# Patient Record
Sex: Male | Born: 1965 | Race: White | Hispanic: No | Smoking: Current some day smoker
Health system: Southern US, Community
[De-identification: ages and names within clinical notes are randomized; demographics above are authoritative.]

## PROBLEM LIST (undated history)

## (undated) DIAGNOSIS — I209 Angina pectoris, unspecified: Secondary | ICD-10-CM

## (undated) DIAGNOSIS — F419 Anxiety disorder, unspecified: Secondary | ICD-10-CM

## (undated) DIAGNOSIS — I10 Essential (primary) hypertension: Secondary | ICD-10-CM

## (undated) DIAGNOSIS — E237 Disorder of pituitary gland, unspecified: Secondary | ICD-10-CM

## (undated) DIAGNOSIS — E785 Hyperlipidemia, unspecified: Secondary | ICD-10-CM

## (undated) DIAGNOSIS — F1021 Alcohol dependence, in remission: Secondary | ICD-10-CM

## (undated) DIAGNOSIS — F32A Depression, unspecified: Secondary | ICD-10-CM

## (undated) DIAGNOSIS — T7840XA Allergy, unspecified, initial encounter: Secondary | ICD-10-CM

## (undated) DIAGNOSIS — K648 Other hemorrhoids: Secondary | ICD-10-CM

## (undated) DIAGNOSIS — F329 Major depressive disorder, single episode, unspecified: Secondary | ICD-10-CM

## (undated) DIAGNOSIS — I219 Acute myocardial infarction, unspecified: Secondary | ICD-10-CM

## (undated) DIAGNOSIS — I2510 Atherosclerotic heart disease of native coronary artery without angina pectoris: Secondary | ICD-10-CM

## (undated) DIAGNOSIS — E079 Disorder of thyroid, unspecified: Secondary | ICD-10-CM

## (undated) DIAGNOSIS — J449 Chronic obstructive pulmonary disease, unspecified: Secondary | ICD-10-CM

## (undated) DIAGNOSIS — M1990 Unspecified osteoarthritis, unspecified site: Secondary | ICD-10-CM

## (undated) DIAGNOSIS — M87052 Idiopathic aseptic necrosis of left femur: Secondary | ICD-10-CM

## (undated) DIAGNOSIS — M87051 Idiopathic aseptic necrosis of right femur: Secondary | ICD-10-CM

## (undated) DIAGNOSIS — I252 Old myocardial infarction: Secondary | ICD-10-CM

## (undated) HISTORY — DX: Hyperlipidemia, unspecified: E78.5

## (undated) HISTORY — DX: Unspecified osteoarthritis, unspecified site: M19.90

## (undated) HISTORY — PX: TONSILLECTOMY: SUR1361

## (undated) HISTORY — DX: Alcohol dependence, in remission: F10.21

## (undated) HISTORY — DX: Other hemorrhoids: K64.8

## (undated) HISTORY — PX: CARDIAC CATHETERIZATION: SHX172

## (undated) HISTORY — DX: Atherosclerotic heart disease of native coronary artery without angina pectoris: I25.10

## (undated) HISTORY — PX: CORONARY STENT PLACEMENT: SHX1402

## (undated) HISTORY — DX: Old myocardial infarction: I25.2

## (undated) HISTORY — DX: Chronic obstructive pulmonary disease, unspecified: J44.9

## (undated) HISTORY — DX: Allergy, unspecified, initial encounter: T78.40XA

---

## 2013-03-29 DIAGNOSIS — Z91199 Patient's noncompliance with other medical treatment and regimen due to unspecified reason: Secondary | ICD-10-CM

## 2013-03-29 DIAGNOSIS — Z9119 Patient's noncompliance with other medical treatment and regimen: Secondary | ICD-10-CM | POA: Insufficient documentation

## 2013-03-29 HISTORY — DX: Patient's noncompliance with other medical treatment and regimen due to unspecified reason: Z91.199

## 2013-04-06 DIAGNOSIS — E039 Hypothyroidism, unspecified: Secondary | ICD-10-CM | POA: Insufficient documentation

## 2014-06-28 DIAGNOSIS — I10 Essential (primary) hypertension: Secondary | ICD-10-CM | POA: Insufficient documentation

## 2014-12-23 ENCOUNTER — Emergency Department (HOSPITAL_COMMUNITY)
Admission: EM | Admit: 2014-12-23 | Discharge: 2014-12-23 | Disposition: A | Discharge status: Home / Self Care | Payer: Self-pay | Attending: Emergency Medicine | Admitting: Emergency Medicine

## 2014-12-23 ENCOUNTER — Encounter (HOSPITAL_COMMUNITY): Payer: Self-pay | Admitting: Emergency Medicine

## 2014-12-23 DIAGNOSIS — Z8639 Personal history of other endocrine, nutritional and metabolic disease: Secondary | ICD-10-CM | POA: Insufficient documentation

## 2014-12-23 DIAGNOSIS — R079 Chest pain, unspecified: Secondary | ICD-10-CM | POA: Insufficient documentation

## 2014-12-23 DIAGNOSIS — I10 Essential (primary) hypertension: Secondary | ICD-10-CM | POA: Insufficient documentation

## 2014-12-23 DIAGNOSIS — I2510 Atherosclerotic heart disease of native coronary artery without angina pectoris: Secondary | ICD-10-CM | POA: Insufficient documentation

## 2014-12-23 DIAGNOSIS — R42 Dizziness and giddiness: Secondary | ICD-10-CM | POA: Insufficient documentation

## 2014-12-23 DIAGNOSIS — R55 Syncope and collapse: Secondary | ICD-10-CM | POA: Insufficient documentation

## 2014-12-23 DIAGNOSIS — R001 Bradycardia, unspecified: Secondary | ICD-10-CM | POA: Insufficient documentation

## 2014-12-23 DIAGNOSIS — Z720 Tobacco use: Secondary | ICD-10-CM | POA: Insufficient documentation

## 2014-12-23 DIAGNOSIS — Z9861 Coronary angioplasty status: Secondary | ICD-10-CM | POA: Insufficient documentation

## 2014-12-23 HISTORY — DX: Atherosclerotic heart disease of native coronary artery without angina pectoris: I25.10

## 2014-12-23 HISTORY — DX: Disorder of pituitary gland, unspecified: E23.7

## 2014-12-23 HISTORY — DX: Disorder of thyroid, unspecified: E07.9

## 2014-12-23 HISTORY — DX: Essential (primary) hypertension: I10

## 2014-12-23 LAB — I-STAT TROPONIN, ED
Troponin i, poc: 0 ng/mL (ref 0.00–0.08)
Troponin i, poc: 0 ng/mL (ref 0.00–0.08)

## 2014-12-23 LAB — CBC
HEMATOCRIT: 35.1 % — AB (ref 39.0–52.0)
HEMOGLOBIN: 12.3 g/dL — AB (ref 13.0–17.0)
MCH: 34.9 pg — AB (ref 26.0–34.0)
MCHC: 35 g/dL (ref 30.0–36.0)
MCV: 99.7 fL (ref 78.0–100.0)
Platelets: 169 10*3/uL (ref 150–400)
RBC: 3.52 MIL/uL — ABNORMAL LOW (ref 4.22–5.81)
RDW: 15.6 % — ABNORMAL HIGH (ref 11.5–15.5)
WBC: 5.2 10*3/uL (ref 4.0–10.5)

## 2014-12-23 LAB — I-STAT CHEM 8, ED
BUN: 18 mg/dL (ref 6–23)
CREATININE: 1.1 mg/dL (ref 0.50–1.35)
Calcium, Ion: 1.09 mmol/L — ABNORMAL LOW (ref 1.12–1.23)
Chloride: 102 mEq/L (ref 96–112)
Glucose, Bld: 91 mg/dL (ref 70–99)
HCT: 41 % (ref 39.0–52.0)
HEMOGLOBIN: 13.9 g/dL (ref 13.0–17.0)
Potassium: 3.3 mmol/L — ABNORMAL LOW (ref 3.5–5.1)
Sodium: 135 mmol/L (ref 135–145)
TCO2: 21 mmol/L (ref 0–100)

## 2014-12-23 MED ORDER — POTASSIUM CHLORIDE CRYS ER 20 MEQ PO TBCR
20.0000 meq | EXTENDED_RELEASE_TABLET | Freq: Once | ORAL | Status: AC
  Administered 2014-12-23: 20 meq via ORAL
  Filled 2014-12-23: qty 1

## 2014-12-23 NOTE — Discharge Instructions (Signed)
Near-Syncope Near-syncope (commonly known as near fainting) is sudden weakness, dizziness, or feeling like you might pass out. During an episode of near-syncope, you may also develop pale skin, have tunnel vision, or feel sick to your stomach (nauseous). Near-syncope may occur when getting up after sitting or while standing for a long time. It is caused by a sudden decrease in blood flow to the brain. This decrease can result from various causes or triggers, most of which are not serious. However, because near-syncope can sometimes be a sign of something serious, a medical evaluation is required. The specific cause is often not determined. HOME CARE INSTRUCTIONS  Monitor your condition for any changes. The following actions may help to alleviate any discomfort you are experiencing:  Have someone stay with you until you feel stable.  Lie down right away and prop your feet up if you start feeling like you might faint. Breathe deeply and steadily. Wait until all the symptoms have passed. Most of these episodes last only a few minutes. You may feel tired for several hours.   Drink enough fluids to keep your urine clear or pale yellow.   If you are taking blood pressure or heart medicine, get up slowly when seated or lying down. Take several minutes to sit and then stand. This can reduce dizziness.  Follow up with your health care provider as directed. SEEK IMMEDIATE MEDICAL CARE IF:   You have a severe headache.   You have unusual pain in the chest, abdomen, or back.   You are bleeding from the mouth or rectum, or you have black or tarry stool.   You have an irregular or very fast heartbeat.   You have repeated fainting or have seizure-like jerking during an episode.   You faint when sitting or lying down.   You have confusion.   You have difficulty walking.   You have severe weakness.   You have vision problems.  MAKE SURE YOU:   Understand these instructions.  Will  watch your condition.  Will get help right away if you are not doing well or get worse. Document Released: 12/14/2005 Document Revised: 12/19/2013 Document Reviewed: 05/19/2013 Exit[employer] Patient Information 2015 [employer]. This information is not intended to replace advice given to you by your health care provider. Make sure you discuss any questions you have with your health care provider.  

## 2014-12-23 NOTE — ED Provider Notes (Signed)
CSN: [id number]     Arrival date & time 12/23/14  4081 History   First MD Initiated Contact with Patient 12/23/14 [phone]     Chief Complaint  Patient presents with  . Near Syncope      Patient is a 48 y.o. male presenting with near-syncope. The history is provided by the patient. No language interpreter was used.  Near Syncope   Kurt [name] presents for evaluation of near syncope. He felt tired yesterday went to bed about 9:30 PM. He had difficulty sleeping. He then developed central chest pressure, dizziness, and felt like he would pass out as well as some nausea. He called EMS and was brought to the emergency department for further evaluation. He denies any shortness of breath. His chest pain is gone. No vomiting, no abdominal pain, no fevers. He has a history of MI with stents placed 10-12 years ago. He also has a history of high blood pressure. He reports feeling improved since EMS was called.  Past Medical History  Diagnosis Date  . Coronary artery disease   . Hypertension   . Pituitary abnormality   . Thyroid disease    Past Surgical History  Procedure Laterality Date  . Coronary stent placement     No family history on file. History  Substance Use Topics  . Smoking status: Current Some Day Smoker  . Smokeless tobacco: Not on file  . Alcohol Use: Yes    Review of Systems  Cardiovascular: Positive for near-syncope.  All other systems reviewed and are negative.     Allergies  Erythromycin and Peanut-containing drug products  Home Medications   Prior to Admission medications   Not on File   BP 105/57 mmHg  Pulse 50  Temp(Src) 97.8 F (36.6 C)  Resp 19  SpO2 97% Physical Exam  Constitutional: He is oriented to person, place, and time. He appears well-developed and well-nourished.  HENT:  Head: Normocephalic and atraumatic.  Cardiovascular: Regular rhythm.   No murmur heard. bradycardic  Pulmonary/Chest: Effort normal. No respiratory distress.   Abdominal: Soft. There is no tenderness. There is no rebound and no guarding.  Musculoskeletal: He exhibits no edema or tenderness.  Neurological: He is alert and oriented to person, place, and time.  Skin: Skin is warm and dry.  Psychiatric: He has a normal mood and affect. His behavior is normal.  Nursing note and vitals reviewed.   ED Course  Procedures (including critical care time) Labs Review Labs Reviewed  CBC - Abnormal; Notable for the following:    RBC 3.52 (*)    Hemoglobin 12.3 (*)    HCT 35.1 (*)    MCH 34.9 (*)    RDW 15.6 (*)    All other components within normal limits  I-STAT CHEM 8, ED - Abnormal; Notable for the following:    Potassium 3.3 (*)    Calcium, Ion 1.09 (*)    All other components within normal limits  I-STAT TROPOININ, ED  I-STAT TROPOININ, ED    Imaging Review No results found.   EKG Interpretation   Date/Time:  Sunday December 23 2014 04:47:50 EST Ventricular Rate:  49 PR Interval:  130 QRS Duration: 121 QT Interval:  560 QTC Calculation: 506 R Axis:   98 Text Interpretation:  Sinus bradycardia Nonspecific intraventricular  conduction delay Abnrm T, consider ischemia, anterolateral lds Confirmed  by Hazle Coca [phone]) on 12/23/2014 4:54:05 AM      MDM   Final diagnoses:  Near syncope  Patient here for evaluation of near syncope, episode chest pain resolved. Patient was hypotensive for EMS. Clinical picture is not consistent with ACS, there are no prior EKGs to compare. Patient has no chest pain in the emergency department. Doubt PE or AAA. Patient without respiratory symptoms, no abdominal pain or tenderness. On recheck patient does remark that he had recent diarrhea with 6 episodes of loose stools a day for the last week. Question some element of dehydration. Discussed with patient mild electrolyte abnormalities and recommend taking multivitamin and by mouth fluid hydration. Recommend holding blood pressure medication at this  time, with frequent blood pressure checks at home.  Discussed with patient importance of PCP follow-up and return precautions.    Quintella Reichert, MD 12/23/14 [phone]

## 2014-12-23 NOTE — ED Notes (Signed)
Per EMS: Pt coming from for near syncope. Reports he was was lying in bed, woke up feeling nauseated, walked out side but felt like he was going to faint. BP upon EMS arrival 80/60's. Received 749ml of NS and 8 mf of zofran en route. BP improved with fluid bolus. Pt currently Ax4, NAD at this time. Pt did initially feel nauseated and was pale.

## 2014-12-23 NOTE — ED Notes (Signed)
He is warm and dry. Only complains of being tired. States he has had diarrhea for the past few days and not sleeping well. Denies any pain at this time . Sinus brady on monitor.

## 2015-07-31 ENCOUNTER — Encounter: Payer: Self-pay | Admitting: Primary Care

## 2015-07-31 ENCOUNTER — Ambulatory Visit (INDEPENDENT_AMBULATORY_CARE_PROVIDER_SITE_OTHER): Payer: Managed Care, Other (non HMO) | Admitting: Primary Care

## 2015-07-31 ENCOUNTER — Encounter (INDEPENDENT_AMBULATORY_CARE_PROVIDER_SITE_OTHER): Payer: Self-pay

## 2015-07-31 VITALS — BP 126/76 | HR 74 | Temp 97.4°F | Ht 72.0 in | Wt 147.8 lb

## 2015-07-31 DIAGNOSIS — E349 Endocrine disorder, unspecified: Secondary | ICD-10-CM | POA: Insufficient documentation

## 2015-07-31 DIAGNOSIS — Z0000 Encounter for general adult medical examination without abnormal findings: Secondary | ICD-10-CM | POA: Insufficient documentation

## 2015-07-31 DIAGNOSIS — F419 Anxiety disorder, unspecified: Secondary | ICD-10-CM | POA: Insufficient documentation

## 2015-07-31 DIAGNOSIS — Z8639 Personal history of other endocrine, nutritional and metabolic disease: Secondary | ICD-10-CM | POA: Diagnosis not present

## 2015-07-31 DIAGNOSIS — I10 Essential (primary) hypertension: Secondary | ICD-10-CM

## 2015-07-31 DIAGNOSIS — Z0001 Encounter for general adult medical examination with abnormal findings: Secondary | ICD-10-CM | POA: Insufficient documentation

## 2015-07-31 DIAGNOSIS — F329 Major depressive disorder, single episode, unspecified: Secondary | ICD-10-CM

## 2015-07-31 DIAGNOSIS — M79605 Pain in left leg: Secondary | ICD-10-CM | POA: Diagnosis not present

## 2015-07-31 DIAGNOSIS — F32A Depression, unspecified: Secondary | ICD-10-CM | POA: Insufficient documentation

## 2015-07-31 DIAGNOSIS — E291 Testicular hypofunction: Secondary | ICD-10-CM | POA: Diagnosis not present

## 2015-07-31 MED ORDER — SERTRALINE HCL 50 MG PO TABS
50.0000 mg | ORAL_TABLET | Freq: Every day | ORAL | Status: DC
Start: 2015-07-31 — End: 2015-10-18

## 2015-07-31 NOTE — Assessment & Plan Note (Signed)
History of. Once managed on Zoloft, has not had in 90+ days due to rehab and inability to afford. PHQ-9 score of 15 today. Restarted Zoloft at 50 mg. 1/2 tab for 6 days, then 1 full tab therafter. Discussed possible side effects. He is to follow up next week for physical, then in 6 weeks.

## 2015-07-31 NOTE — Assessment & Plan Note (Signed)
Present to left groin and upper part of lower extremity x 4 months. Temporary relief with ibuprofen, ice, heat. He lifts heavy equipment for work. Referral made to PT for further evaluation.

## 2015-07-31 NOTE — Assessment & Plan Note (Signed)
Endorses history of and was managed on levothyroixine 175 mcg, but has not had in 90+ days. TSH check today. Will treat accordingly.

## 2015-07-31 NOTE — Assessment & Plan Note (Signed)
He is to obtain labs today and come in next week for a physical.

## 2015-07-31 NOTE — Progress Notes (Signed)
Subjective:    Patient ID: Kurt [name], male    DOB: September 25, 1966, 49 y.o.   MRN: [id number]  HPI  Mr. Durr is a 49 year old male who presents today to establish care and discuss the problems mentioned below. Will obtain old records. He was in rehab for alcohol abuse for 90 days and has not had his medications in at least 90 days.  1) Essential hypertension: History of. Was managed on lotrel 10/40mg  and lopressor 50mg  BID. Removed from his medication while in rehab. He's also lost 40 pounds in three months.  2) Hyperlipidemia: History of. Was managed on Zetia 10 mg daily and Fish Oil daily.  3) Hypothyroidism: History of. Once managed on Levothyroxine 175 mcg. He's been out of his medication for 90 days due to insurance purposes and is experiencing weight loss, fatigue.  4) Heart disease: History of myocardial infarction in 1990's with 2 STENT placements. He does not currently follow with cardiology.  5) Depression: Diagnosed years ago. He was managed on Zoloft for years and has been out of his medications for 90+ days. PHQ-9 score of 15 in clinic today. He feels depressed, denies SI/HI.  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   7) Leg pain: Present to left leg near groin for the past 4 month. He was walking up stairs one afternoon and noticed a tear. He continues to experience pain when moving his leg up and walking up stairs. He works for a Manufacturing systems engineer. He's been taking ibuprofen, icing, placing heat without consistent relief.  Review of Systems  Constitutional: Positive for unexpected weight change.  HENT: Negative for rhinorrhea.   Respiratory: Negative for cough and shortness of breath.   Cardiovascular: Negative for chest pain.  Gastrointestinal: Negative for diarrhea and constipation.  Genitourinary: Negative for difficulty  urinating.  Musculoskeletal: Positive for arthralgias.       Present to fingers, knees, elbows.  Skin: Negative for rash.  Allergic/Immunologic: Positive for environmental allergies.       Spring and Fall.  Neurological: Negative for dizziness and headaches.  Psychiatric/Behavioral:       See HPI       Past Medical History  Diagnosis Date  . Coronary artery disease   . Hypertension   . Pituitary abnormality   . Thyroid disease   . 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History   Social History  . Marital Status: Legally Separated    Spouse Name: N/A  . Number of Children: N/A  . Years of Education: N/A   Occupational History  . Not on file.   Social History Main Topics  . Smoking status: Former Research scientist (life sciences)  . Smokeless tobacco: Not on file  . Alcohol Use: No  . Drug Use: Not on file  . Sexual Activity: Not on file   Other Topics Concern  . Not on file   Social History Narrative   Separated.   Works as a Optician, dispensing.   Enjoys fishing, Proofreader, golfing.       Past Surgical History  Procedure Laterality Date  . Coronary stent placement      No family history on file.  Allergies  Allergen Reactions  . Peanut-Containing Drug Products Anaphylaxis  . Erythromycin Other (See Comments)    Childhood allergy     No current outpatient prescriptions on file  prior to visit.   No current facility-administered medications on file prior to visit.    BP 126/76 mmHg  Pulse 74  Temp(Src) 97.4 F (36.3 C) (Oral)  Ht 6' (1.829 m)  Wt 147 lb 12.8 oz (67.042 kg)  BMI 20.04 kg/m2  SpO2 98%    Objective:   Physical Exam  Constitutional: He is oriented to person, place, and time. He appears well-nourished.  HENT:  Head: Normocephalic.  Cardiovascular: Normal rate and regular rhythm.   Pulmonary/Chest: Effort normal and breath sounds normal.  Musculoskeletal: He exhibits no edema.  Pain to left groin/upper part of lower extremity with flexion of  left leg. Difficulty stepping up on exam table.  Neurological: He is alert and oriented to person, place, and time.  Skin: Skin is warm and dry.  Psychiatric: He has a normal mood and affect.          Assessment & Plan:

## 2015-07-31 NOTE — Assessment & Plan Note (Signed)
Endorses history of. Will obtain testosterone lab today. Will refer to urology PRN.

## 2015-07-31 NOTE — Patient Instructions (Signed)
Complete lab work prior to leaving today. I will notify you of your results.  Please schedule a physical with me next week at your convienence. We will discuss your lab results during your physical.  You will be contacted regarding your referral to Physical Therapy.  Please let us know if you have not heard back within one week.   Start Zoloft 50 mg tablets for depression. Take 1/2 tablet by mouth daily for 6 days, then advance to 1 full tablet there after.  It was a pleasure to meet you today! Please don't hesitate to call me with any questions. Welcome to [facility]!

## 2015-07-31 NOTE — Progress Notes (Signed)
Pre visit review using our clinic review tool, if applicable. No additional management support is needed unless otherwise documented below in the visit note. 

## 2015-08-01 ENCOUNTER — Other Ambulatory Visit: Payer: Self-pay | Admitting: Primary Care

## 2015-08-01 ENCOUNTER — Telehealth: Payer: Self-pay | Admitting: Primary Care

## 2015-08-01 ENCOUNTER — Encounter: Payer: Self-pay | Admitting: *Deleted

## 2015-08-01 DIAGNOSIS — E349 Endocrine disorder, unspecified: Secondary | ICD-10-CM

## 2015-08-01 LAB — LIPID PANEL
Cholesterol: 160 mg/dL (ref 0–200)
HDL: 68.8 mg/dL (ref >39.00)
LDL Cholesterol: 75 mg/dL (ref 0–99)
NonHDL: 91.47
Total CHOL/HDL Ratio: 2
Triglycerides: 81 mg/dL (ref 0.0–149.0)
VLDL: 16.2 mg/dL (ref 0.0–40.0)

## 2015-08-01 LAB — COMPREHENSIVE METABOLIC PANEL
ALBUMIN: 3.8 g/dL (ref 3.5–5.2)
ALK PHOS: 69 U/L (ref 39–117)
ALT: 16 U/L (ref 0–53)
AST: 31 U/L (ref 0–37)
BUN: 14 mg/dL (ref 6–23)
CO2: 28 mEq/L (ref 19–32)
CREATININE: 0.84 mg/dL (ref 0.40–1.50)
Calcium: 9.2 mg/dL (ref 8.4–10.5)
Chloride: 99 mEq/L (ref 96–112)
GFR: 102.47 mL/min (ref >60.00)
Glucose, Bld: 59 mg/dL — ABNORMAL LOW (ref 70–99)
Potassium: 3.8 mEq/L (ref 3.5–5.1)
SODIUM: 135 meq/L (ref 135–145)
Total Bilirubin: 0.2 mg/dL (ref 0.2–1.2)
Total Protein: 7.3 g/dL (ref 6.0–8.3)

## 2015-08-01 LAB — TSH: TSH: 2.29 u[IU]/mL (ref 0.35–4.50)

## 2015-08-01 LAB — TESTOSTERONE: Testosterone: 177.05 ng/dL — ABNORMAL LOW (ref 300.00–890.00)

## 2015-08-01 LAB — HEMOGLOBIN A1C: HEMOGLOBIN A1C: 5.5 % (ref 4.6–6.5)

## 2015-08-01 NOTE — Telephone Encounter (Signed)
Called and notified patient of Kate's comments. Patient verbalized understanding.  

## 2015-08-01 NOTE — Telephone Encounter (Signed)
Pt needs work note for missing work yesterday.  Also pt is inquiring to see if labs are ready. Call cell number if any questions.Pt states he will stop by at 430 to pick up note  Thanks

## 2015-08-07 ENCOUNTER — Ambulatory Visit (INDEPENDENT_AMBULATORY_CARE_PROVIDER_SITE_OTHER): Payer: Managed Care, Other (non HMO) | Admitting: Primary Care

## 2015-08-07 ENCOUNTER — Encounter: Payer: Self-pay | Admitting: Primary Care

## 2015-08-07 VITALS — BP 136/84 | HR 62 | Temp 98.4°F | Ht 72.0 in | Wt 149.1 lb

## 2015-08-07 DIAGNOSIS — M79605 Pain in left leg: Secondary | ICD-10-CM | POA: Diagnosis not present

## 2015-08-07 DIAGNOSIS — E039 Hypothyroidism, unspecified: Secondary | ICD-10-CM | POA: Diagnosis not present

## 2015-08-07 DIAGNOSIS — Z0000 Encounter for general adult medical examination without abnormal findings: Secondary | ICD-10-CM

## 2015-08-07 DIAGNOSIS — E291 Testicular hypofunction: Secondary | ICD-10-CM | POA: Diagnosis not present

## 2015-08-07 DIAGNOSIS — E349 Endocrine disorder, unspecified: Secondary | ICD-10-CM

## 2015-08-07 NOTE — Progress Notes (Signed)
Subjective:    Patient ID: Kurt [name], male    DOB: 06-Oct-1966, 49 y.o.   MRN: [id number]  HPI  Kurt [name] is a 49 year old male who presents today for complete physical.  Immunizations: -Tetanus: Completed in 2011. -Influenza: Did not receive last season -Pneumonia: Pneumovax received in 2009   Diet: Endorses a fair diet. Breakfast: Biscuit, waffles. Lunch: Sandwich, banana Dinner: Meat, vegetable, starch. Cooks meals at home. Limited dessert eater. Beverages: Drinks water, gatorade. He is worried about his weight loss, as he has lost 30 pounds in the last several months. He has a weight gain of 2 pounds since last visit and is wearing the same type of clothing. Exercise: Active at work, does not regularly exercise. Eye exam: Last completed December 2015. Had glaucoma testing which was negative. Dental exam: Has not completed. Colonoscopy: Last completed 6 years ago. Had some polyps removed which were benign.   Wt Readings from Last 3 Encounters:  08/07/15 149 lb 1.9 oz (67.64 kg)  07/31/15 147 lb 12.8 oz (67.042 kg)    Prior smoker for 35 years. Smoked 2 PPD.  Quit in January 2016.  Review of Systems  Constitutional: Negative for unexpected weight change.  HENT: Negative for rhinorrhea.   Respiratory: Negative for shortness of breath.   Cardiovascular: Negative for chest pain.  Gastrointestinal: Negative for diarrhea and constipation.  Genitourinary: Negative for difficulty urinating.       Low testosterone   Musculoskeletal: Positive for myalgias. Negative for arthralgias.       Improvement in his leg pain  Skin: Negative for rash.  Neurological: Negative for dizziness and headaches.  Psychiatric/Behavioral:       Currently being treated for depression.       Past Medical History  Diagnosis Date  . Coronary artery disease   . Hypertension   . Pituitary abnormality   . Thyroid disease   . 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Social History    Social History  . Marital Status: Legally Separated    Spouse Name: N/A  . Number of Children: N/A  . Years of Education: N/A   Occupational History  . Not on file.   Social History Main Topics  . Smoking status: Former Research scientist (life sciences)  . Smokeless tobacco: Not on file  . Alcohol Use: No  . Drug Use: Not on file  . Sexual Activity: Not on file   Other Topics Concern  . Not on file   Social History Narrative   Separated.   Works as a Optician, dispensing.   Enjoys fishing, Proofreader, golfing.       Past Surgical History  Procedure Laterality Date  . Coronary stent placement      No family history on file.  Allergies  Allergen Reactions  . Peanut-Containing Drug Products Anaphylaxis  . Erythromycin Other (See Comments)    Childhood allergy     Current Outpatient Prescriptions on File Prior to Visit  Medication Sig Dispense Refill  . sertraline (ZOLOFT) 50 MG tablet Take 1 tablet (50 mg total) by mouth daily. 30 tablet 3   No current facility-administered medications on file prior to visit.    BP 136/84 mmHg  Pulse 62  Temp(Src) 98.4 F (36.9 C) (Oral)  Ht 6' (1.829 m)  Wt 149 lb 1.9 oz (67.64 kg)  BMI 20.22 kg/m2  SpO2 98%    Objective:   Physical Exam  Constitutional: He is oriented to person, place, and time. He appears  well-nourished.  HENT:  Right Ear: Tympanic membrane and ear canal normal.  Left Ear: Tympanic membrane and ear canal normal.  Nose: Nose normal.  Mouth/Throat: Oropharynx is clear and moist.  Eyes: Conjunctivae and EOM are normal. Pupils are equal, round, and reactive to light.  Neck: Neck supple. No thyromegaly present.  Cardiovascular: Normal rate and regular rhythm.   Pulmonary/Chest: Effort normal and breath sounds normal.  Abdominal: Soft. Bowel sounds are normal. There is no tenderness.  Musculoskeletal: Normal range of motion.  Lymphadenopathy:    He has no cervical adenopathy.  Neurological: He is alert and oriented to  person, place, and time. He has normal reflexes. No cranial nerve deficit.  Skin: Skin is warm and dry.  Psychiatric: He has a normal mood and affect.          Assessment & Plan:

## 2015-08-07 NOTE — Progress Notes (Signed)
Pre visit review using our clinic review tool, if applicable. No additional management support is needed unless otherwise documented below in the visit note. 

## 2015-08-07 NOTE — Assessment & Plan Note (Signed)
Tetanus and pneumonia UTD. Labs mostly unremarkable. Testosterone levels low, will refer to urology for further evaluation. Exam unremarkable. Colonoscopy completed 6 years ago through [facility].  Will continue to monitor weight as he's lost 30 pounds in several months.

## 2015-08-07 NOTE — Assessment & Plan Note (Signed)
TSH is within normal range. No thyroidmegly noted on exam.

## 2015-08-07 NOTE — Patient Instructions (Signed)
Stop by the front and speak with Rosaria Ferries regarding your referral to Urology.  Follow up in 2 months for re-evaluation of depression and weight.  It was a pleasure to see you today!

## 2015-08-07 NOTE — Assessment & Plan Note (Signed)
Improved with supportive measures

## 2015-08-07 NOTE — Assessment & Plan Note (Signed)
Testosterone level of 177 at 4pm.  He was not available to do an AM catch. Will send to urology for further evaluation.

## 2015-08-13 ENCOUNTER — Ambulatory Visit: Payer: Self-pay | Admitting: Primary Care

## 2015-09-12 ENCOUNTER — Encounter: Payer: Self-pay | Admitting: Family Medicine

## 2015-09-12 ENCOUNTER — Encounter: Payer: Self-pay | Admitting: Primary Care

## 2015-09-12 ENCOUNTER — Ambulatory Visit (INDEPENDENT_AMBULATORY_CARE_PROVIDER_SITE_OTHER)
Admission: RE | Admit: 2015-09-12 | Discharge: 2015-09-12 | Disposition: A | Discharge status: Home / Self Care | Payer: Managed Care, Other (non HMO) | Source: Ambulatory Visit

## 2015-09-12 ENCOUNTER — Ambulatory Visit
Admission: RE | Admit: 2015-09-12 | Discharge: 2015-09-12 | Disposition: A | Discharge status: Home / Self Care | Payer: Managed Care, Other (non HMO) | Source: Ambulatory Visit | Attending: Family Medicine | Admitting: Family Medicine

## 2015-09-12 ENCOUNTER — Ambulatory Visit (INDEPENDENT_AMBULATORY_CARE_PROVIDER_SITE_OTHER): Payer: Managed Care, Other (non HMO) | Admitting: Primary Care

## 2015-09-12 ENCOUNTER — Ambulatory Visit (INDEPENDENT_AMBULATORY_CARE_PROVIDER_SITE_OTHER): Payer: Managed Care, Other (non HMO) | Admitting: Family Medicine

## 2015-09-12 VITALS — BP 111/74 | HR 95 | Temp 98.9°F | Ht 72.0 in | Wt 150.5 lb

## 2015-09-12 VITALS — BP 146/92 | HR 72 | Temp 98.6°F | Ht 72.0 in | Wt 148.0 lb

## 2015-09-12 DIAGNOSIS — M79606 Pain in leg, unspecified: Secondary | ICD-10-CM

## 2015-09-12 DIAGNOSIS — M25551 Pain in right hip: Secondary | ICD-10-CM

## 2015-09-12 DIAGNOSIS — M87052 Idiopathic aseptic necrosis of left femur: Secondary | ICD-10-CM

## 2015-09-12 DIAGNOSIS — M25552 Pain in left hip: Principal | ICD-10-CM

## 2015-09-12 DIAGNOSIS — M87051 Idiopathic aseptic necrosis of right femur: Secondary | ICD-10-CM | POA: Diagnosis not present

## 2015-09-12 DIAGNOSIS — Z125 Encounter for screening for malignant neoplasm of prostate: Secondary | ICD-10-CM

## 2015-09-12 DIAGNOSIS — M79605 Pain in left leg: Secondary | ICD-10-CM

## 2015-09-12 DIAGNOSIS — M2550 Pain in unspecified joint: Secondary | ICD-10-CM

## 2015-09-12 DIAGNOSIS — M6250 Muscle wasting and atrophy, not elsewhere classified, unspecified site: Secondary | ICD-10-CM

## 2015-09-12 DIAGNOSIS — I2510 Atherosclerotic heart disease of native coronary artery without angina pectoris: Secondary | ICD-10-CM

## 2015-09-12 DIAGNOSIS — M533 Sacrococcygeal disorders, not elsewhere classified: Secondary | ICD-10-CM | POA: Diagnosis not present

## 2015-09-12 DIAGNOSIS — R937 Abnormal findings on diagnostic imaging of other parts of musculoskeletal system: Secondary | ICD-10-CM

## 2015-09-12 DIAGNOSIS — R29898 Other symptoms and signs involving the musculoskeletal system: Secondary | ICD-10-CM

## 2015-09-12 DIAGNOSIS — I252 Old myocardial infarction: Secondary | ICD-10-CM

## 2015-09-12 DIAGNOSIS — R634 Abnormal weight loss: Secondary | ICD-10-CM

## 2015-09-12 HISTORY — DX: Old myocardial infarction: I25.2

## 2015-09-12 HISTORY — DX: Atherosclerotic heart disease of native coronary artery without angina pectoris: I25.10

## 2015-09-12 MED ORDER — PREDNISONE 20 MG PO TABS: ORAL_TABLET | ORAL | Status: DC

## 2015-09-12 NOTE — Assessment & Plan Note (Signed)
Present bilaterally now to upper inner thighs. Suspect tendonitis as he does heavy lifting daily during occupation. He was to follow up with PT but cancelled his appointment. He is adamant about seeing a specialist today or tomorrow. RX for prednisone provided to assist with discomfort. He was placed on Dr. Lillie Fragmin schedule later this afternoon, case discussed with Dr. Lorelei Pont.

## 2015-09-12 NOTE — Progress Notes (Signed)
Dr. Frederico Hamman T. Vanity Larsson, MD, [facility] [place], [address] Phone: [phone] Fax: [phone]  09/12/2015  Patient: Kurt [name], MRN: [id number], DOB: 19-Aug-1966, 49 y.o.  Primary Physician:  Sheral Flow, NP  Chief Complaint: Leg Pain  Subjective:   Dear Mrs. Clark:  Thank you for having me see Kurt [name] in consultation today at [facility] at [facility] for his problem with bilateral hip pain.  As you may recall, he is a 49 y.o. year old male with a history of a 35 pound weight loss in approximately the last year with progressively worsening left-sided true groin pain for at least the last 6 months.  On the right side, the patient has started to feel some traumatic pain within the last couple of weeks that is been extensive.  It has limited his ability to work over the last few days.  Historically, this is been a very strong gentleman, and he at only 6 feet tall could easily dunk basketball, and he would play competitive basketball within a few years ago and was quite active all of the time.  Historically, he has been a laborer, and he works in a Associate Professor yard where he routinely picks up greater than 100 pounds at a time in [employer].  There is no explanation for why the patient has lost 35 pounds and it is entirely unintentional.   He is seen a number of doctors here locally as well as in [place].  I do have some [facility] well as [facility], which were reviewed.  He is a smoker, and he has had a recent chest x-ray which was normal with the exception of some COPD type changes.  I do not have all of his other screening data, and we will have to discuss this with the patient's PCP to ensure care coordination, and he just turned 49 years old.   Hurts if rotating his hip at all in the true groin.  No numbness or tingling.  Minimal quad firing   Wt Readings from Last 3  Encounters:  09/12/15 150 lb 8 oz (68.266 kg)  09/12/15 148 lb (67.132 kg)  08/07/15 149 lb 1.9 oz (67.64 kg)    35 pounds, 2013 data from [facility] shows a weight in the 180's.   Patient Active Problem List   Diagnosis Date Noted  . History of MI (myocardial infarction) 09/12/2015  . Coronary disease 09/12/2015  . Depression 07/31/2015  . Hypothyroidism 07/31/2015  . Testosterone deficiency 07/31/2015  . Pain of left lower extremity 07/31/2015    Past Medical History  Diagnosis Date  . Coronary artery disease   . Hypertension   . Pituitary abnormality   . Thyroid disease   . 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. History of MI (myocardial infarction) 09/12/2015  . Coronary disease 09/12/2015    Past Surgical History  Procedure Laterality Date  . Coronary stent placement      Social History   Social History  . Marital Status: Legally Separated    Spouse Name: N/A  . Number of Children: N/A  . Years of Education: N/A   Occupational History  . Not on file.   Social History Main Topics  . Smoking status: Former Research scientist (life sciences)  . Smokeless tobacco: Never Used  . Alcohol Use: No  . Drug Use: Not on file  . Sexual Activity: Not on file   Other Topics Concern  .  Not on file   Social History Narrative   Separated.   Works as a Advertising account planner.   Enjoys fishing, Publishing copy, golfing.       No family history on file.  Allergies  Allergen Reactions  . Peanut-Containing Drug Products Anaphylaxis  . Erythromycin Other (See Comments)    Childhood allergy     Medication list reviewed and updated in full in [facility].   GEN: No fevers, chills. Nontoxic. Primarily MSK c/o today. 35 pound weight loss.  MSK: Detailed in the HPI GI: tolerating PO intake without difficulty Neuro: No numbness, parasthesias, or tingling associated. Otherwise, the pertinent positives and negatives are listed above and in the HPI, otherwise a full review of systems has been  reviewed and is negative unless noted positive.   Objective:   BP 111/74 mmHg  Pulse 95  Temp(Src) 98.9 F (37.2 C) (Oral)  Ht 6' (1.829 m)  Wt 150 lb 8 oz (68.266 kg)  BMI 20.41 kg/m2    GEN: WDWN, NAD, Non-toxic, Alert & Oriented x 3 HEENT: Atraumatic, Normocephalic.  Ears and Nose: No external deformity. CV: RRR, no m/g/r  PULM: Normal respiratory rate, no accessory muscle use. No wheezes, crackles or rhonchi  ABD: S, NT, ND, + BS, No rebound, No HSM  EXTR: No clubbing/cyanosis/edema NEURO: ANTALGIC GAIT, WALKING "STILT-LIKE" PSYCH: Normally interactive. Conversant. Not depressed or anxious appearing.  Calm demeanor.   HIP EXAM: SIDE: B ROM: Abduction, Flexion, Internal and External range of motion: the patient has approximately 30-35 of rotational movement on both sides.  Abduction is only moderately limited, but all of these are limited by terminal pain. Pain with terminal IROM and EROM: as above GTB: NT SLR: NEG Knees: No effusion FABER: cannot complete due to pain REVERSE FABER: NT, neg Str: flexion: 2/5 abduction: 3/5 adduction: 3/5 Strength testing non-tender   there is a massive amount of quadriceps wasting on both sides, significantly worse on the left.  There is minimal muscle left on the left side and is dramatically smaller compared to the patient's Size.  The patient cannot rise from a seated position without using his upper extremity to elevate him out of the chair.  The upper body appears to take virtually all of his weight.  At the knee, the patient has 4 out of 5 extension and 4 out of 5 flexion. At the ankle there is preserved 5/5 plantar and dorsiflexion and normal great toe extension.  From a sensory standpoint, there is no deficit. DP and PT pulses are intact. 2+  Radiology:  Dg Hip Unilat With Pelvis 2-3 Views Left  09/13/2015   CLINICAL DATA:  Hip pain.  No known injury.  Initial evaluation.  EXAM: DG HIP (WITH OR WITHOUT PELVIS) 2-3V LEFT   COMPARISON:  None.  FINDINGS: Degenerative changes lumbar spine and both hips. Mild increased density noted left femoral head. Avascular necrosis cannot be completely excluded. Similar findings to a lesser degree noted about the right femoral head. Aortoiliac atherosclerotic vascular calcification. Pelvic phleboliths.  IMPRESSION: 1. No evidence of fracture or dislocation.  2. Mild increased density about the left femoral head. Similar findings to a lesser degree noted about the right femoral head . Avascular necrosis cannot be excluded. MRI of the hips can be obtained for further evaluation.  3.  Aortoiliac atherosclerotic vascular disease.   Electronically Signed   By: Maisie Fus  Register   On: 09/13/2015 09:17   Dg Hip Unilat With Pelvis 2-3 Views Right  09/13/2015   CLINICAL DATA:  Bilateral hip pain. No known injury. Works lifting heavy [employer].  EXAM: DG HIP (WITH OR WITHOUT PELVIS) 2-3V RIGHT  COMPARISON:  None.  FINDINGS: There is no evidence of hip fracture or dislocation. There is no evidence of arthropathy or other focal bone abnormality.  IMPRESSION: Negative.   Electronically Signed   By: Rolm Baptise M.D.   On: 09/13/2015 09:16   [facility] CLINICAL DATA:  Fever, leg pain for several weeks now worsening.  EXAM: LEFT FEMUR 2 VIEWS  COMPARISON:  None.  FINDINGS: Slipped no acute fracture deformity or dislocation. Distal femur metaphyseal enchondroma versus bone infarct. No suspicious bony lesions. Mild vascular calcifications. Coarse calcification in the medial femoral soft tissues most consistent with myositis ossificans.  IMPRESSION: No acute osseous process.   Electronically Signed   By: Elon Alas M.D.   On: 07/29/2015 22:13  [facility] DATA CLINICAL DATA:  Fever, leg pain for several weeks now worsening.  EXAM: CHEST  2 VIEW  COMPARISON:  None.  FINDINGS: Cardiomediastinal silhouette is unremarkable, status post extent coronary artery stenting.  Mild chronic interstitial changes, increased lung volumes dominant pleural effusion or focal consolidation. No pneumothorax. Soft tissue planes and included osseous structures are nonsuspicious.  IMPRESSION: Findings of COPD without superimposed acute cardiopulmonary process.   Electronically Signed   By: Elon Alas M.D.   On: 07/29/2015 22:15  Results for orders placed or performed in visit on 09/12/15  ANA  Result Value Ref Range   Anit Nuclear Antibody(ANA) NEG NEGATIVE  Cyclic citrul peptide antibody, IgG  Result Value Ref Range   Cyclic Citrullin Peptide Ab 23 (H) Units  Rheumatoid factor  Result Value Ref Range   Rhuematoid fact SerPl-aCnc <10 <=14 IU/mL  High sensitivity CRP  Result Value Ref Range   CRP, High Sensitivity 94.380 (H) 0.000 - 5.000 mg/L  Sedimentation rate  Result Value Ref Range   Sed Rate 47 (H) 0 - 22 mm/hr  Hepatitis C antibody  Result Value Ref Range   HCV Ab NEGATIVE NEGATIVE  HIV antibody  Result Value Ref Range   HIV 1&2 Ab, 4th Generation NONREACTIVE NONREACTIVE  PSA, total and free  Result Value Ref Range   PSA 0.34 <=4.00 ng/mL   PSA, Free 0.20 ng/mL   PSA, Free Pct 59 >25 %     Assessment and Plan:   Avascular necrosis of bone of hip, left  Avascular necrosis of bone of hip, right  SI (sacroiliac) joint dysfunction  Hip pain, bilateral - Plan: DG HIP UNILAT WITH PELVIS 2-3 VIEWS RIGHT, DG HIP UNILAT WITH PELVIS 2-3 VIEWS LEFT, ANA, Cyclic citrul peptide antibody, IgG, Rheumatoid factor, High sensitivity CRP, Sedimentation rate  Polyarthralgia - Plan: ANA, Cyclic citrul peptide antibody, IgG, Rheumatoid factor, High sensitivity CRP, Sedimentation rate, Hepatitis C antibody, HIV antibody  Loss of weight - Plan: ANA, Cyclic citrul peptide antibody, IgG, Rheumatoid factor, High sensitivity CRP, Sedimentation rate, Hepatitis C antibody, HIV antibody  History of MI (myocardial infarction)  Leg weakness, bilateral - Plan:  ANA, Cyclic citrul peptide antibody, IgG, Rheumatoid factor, High sensitivity CRP, Sedimentation rate  Muscle wasting  Special screening for malignant neoplasm of prostate - Plan: PSA, total and free   Level of medical complexity is high  Highly concerning history.  35 pound weight loss.  The patient is essentially unable to fire his quadricep musculature and hip flexor musculature and his hip stabilizers now, and he is binding his upper  legs with power lifting type wraps to assist his altered ambulation.  Bilateral hip x-rays show sclerotic changes more likely consistent with bilateral avascular necrosis of the femoral head.  Additionally, I discussed this case with one of our musculoskeletal radiologists, and he felt like there was some sclerotic change around the left-sided pelvis near the SI joint as well.  To determine if this gentleman has indeed bilateral avascular necrosis of each femoral head, we will obtain an MRI of the bony pelvis to evaluate both hips as well as his pelvis for avascular necrosis.  PSA is negative.  Chest x-ray is negative.  Smoker.  35 pound weight loss, neoplastic disease would be considered in the differential.  Neuromuscular disease or wasting disorder would be also in the differential, but much less likely given XR findings. This may be explained simply by bilateral avascular necrosis for months. Patient also has a ESR of 47 and CRP of 94. Diffuse polyarthralgia, majority of Rheumatological studies normal with mildly elevated CCP AB, markedly elevated ESR and CRP likely explained by above. If symptoms persist in future, further eval could be undertaken.   I discussed this case and follow-up plan of care with PCP face to face.   Follow-up: will depend upon additional studies  Orders Placed This Encounter  Procedures  . DG HIP UNILAT WITH PELVIS 2-3 VIEWS RIGHT  . DG HIP UNILAT WITH PELVIS 2-3 VIEWS LEFT  . ANA  . Cyclic citrul peptide antibody, IgG  .  Rheumatoid factor  . High sensitivity CRP  . Sedimentation rate  . Hepatitis C antibody  . HIV antibody  . PSA, total and free    We will see the patient back determined by further testing.   Thank you for having [place] see Kurt [name] in consultation.  Feel free to contact me with any questions.  Signed,  Maud Deed. Alydia Gosser, MD   Patient's Medications  New Prescriptions   No medications on file  Previous Medications   PREDNISONE (DELTASONE) 20 MG TABLET    Take 3 tablets by mouth daily for 3 days, then 2 tablets by mouth daily for 3 days, then 1 tablet by mouth daily for 3 days.   SERTRALINE (ZOLOFT) 50 MG TABLET    Take 1 tablet (50 mg total) by mouth daily.  Modified Medications   No medications on file  Discontinued Medications   No medications on file

## 2015-09-12 NOTE — Progress Notes (Signed)
Pre visit review using our clinic review tool, if applicable. No additional management support is needed unless otherwise documented below in the visit note. 

## 2015-09-12 NOTE — Progress Notes (Signed)
   Subjective:    Patient ID: Kurt [name], male    DOB: 1966-05-13, 49 y.o.   MRN: [id number]  HPI  Kurt [name] is a 49 year old male who presents today with a chief complaint of leg pain. His pain is present to bilateral upper portion of lower extremities, near groin. He describes his pain as a "knife" that's cutting through his legs. He's been taking tylenol/motrin with temporarily decrease in pain. This pain has been on going on since March/April this year. Pain is worst with lifting heavy boxes at work and is constant. Denies back pain, numbness/tingling in lower extremities. He does have cramping to the plantar surface of his feet. He's not been able to work yesterday and today and would like immediate evaluation with a specialist. He was scheduled for PT in early August but cancelled his appointment.  Wt Readings from Last 3 Encounters:  09/12/15 148 lb (67.132 kg)  08/07/15 149 lb 1.9 oz (67.64 kg)  07/31/15 147 lb 12.8 oz (67.042 kg)     Review of Systems  Respiratory: Negative for shortness of breath.   Cardiovascular: Negative for chest pain.  Musculoskeletal: Positive for myalgias and arthralgias.  Skin: Negative for rash.  Neurological: Negative for dizziness, weakness and numbness.       Past Medical History  Diagnosis Date  . Coronary artery disease   . Hypertension   . Pituitary abnormality   . Thyroid disease   . 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Social History   Social History  . Marital Status: Legally Separated    Spouse Name: N/A  . Number of Children: N/A  . Years of Education: N/A   Occupational History  . Not on file.   Social History Main Topics  . Smoking status: Former Research scientist (life sciences)  . Smokeless tobacco: Not on file  . Alcohol Use: No  . Drug Use: Not on file  . Sexual Activity: Not on file   Other Topics Concern  . Not on file   Social History Narrative   Separated.   Works as a Optician, dispensing.   Enjoys fishing, Proofreader,  golfing.       Past Surgical History  Procedure Laterality Date  . Coronary stent placement      No family history on file.  Allergies  Allergen Reactions  . Peanut-Containing Drug Products Anaphylaxis  . Erythromycin Other (See Comments)    Childhood allergy     Current Outpatient Prescriptions on File Prior to Visit  Medication Sig Dispense Refill  . sertraline (ZOLOFT) 50 MG tablet Take 1 tablet (50 mg total) by mouth daily. 30 tablet 3   No current facility-administered medications on file prior to visit.    BP 146/92 mmHg  Pulse 72  Temp(Src) 98.6 F (37 C) (Oral)  Ht 6' (1.829 m)  Wt 148 lb (67.132 kg)  BMI 20.07 kg/m2  SpO2 98%    Objective:   Physical Exam  Constitutional: He appears well-nourished.  Cardiovascular: Normal rate and regular rhythm.   Pulmonary/Chest: Effort normal and breath sounds normal.  Musculoskeletal:  Decreased ROM and pain to bilateral medial thighs upon lateral rotation of lower extremity during flexion while supine. Non tender. No obvious deformity.          Assessment & Plan:

## 2015-09-12 NOTE — Patient Instructions (Signed)
Start Prednisone tablets for leg pain. Take 3 tablets by mouth daily for 3 days, then 2 tablet daily for 3 days, then 1 tablet for 3 days.  See Dr. Lorelei Pont at 5:45 pm this afternoon as scheduled.   It's important that you are scheduled for physical therapy. Please talk with Dr. Lorelei Pont and let me know what you guys decide.  It was a pleasure to see you today!

## 2015-09-13 ENCOUNTER — Telehealth: Payer: Self-pay | Admitting: Primary Care

## 2015-09-13 LAB — HIGH SENSITIVITY CRP: CRP HIGH SENSITIVITY: 94.38 mg/L — AB (ref 0.000–5.000)

## 2015-09-13 LAB — SEDIMENTATION RATE: Sed Rate: 47 mm/hr — ABNORMAL HIGH (ref 0–22)

## 2015-09-13 LAB — RHEUMATOID FACTOR

## 2015-09-13 NOTE — Telephone Encounter (Signed)
Pt was seen by Dr Lorelei Pont last night, said there was nothing wrong with his legs.  He thinks it is ALS or MS and to follow up with you.   cb number is [phone] Pt is extremely worried and would like call back as soon as possible

## 2015-09-13 NOTE — Telephone Encounter (Signed)
Attempted to return call, no answer, left voicemail . Will attempt again at the end of the day.

## 2015-09-13 NOTE — Telephone Encounter (Signed)
Pt called again, wondering if kate got a chance to look at message sent. He can be reached at the phone number [phone].

## 2015-09-14 LAB — PSA, TOTAL AND FREE
PSA FREE: 0.2 ng/mL
PSA, Free Pct: 59 % (ref >25)
PSA: 0.34 ng/mL (ref <=4.00)

## 2015-09-14 LAB — HEPATITIS C ANTIBODY: HCV Ab: NEGATIVE

## 2015-09-14 LAB — HIV ANTIBODY (ROUTINE TESTING W REFLEX): HIV: NONREACTIVE

## 2015-09-16 ENCOUNTER — Telehealth: Payer: Self-pay | Admitting: Primary Care

## 2015-09-16 LAB — CYCLIC CITRUL PEPTIDE ANTIBODY, IGG: CYCLIC CITRULLIN PEPTIDE AB: 23 U — AB

## 2015-09-16 LAB — ANA: Anti Nuclear Antibody(ANA): NEGATIVE

## 2015-09-16 NOTE — Telephone Encounter (Signed)
Discussed case with Dr. Lorelei Pont who will be in contact with patient.

## 2015-09-16 NOTE — Telephone Encounter (Signed)
Please notify Mr. Smolenski that Dr. Lorelei Pont will be in touch with him in the near future. Thanks.

## 2015-09-16 NOTE — Telephone Encounter (Signed)
Patient returned Kate's call.  Patient said he's out of work and will be available all afternoon.  Patient can be reached at [phone].

## 2015-09-16 NOTE — Telephone Encounter (Signed)
Called and notified patient of Kurt [name]'s comments. Patient verbalized understanding.  

## 2015-09-24 ENCOUNTER — Ambulatory Visit
Admission: RE | Admit: 2015-09-24 | Discharge: 2015-09-24 | Disposition: A | Discharge status: Home / Self Care | Payer: Managed Care, Other (non HMO) | Source: Ambulatory Visit | Attending: Family Medicine | Admitting: Family Medicine

## 2015-09-24 DIAGNOSIS — M25552 Pain in left hip: Secondary | ICD-10-CM | POA: Diagnosis present

## 2015-09-24 DIAGNOSIS — M87052 Idiopathic aseptic necrosis of left femur: Secondary | ICD-10-CM | POA: Diagnosis not present

## 2015-09-24 DIAGNOSIS — R937 Abnormal findings on diagnostic imaging of other parts of musculoskeletal system: Secondary | ICD-10-CM

## 2015-09-24 DIAGNOSIS — M533 Sacrococcygeal disorders, not elsewhere classified: Secondary | ICD-10-CM

## 2015-09-24 DIAGNOSIS — M25551 Pain in right hip: Secondary | ICD-10-CM | POA: Diagnosis present

## 2015-09-24 DIAGNOSIS — M87051 Idiopathic aseptic necrosis of right femur: Secondary | ICD-10-CM | POA: Diagnosis not present

## 2015-09-25 ENCOUNTER — Telehealth: Payer: Self-pay | Admitting: *Deleted

## 2015-09-25 ENCOUNTER — Other Ambulatory Visit: Payer: Self-pay | Admitting: Family Medicine

## 2015-09-25 DIAGNOSIS — M87052 Idiopathic aseptic necrosis of left femur: Principal | ICD-10-CM

## 2015-09-25 DIAGNOSIS — M87051 Idiopathic aseptic necrosis of right femur: Secondary | ICD-10-CM

## 2015-09-25 MED ORDER — ACETAMINOPHEN-CODEINE #3 300-30 MG PO TABS: 1.0000 | ORAL_TABLET | Freq: Four times a day (QID) | ORAL | Status: DC | PRN

## 2015-09-25 NOTE — Telephone Encounter (Signed)
Tylenol #3 called into [facility]

## 2015-09-25 NOTE — Telephone Encounter (Signed)
-----   Message from Owens Loffler, MD sent at 09/25/2015  8:43 AM EDT ----- Please call in  Tylenol #3, 1-2 po q 6 hours prn pain, #40, 0 refills

## 2015-09-26 ENCOUNTER — Other Ambulatory Visit: Payer: Self-pay | Admitting: Primary Care

## 2015-09-26 DIAGNOSIS — Z87891 Personal history of nicotine dependence: Secondary | ICD-10-CM

## 2015-09-26 DIAGNOSIS — R634 Abnormal weight loss: Secondary | ICD-10-CM

## 2015-09-27 NOTE — Telephone Encounter (Signed)
Pt request status of tylenol # 3 to [facility] [address]; spoke with Christy at [facility] and rx ready for pickup. Pt voiced understanding.

## 2015-09-30 ENCOUNTER — Telehealth: Payer: Self-pay | Admitting: Primary Care

## 2015-09-30 DIAGNOSIS — I252 Old myocardial infarction: Secondary | ICD-10-CM

## 2015-09-30 NOTE — Telephone Encounter (Signed)
Referral made to cardiology for surgical clearance.

## 2015-09-30 NOTE — Telephone Encounter (Signed)
Patient called and said he needs a Cardiac clearance for hip replacement surgery by [facility].  Patient said he saw a cardiologist in [place] several years ago, but he wants to be referred to a Arts administrator in [place].  He can go after 3:30 on Monday - Friday.

## 2015-10-03 ENCOUNTER — Ambulatory Visit
Admission: RE | Admit: 2015-10-03 | Discharge: 2015-10-03 | Disposition: A | Discharge status: Home / Self Care | Payer: Managed Care, Other (non HMO) | Source: Ambulatory Visit | Attending: Primary Care | Admitting: Primary Care

## 2015-10-03 DIAGNOSIS — R911 Solitary pulmonary nodule: Secondary | ICD-10-CM | POA: Insufficient documentation

## 2015-10-03 DIAGNOSIS — J432 Centrilobular emphysema: Secondary | ICD-10-CM | POA: Insufficient documentation

## 2015-10-03 DIAGNOSIS — Z87891 Personal history of nicotine dependence: Secondary | ICD-10-CM | POA: Insufficient documentation

## 2015-10-03 DIAGNOSIS — R634 Abnormal weight loss: Secondary | ICD-10-CM | POA: Diagnosis present

## 2015-10-03 MED ORDER — IOHEXOL 300 MG/ML  SOLN
75.0000 mL | Freq: Once | INTRAMUSCULAR | Status: AC | PRN
Start: 2015-10-03 — End: 2015-10-03
  Administered 2015-10-03: 75 mL via INTRAVENOUS

## 2015-10-04 ENCOUNTER — Telehealth: Payer: Self-pay

## 2015-10-04 NOTE — Telephone Encounter (Signed)
Allie Bossier NP said Dr Lorelei Pont is handling pain mgt and note forwarded to Dr Lorelei Pont.

## 2015-10-04 NOTE — Telephone Encounter (Signed)
This would be reasonable until the patient's surgery. He should not need any pain medication long-term. Upcoming hip surgery should be expected.   Please let him know that you can't call in stronger pain medication, but I will write for some on Monday morning.   Electronically Signed  By: Owens Loffler, MD On: 10/04/2015 4:59 PM

## 2015-10-04 NOTE — Telephone Encounter (Signed)
He will be contacted soon

## 2015-10-04 NOTE — Telephone Encounter (Signed)
Hagan notified as instructed by telephone.

## 2015-10-04 NOTE — Telephone Encounter (Signed)
Pt left v/m requesting cb when available with CT scan of chest done on 10/03/15.

## 2015-10-04 NOTE — Telephone Encounter (Signed)
Pt left v/m; pain med Tylenol # 3 helps but does not give complete relief from pain and pt wants to know if there is another option for pain mgt. Pt request cb.

## 2015-10-07 ENCOUNTER — Telehealth: Payer: Self-pay | Admitting: Primary Care

## 2015-10-07 MED ORDER — HYDROCODONE-ACETAMINOPHEN 5-325 MG PO TABS: 1.0000 | ORAL_TABLET | Freq: Four times a day (QID) | ORAL | Status: DC | PRN

## 2015-10-07 NOTE — Telephone Encounter (Signed)
Patient returned Chan's call.  Patient asked me to give him the results of his CT Chest.  I let him know what Anda Kraft said in the results.

## 2015-10-07 NOTE — Telephone Encounter (Signed)
See result note of CT.

## 2015-10-07 NOTE — Telephone Encounter (Signed)
Left message for Mr. Hardenbrook that his prescription is ready to be picked up at the front desk.

## 2015-10-08 ENCOUNTER — Ambulatory Visit: Payer: Managed Care, Other (non HMO) | Admitting: Primary Care

## 2015-10-09 ENCOUNTER — Telehealth: Payer: Self-pay | Admitting: Primary Care

## 2015-10-09 NOTE — Telephone Encounter (Signed)
What questions does he have specifically? I believe Dr. Lorelei Pont sent in some pain medication, is this what he's referring to?

## 2015-10-09 NOTE — Telephone Encounter (Signed)
Called and ask him what kind of questions does he have.  First, he wanted to see if he can get a referral to urology again as soon as possible. Patient lives with his father and the card that was mailed to his home did not have Sr. or Jr. on the it. So the patient's father assume it was his and call then cancel the appointment by mistake. Patient did not realized it until after. I went ahead and added Junior for patient in our system but it will need to be fix if he is going to the same urology office that he was first referral to.  Second, he is very concern about his weight. He is losing weight and right now, he is around 139.lbs. He is worry about this and how it would impact him with the upcoming surgery.  Third, he has been having diarrhea for the past 4 days. He has no energy and loss of appetite. He had been running a fever as well. He also have been having cramps and felt like it is in all his joints. All this had made him very concerns.

## 2015-10-09 NOTE — Telephone Encounter (Signed)
Pt stopped by to pick up an RX and wanted to know if someone could call him please. He has a lot of questions about his current condition. CB number [phone]

## 2015-10-10 ENCOUNTER — Telehealth: Payer: Self-pay | Admitting: Primary Care

## 2015-10-10 ENCOUNTER — Other Ambulatory Visit: Payer: Self-pay | Admitting: Primary Care

## 2015-10-10 DIAGNOSIS — E349 Endocrine disorder, unspecified: Secondary | ICD-10-CM

## 2015-10-10 NOTE — Telephone Encounter (Signed)
Spoke with patient regarding concerns. 

## 2015-10-18 ENCOUNTER — Ambulatory Visit (INDEPENDENT_AMBULATORY_CARE_PROVIDER_SITE_OTHER): Payer: Managed Care, Other (non HMO) | Admitting: Internal Medicine

## 2015-10-18 ENCOUNTER — Encounter: Payer: Self-pay | Admitting: Internal Medicine

## 2015-10-18 VITALS — BP 116/74 | HR 60 | Ht 72.0 in | Wt 148.2 lb

## 2015-10-18 DIAGNOSIS — I252 Old myocardial infarction: Secondary | ICD-10-CM

## 2015-10-18 DIAGNOSIS — Z01810 Encounter for preprocedural cardiovascular examination: Secondary | ICD-10-CM | POA: Diagnosis not present

## 2015-10-18 DIAGNOSIS — I2510 Atherosclerotic heart disease of native coronary artery without angina pectoris: Secondary | ICD-10-CM | POA: Diagnosis not present

## 2015-10-18 DIAGNOSIS — E785 Hyperlipidemia, unspecified: Secondary | ICD-10-CM

## 2015-10-18 DIAGNOSIS — I2583 Coronary atherosclerosis due to lipid rich plaque: Secondary | ICD-10-CM

## 2015-10-18 NOTE — Patient Instructions (Signed)
Your physician recommends that you schedule a follow-up appointment in: with Dr. Debara Pickett as needed.

## 2015-10-20 ENCOUNTER — Encounter: Payer: Self-pay | Admitting: Internal Medicine

## 2015-10-20 ENCOUNTER — Other Ambulatory Visit: Payer: Self-pay | Admitting: Internal Medicine

## 2015-10-20 DIAGNOSIS — E785 Hyperlipidemia, unspecified: Secondary | ICD-10-CM | POA: Insufficient documentation

## 2015-10-20 DIAGNOSIS — F1020 Alcohol dependence, uncomplicated: Secondary | ICD-10-CM | POA: Insufficient documentation

## 2015-10-20 DIAGNOSIS — I2510 Atherosclerotic heart disease of native coronary artery without angina pectoris: Secondary | ICD-10-CM | POA: Insufficient documentation

## 2015-10-20 DIAGNOSIS — F419 Anxiety disorder, unspecified: Secondary | ICD-10-CM | POA: Insufficient documentation

## 2015-10-20 NOTE — Progress Notes (Signed)
OFFICE NOTE  Chief Complaint:  Preoperative cardiovascular risk assessment  Primary Care Physician: Sheral Flow, NP  HPI:  Kurt [name]. is a pleasant 49 year old male who has a history of remote MI approximately 10 years ago in 2006 at [facility]. He received 2 overlapping stents, per his report. An echo was performed in 2009 which showed an EF of 55-60% with no wall motion abnormalities. A stress test at that time showed mild LV dilatation and EF of 48% but no definite reversible ischemia. It does not sound like he's had much cardiac follow-up since that time. He is currently on no cardiovascular medications including no aspirin. He denies any recent angina or worsening shortness of breath. He's recently worked on weight loss and in fact has remained fairly active. He does a lot of physical labor and says he is asymptomatic during that. He is reportedly going to have hip replacement by Dr. Percell Miller. He's here today for cardiovascular clearance.  PMHx:  Past Medical History  Diagnosis Date  . Coronary artery disease   . Hypertension   . 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 Thyroid disease   . 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([place])   . History of MI (myocardial infarction) 09/12/2015  . Coronary disease 09/12/2015  . Hyperlipidemia     Past Surgical History  Procedure Laterality Date  . Coronary stent placement      FAMHx:  Family History  Problem Relation Age of Onset  . Cancer Mother   . Dementia Father   . Cancer Maternal Grandmother   . Cancer Maternal Grandfather   . Cancer Paternal Grandmother   . Cancer Paternal Grandfather     SOCHx:   reports that he has quit smoking. He has never used smokeless tobacco. He reports that he does not drink alcohol. His drug history is not on file.  ALLERGIES:  Allergies  Allergen Reactions  . Peanut-Containing Drug Products Anaphylaxis  . Erythromycin Other (See Comments)    Childhood allergy     ROS: A  comprehensive review of systems was negative except for: Musculoskeletal: positive for Hip pain  HOME MEDS: Current Outpatient Prescriptions  Medication Sig Dispense Refill  . HYDROcodone-acetaminophen (NORCO/VICODIN) 5-325 MG tablet Take 1 tablet by mouth every 6 (six) hours as needed for moderate pain. 50 tablet 0   No current facility-administered medications for this visit.    LABS/IMAGING: No results found for this or any previous visit (from the past 48 hour(s)). No results found.  WEIGHTS: Wt Readings from Last 3 Encounters:  10/18/15 148 lb 3.2 oz (67.223 kg)  09/24/15 150 lb (68.04 kg)  09/12/15 150 lb 8 oz (68.266 kg)    VITALS: BP 116/74 mmHg  Pulse 60  Ht 6' (1.829 m)  Wt 148 lb 3.2 oz (67.223 kg)  BMI 20.10 kg/m2  EXAM: General appearance: alert and no distress Neck: no carotid bruit and no JVD Lungs: clear to auscultation bilaterally Heart: regular rate and rhythm, S1, S2 normal, no murmur, click, rub or gallop Abdomen: soft, non-tender; bowel sounds normal; no masses,  no organomegaly Extremities: extremities normal, atraumatic, no cyanosis or edema Pulses: 2+ and symmetric Skin: Skin color, texture, turgor normal. No rashes or lesions Neurologic: Grossly normal Psych: Pleasant  EKG: Normal sinus rhythm at 60, incomplete left bundle branch block  ASSESSMENT: 1. Coronary artery disease status post MI in 2006 with 2 overlapping stents 2. History of hypertension and dyslipidemia-not on medications 3. Documented medical noncompliance from [facility] 4.  Low risk for upcoming surgery  PLAN: 1.   Kurt [name] presents today for preoperative cardiac vascular risk assessment. He had a very remote MI about 10 years ago with 2 overlapping stents to an yet unknown vessel. He is physically active and denies any chest pain or worsening shortness of breath. He can easily do more than 4 metabolic equivalents of activity. He should be low risk for hip replacement  surgery. We discussed ongoing treatment and medications for coronary artery disease and the importance of medications to reduce his risk of future heart attacks. At minimum, I advised that he would start low-dose aspirin on a daily basis after his surgery. He does seem to have well-controlled blood pressure and heart rate at this time. It's hard to argue for the addition of beta blocker or ACE inhibitor, and one could consider at least a low-dose beta blocker perioperatively however his heart rate is around 60. Overall I think he is low risk for surgery. I'm happy to see him annually in follow-up.  Thanks for the kind referral.  Pixie Casino, MD, [facility] Attending Cardiologist [place] 10/20/2015, 12:21 PM

## 2015-11-12 ENCOUNTER — Other Ambulatory Visit: Payer: Self-pay | Admitting: Orthopedic Surgery

## 2015-11-13 ENCOUNTER — Other Ambulatory Visit: Payer: Self-pay

## 2015-11-13 NOTE — Telephone Encounter (Signed)
Pt left v/m requesting refill of a med (did not leave name) and wants to get hip surgery scheduled sooner due to hip popping out of joint. Left v/m for pt to cb.

## 2015-11-14 NOTE — Telephone Encounter (Signed)
Pt left v/m requesting status of med refill (no name left). Spoke with pt and he request rx for hydrocodone apap. Pt is out of med. Last printed # 50 on 10/07/15. Pt last seen 09/12/15.

## 2015-11-15 ENCOUNTER — Inpatient Hospital Stay (HOSPITAL_COMMUNITY)
Admission: RE | Admit: 2015-11-15 | Discharge: 2015-11-15 | Disposition: A | Discharge status: Home / Self Care | Payer: Managed Care, Other (non HMO) | Source: Ambulatory Visit

## 2015-11-15 MED ORDER — HYDROCODONE-ACETAMINOPHEN 5-325 MG PO TABS: 1.0000 | ORAL_TABLET | Freq: Four times a day (QID) | ORAL | Status: DC | PRN

## 2015-11-15 NOTE — Progress Notes (Addendum)
Attempted to contact Kurt [name] with out success. Called his work number, they state he left at 1100 today due to pain. Message left for him to call [facility] Moores or Baker Janus to reschedule his PAT appointment.

## 2015-11-15 NOTE — Telephone Encounter (Signed)
RX printed and signed and given to CS 

## 2015-11-15 NOTE — Pre-Procedure Instructions (Signed)
Kolvin Sargeant.  11/15/2015      [facility] [address], [place] - [address] [place] [address] Phone: [phone] Fax: [phone]    Your procedure is scheduled on Nov 29  Report to [facility] at 530 A.M.  Call this number if you have problems the morning of surgery:  [phone]   Remember:  Do not eat food or drink liquids after midnight.  Take these medicines the morning of surgery with A SIP OF WATER   Stop taking aspirin, ibuprofen, Aleve, Herbal medications, Fish Oil, BC's, Goody's   Do not wear jewelry, make-up or nail polish.  Do not wear lotions, powders, or perfumes.  You may wear deodorant.  Do not shave 48 hours prior to surgery.  Men may shave face and neck.  Do not bring valuables to the hospital.  [facility] is not responsible for any belongings or valuables.  Contacts, dentures or bridgework may not be worn into surgery.  Leave your suitcase in the car.  After surgery it may be brought to your room.  For patients admitted to the hospital, discharge time will be determined by your treatment team.  Patients discharged the day of surgery will not be allowed to drive home.    Special instructions:  [place] - Preparing for Surgery  Before surgery, you can play an important role.  Because skin is not sterile, your skin needs to be as free of germs as possible.  You can reduce the number of germs on you skin by washing with CHG (chlorahexidine gluconate) soap before surgery.  CHG is an antiseptic cleaner which kills germs and bonds with the skin to continue killing germs even after washing.  Please DO NOT use if you have an allergy to CHG or antibacterial soaps.  If your skin becomes reddened/irritated stop using the CHG and inform your nurse when you arrive at Short Stay.  Do not shave (including legs and underarms) for at least 48 hours prior to the first CHG shower.  You may shave your  face.  Please follow these instructions carefully:   1.  Shower with CHG Soap the night before surgery and the    morning of Surgery.  2.  If you choose to wash your hair, wash your hair first as usual with your normal shampoo.  3.  After you shampoo, rinse your hair and body thoroughly to remove the  Shampoo.  4.  Use CHG as you would any other liquid soap.  You can apply chg directly to the skin and wash gently with scrungie or a clean washcloth.  5.  Apply the CHG Soap to your body ONLY FROM THE NECK DOWN.        Do not use on open wounds or open sores.  Avoid contact with your eyes,ears, mouth and genitals (private parts).  Wash genitals (private parts)  with your normal soap.  6.  Wash thoroughly, paying special attention to the area where your surgery   will be performed.  7.  Thoroughly rinse your body with warm water from the neck down.  8.  DO NOT shower/wash with your normal soap after using and rinsing off  the CHG Soap.  9.  Pat yourself dry with a clean towel.            10.  Wear clean pajamas.            11.  Place clean  sheets on your bed the night of your first shower and do not sleep with pets.  Day of Surgery  Do not apply any lotions/deoderants the morning of surgery.  Please wear clean clothes to the hospital/surgery center.     Please read over the following fact sheets that you were given. Pain Booklet, Coughing and Deep Breathing, MRSA Information and Surgical Site Infection Prevention

## 2015-11-15 NOTE — Telephone Encounter (Signed)
Called patient and notified him that Rx is ready for pick. Left in the front office. Patient verbalized understanding.

## 2015-11-15 NOTE — Progress Notes (Signed)
Not here for PAT appointment. Attempted to call his cell number, no answer.

## 2015-11-20 ENCOUNTER — Other Ambulatory Visit: Payer: Self-pay | Admitting: Orthopedic Surgery

## 2015-11-20 ENCOUNTER — Encounter (HOSPITAL_COMMUNITY): Payer: Self-pay

## 2015-11-20 ENCOUNTER — Encounter (HOSPITAL_COMMUNITY)
Admission: RE | Admit: 2015-11-20 | Discharge: 2015-11-20 | Disposition: A | Discharge status: Home / Self Care | Payer: Managed Care, Other (non HMO) | Source: Ambulatory Visit | Attending: Orthopedic Surgery | Admitting: Orthopedic Surgery

## 2015-11-20 DIAGNOSIS — Z87891 Personal history of nicotine dependence: Secondary | ICD-10-CM | POA: Insufficient documentation

## 2015-11-20 DIAGNOSIS — Z955 Presence of coronary angioplasty implant and graft: Secondary | ICD-10-CM | POA: Diagnosis not present

## 2015-11-20 DIAGNOSIS — I252 Old myocardial infarction: Secondary | ICD-10-CM | POA: Insufficient documentation

## 2015-11-20 DIAGNOSIS — E039 Hypothyroidism, unspecified: Secondary | ICD-10-CM | POA: Diagnosis not present

## 2015-11-20 DIAGNOSIS — E785 Hyperlipidemia, unspecified: Secondary | ICD-10-CM | POA: Insufficient documentation

## 2015-11-20 DIAGNOSIS — I10 Essential (primary) hypertension: Secondary | ICD-10-CM | POA: Insufficient documentation

## 2015-11-20 DIAGNOSIS — Z01812 Encounter for preprocedural laboratory examination: Secondary | ICD-10-CM | POA: Diagnosis not present

## 2015-11-20 DIAGNOSIS — I2510 Atherosclerotic heart disease of native coronary artery without angina pectoris: Secondary | ICD-10-CM | POA: Diagnosis not present

## 2015-11-20 DIAGNOSIS — M1611 Unilateral primary osteoarthritis, right hip: Secondary | ICD-10-CM | POA: Insufficient documentation

## 2015-11-20 DIAGNOSIS — Z01818 Encounter for other preprocedural examination: Secondary | ICD-10-CM | POA: Diagnosis present

## 2015-11-20 LAB — CBC
HEMATOCRIT: 41.1 % (ref 39.0–52.0)
HEMOGLOBIN: 14.1 g/dL (ref 13.0–17.0)
MCH: 33.9 pg (ref 26.0–34.0)
MCHC: 34.3 g/dL (ref 30.0–36.0)
MCV: 98.8 fL (ref 78.0–100.0)
Platelets: 224 10*3/uL (ref 150–400)
RBC: 4.16 MIL/uL — ABNORMAL LOW (ref 4.22–5.81)
RDW: 15.3 % (ref 11.5–15.5)
WBC: 7.6 10*3/uL (ref 4.0–10.5)

## 2015-11-20 LAB — COMPREHENSIVE METABOLIC PANEL
ALK PHOS: 62 U/L (ref 38–126)
ALT: 24 U/L (ref 17–63)
ANION GAP: 8 (ref 5–15)
AST: 34 U/L (ref 15–41)
Albumin: 3.7 g/dL (ref 3.5–5.0)
BILIRUBIN TOTAL: 0.6 mg/dL (ref 0.3–1.2)
BUN: 18 mg/dL (ref 6–20)
CALCIUM: 9.6 mg/dL (ref 8.9–10.3)
CO2: 27 mmol/L (ref 22–32)
Chloride: 101 mmol/L (ref 101–111)
Creatinine, Ser: 0.8 mg/dL (ref 0.61–1.24)
Glucose, Bld: 93 mg/dL (ref 65–99)
POTASSIUM: 4.2 mmol/L (ref 3.5–5.1)
Sodium: 136 mmol/L (ref 135–145)
TOTAL PROTEIN: 7.2 g/dL (ref 6.5–8.1)

## 2015-11-20 LAB — SURGICAL PCR SCREEN
MRSA, PCR: NEGATIVE
Staphylococcus aureus: NEGATIVE

## 2015-11-20 NOTE — Progress Notes (Signed)
Left message at Dr. Mardelle Matte office requesting new consent order. Original consent order was for incorrect procedure.

## 2015-11-20 NOTE — Progress Notes (Signed)
Patient denies chest pain, shortness of breath. Cardiologist is Dr. Debara Pickett last [place] noted in [place]. PCP Carlis Abbott, NP Belenda Cruise.

## 2015-11-25 ENCOUNTER — Inpatient Hospital Stay
Admission: EM | Admit: 2015-11-25 | Discharge: 2015-11-26 | DRG: 303 | Disposition: A | Discharge status: Home / Self Care | Payer: Managed Care, Other (non HMO) | Attending: Internal Medicine | Admitting: Internal Medicine

## 2015-11-25 ENCOUNTER — Other Ambulatory Visit: Payer: Self-pay

## 2015-11-25 ENCOUNTER — Emergency Department: Payer: Managed Care, Other (non HMO)

## 2015-11-25 ENCOUNTER — Encounter (HOSPITAL_COMMUNITY): Payer: Self-pay | Admitting: Vascular Surgery

## 2015-11-25 ENCOUNTER — Encounter: Payer: Self-pay | Admitting: Emergency Medicine

## 2015-11-25 DIAGNOSIS — R079 Chest pain, unspecified: Secondary | ICD-10-CM | POA: Diagnosis not present

## 2015-11-25 DIAGNOSIS — I429 Cardiomyopathy, unspecified: Secondary | ICD-10-CM | POA: Diagnosis present

## 2015-11-25 DIAGNOSIS — F1010 Alcohol abuse, uncomplicated: Secondary | ICD-10-CM | POA: Diagnosis present

## 2015-11-25 DIAGNOSIS — I10 Essential (primary) hypertension: Secondary | ICD-10-CM | POA: Diagnosis present

## 2015-11-25 DIAGNOSIS — Z716 Tobacco abuse counseling: Secondary | ICD-10-CM

## 2015-11-25 DIAGNOSIS — I252 Old myocardial infarction: Secondary | ICD-10-CM

## 2015-11-25 DIAGNOSIS — Z809 Family history of malignant neoplasm, unspecified: Secondary | ICD-10-CM | POA: Diagnosis not present

## 2015-11-25 DIAGNOSIS — Z79899 Other long term (current) drug therapy: Secondary | ICD-10-CM | POA: Diagnosis not present

## 2015-11-25 DIAGNOSIS — I25110 Atherosclerotic heart disease of native coronary artery with unstable angina pectoris: Secondary | ICD-10-CM | POA: Diagnosis present

## 2015-11-25 DIAGNOSIS — Z955 Presence of coronary angioplasty implant and graft: Secondary | ICD-10-CM | POA: Diagnosis not present

## 2015-11-25 DIAGNOSIS — I200 Unstable angina: Secondary | ICD-10-CM | POA: Diagnosis not present

## 2015-11-25 DIAGNOSIS — F17210 Nicotine dependence, cigarettes, uncomplicated: Secondary | ICD-10-CM | POA: Diagnosis present

## 2015-11-25 DIAGNOSIS — Z9889 Other specified postprocedural states: Secondary | ICD-10-CM

## 2015-11-25 DIAGNOSIS — F419 Anxiety disorder, unspecified: Secondary | ICD-10-CM | POA: Diagnosis present

## 2015-11-25 DIAGNOSIS — E785 Hyperlipidemia, unspecified: Secondary | ICD-10-CM | POA: Diagnosis present

## 2015-11-25 DIAGNOSIS — Z888 Allergy status to other drugs, medicaments and biological substances status: Secondary | ICD-10-CM

## 2015-11-25 LAB — BASIC METABOLIC PANEL
ANION GAP: 11 (ref 5–15)
BUN: 9 mg/dL (ref 6–20)
CHLORIDE: 99 mmol/L — AB (ref 101–111)
CO2: 25 mmol/L (ref 22–32)
Calcium: 9 mg/dL (ref 8.9–10.3)
Creatinine, Ser: 0.83 mg/dL (ref 0.61–1.24)
Glucose, Bld: 101 mg/dL — ABNORMAL HIGH (ref 65–99)
POTASSIUM: 3.4 mmol/L — AB (ref 3.5–5.1)
SODIUM: 135 mmol/L (ref 135–145)

## 2015-11-25 LAB — CBC WITH DIFFERENTIAL/PLATELET
BASOS PCT: 1 %
Basophils Absolute: 0.1 10*3/uL (ref 0–0.1)
EOS ABS: 0 10*3/uL (ref 0–0.7)
Eosinophils Relative: 0 %
HEMATOCRIT: 41.8 % (ref 40.0–52.0)
HEMOGLOBIN: 14 g/dL (ref 13.0–18.0)
Lymphocytes Relative: 14 %
Lymphs Abs: 1 10*3/uL (ref 1.0–3.6)
MCH: 33.3 pg (ref 26.0–34.0)
MCHC: 33.5 g/dL (ref 32.0–36.0)
MCV: 99.3 fL (ref 80.0–100.0)
MONOS PCT: 8 %
Monocytes Absolute: 0.6 10*3/uL (ref 0.2–1.0)
NEUTROS ABS: 5.3 10*3/uL (ref 1.4–6.5)
NEUTROS PCT: 77 %
Platelets: 181 10*3/uL (ref 150–440)
RBC: 4.21 MIL/uL — AB (ref 4.40–5.90)
RDW: 15.5 % — ABNORMAL HIGH (ref 11.5–14.5)
WBC: 6.9 10*3/uL (ref 3.8–10.6)

## 2015-11-25 LAB — CBC
HCT: 40 % (ref 40.0–52.0)
Hemoglobin: 13.5 g/dL (ref 13.0–18.0)
MCH: 33.4 pg (ref 26.0–34.0)
MCHC: 33.8 g/dL (ref 32.0–36.0)
MCV: 98.8 fL (ref 80.0–100.0)
PLATELETS: 155 10*3/uL (ref 150–440)
RBC: 4.04 MIL/uL — AB (ref 4.40–5.90)
RDW: 15.6 % — ABNORMAL HIGH (ref 11.5–14.5)
WBC: 4.7 10*3/uL (ref 3.8–10.6)

## 2015-11-25 LAB — CREATININE, SERUM
CREATININE: 0.77 mg/dL (ref 0.61–1.24)
GFR calc Af Amer: >60 mL/min (ref >60)

## 2015-11-25 LAB — TROPONIN I: Troponin I: <0.03 ng/mL (ref <0.031)

## 2015-11-25 MED ORDER — LORAZEPAM 2 MG/ML IJ SOLN: 1.0000 mg | Freq: Four times a day (QID) | INTRAMUSCULAR | Status: DC | PRN

## 2015-11-25 MED ORDER — THIAMINE HCL 100 MG/ML IJ SOLN
100.0000 mg | Freq: Every day | INTRAMUSCULAR | Status: DC
Start: 2015-11-25 — End: 2015-11-26
  Filled 2015-11-25: qty 2

## 2015-11-25 MED ORDER — LORAZEPAM 2 MG/ML IJ SOLN
1.0000 mg | Freq: Once | INTRAMUSCULAR | Status: AC
  Administered 2015-11-25: 1 mg via INTRAVENOUS
  Filled 2015-11-25: qty 1

## 2015-11-25 MED ORDER — LORAZEPAM 1 MG PO TABS: 0.0000 mg | ORAL_TABLET | Freq: Two times a day (BID) | ORAL | Status: DC

## 2015-11-25 MED ORDER — ASPIRIN 81 MG PO CHEW
324.0000 mg | CHEWABLE_TABLET | Freq: Once | ORAL | Status: AC
  Administered 2015-11-25: 324 mg via ORAL
  Filled 2015-11-25: qty 4

## 2015-11-25 MED ORDER — ONDANSETRON HCL 4 MG/2ML IJ SOLN: 4.0000 mg | Freq: Four times a day (QID) | INTRAMUSCULAR | Status: DC | PRN

## 2015-11-25 MED ORDER — ATORVASTATIN CALCIUM 20 MG PO TABS
20.0000 mg | ORAL_TABLET | Freq: Every day | ORAL | Status: DC
  Administered 2015-11-25: 20 mg via ORAL
  Filled 2015-11-25: qty 1

## 2015-11-25 MED ORDER — SODIUM CHLORIDE 0.9 % IJ SOLN
3.0000 mL | Freq: Two times a day (BID) | INTRAMUSCULAR | Status: DC
Start: 2015-11-25 — End: 2015-11-26
  Administered 2015-11-25: 3 mL via INTRAVENOUS

## 2015-11-25 MED ORDER — ENOXAPARIN SODIUM 40 MG/0.4ML ~~LOC~~ SOLN
40.0000 mg | SUBCUTANEOUS | Status: DC
  Administered 2015-11-25: 40 mg via SUBCUTANEOUS
  Filled 2015-11-25: qty 0.4

## 2015-11-25 MED ORDER — ASPIRIN EC 325 MG PO TBEC: 325.0000 mg | DELAYED_RELEASE_TABLET | Freq: Every day | ORAL | Status: DC

## 2015-11-25 MED ORDER — ONDANSETRON HCL 4 MG PO TABS
4.0000 mg | ORAL_TABLET | Freq: Four times a day (QID) | ORAL | Status: DC | PRN
Start: 2015-11-25 — End: 2015-11-26

## 2015-11-25 MED ORDER — FOLIC ACID 1 MG PO TABS
1.0000 mg | ORAL_TABLET | Freq: Every day | ORAL | Status: DC
  Administered 2015-11-25 – 2015-11-26 (×2): 1 mg via ORAL
  Filled 2015-11-25 (×2): qty 1

## 2015-11-25 MED ORDER — MORPHINE SULFATE (PF) 2 MG/ML IV SOLN: 1.0000 mg | INTRAVENOUS | Status: DC | PRN

## 2015-11-25 MED ORDER — SENNOSIDES-DOCUSATE SODIUM 8.6-50 MG PO TABS: 1.0000 | ORAL_TABLET | Freq: Every evening | ORAL | Status: DC | PRN

## 2015-11-25 MED ORDER — ACETAMINOPHEN 325 MG PO TABS
650.0000 mg | ORAL_TABLET | Freq: Four times a day (QID) | ORAL | Status: DC | PRN
  Administered 2015-11-25: 650 mg via ORAL
  Filled 2015-11-25: qty 2

## 2015-11-25 MED ORDER — LORAZEPAM 1 MG PO TABS: 1.0000 mg | ORAL_TABLET | Freq: Four times a day (QID) | ORAL | Status: DC | PRN

## 2015-11-25 MED ORDER — VITAMIN B-1 100 MG PO TABS
100.0000 mg | ORAL_TABLET | Freq: Every day | ORAL | Status: DC
  Administered 2015-11-25 – 2015-11-26 (×2): 100 mg via ORAL
  Filled 2015-11-25 (×2): qty 1

## 2015-11-25 MED ORDER — ADULT MULTIVITAMIN W/MINERALS CH
1.0000 | ORAL_TABLET | Freq: Every day | ORAL | Status: DC
  Administered 2015-11-25 – 2015-11-26 (×2): 1 via ORAL
  Filled 2015-11-25 (×2): qty 1

## 2015-11-25 MED ORDER — ACETAMINOPHEN 650 MG RE SUPP
650.0000 mg | Freq: Four times a day (QID) | RECTAL | Status: DC | PRN
Start: 2015-11-25 — End: 2015-11-26

## 2015-11-25 MED ORDER — POTASSIUM CHLORIDE 20 MEQ/15ML (10%) PO SOLN
40.0000 meq | Freq: Once | ORAL | Status: AC
  Administered 2015-11-25: 40 meq via ORAL
  Filled 2015-11-25: qty 30

## 2015-11-25 MED ORDER — NITROGLYCERIN 0.4 MG SL SUBL: 0.4000 mg | SUBLINGUAL_TABLET | SUBLINGUAL | Status: DC | PRN

## 2015-11-25 MED ORDER — ASPIRIN 81 MG PO CHEW
81.0000 mg | CHEWABLE_TABLET | Freq: Every day | ORAL | Status: DC
  Administered 2015-11-26: 81 mg via ORAL
  Filled 2015-11-25: qty 1

## 2015-11-25 MED ORDER — NICOTINE 14 MG/24HR TD PT24
14.0000 mg | MEDICATED_PATCH | Freq: Every day | TRANSDERMAL | Status: DC
  Administered 2015-11-25 – 2015-11-26 (×2): 14 mg via TRANSDERMAL
  Filled 2015-11-25 (×2): qty 1

## 2015-11-25 MED ORDER — ALUM & MAG HYDROXIDE-SIMETH 200-200-20 MG/5ML PO SUSP: 30.0000 mL | Freq: Four times a day (QID) | ORAL | Status: DC | PRN

## 2015-11-25 MED ORDER — CARVEDILOL 3.125 MG PO TABS
3.1250 mg | ORAL_TABLET | Freq: Two times a day (BID) | ORAL | Status: DC
  Administered 2015-11-25: 3.125 mg via ORAL
  Filled 2015-11-25: qty 2

## 2015-11-25 MED ORDER — LORAZEPAM 1 MG PO TABS
0.0000 mg | ORAL_TABLET | Freq: Four times a day (QID) | ORAL | Status: DC
  Administered 2015-11-25 – 2015-11-26 (×2): 1 mg via ORAL
  Filled 2015-11-25 (×2): qty 1

## 2015-11-25 NOTE — ED Provider Notes (Signed)
[facility] Emergency Department Provider Note  ____________________________________________  Time seen: Upon arrival to the emergency department  I have reviewed the triage vital signs and the nursing notes.   HISTORY  Chief Complaint Chest Pain    HPI Kurt [name]. is a 49 y.o. male with a history of coronary artery disease as well as a panic disorder who is presenting today with chest pressure and shortness of breath. He says the chest pressure and shortness of breath started several hours ago and feels like a panic attack. He says it is across the front of his chest and nonradiating. He says that it feels like his anxiety attacks which started within the past year. He stopped drinking and smoking this past New Year's and said that that is what is panic attack started. He says he is currently going through a divorce which is highly stressful for him. However, he says he was not thinking about this when the pain started this morning. He says that his anxiety attacks do not have any inciting factors over the past year and they can come on suddenly without any warning. He is supposed to get his hip replaced tomorrow. This is his right hip. 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 He says he has been off aspirin and Plavix for months now. Has a history of cardiac disease with stents. He describes the pressure at this time it is a 2 out of 10.   Past Medical History  Diagnosis Date  . Coronary artery disease   . Hypertension   . 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 Thyroid disease     reports that he has been low in the past but since he has stoped ETOH it has been normal  . 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([place])   . History of MI (myocardial infarction) 09/12/2015  . Coronary disease 09/12/2015  . Hyperlipidemia     Patient Active Problem List   Diagnosis Date Noted  . Preoperative cardiovascular examination 10/20/2015  . Arteriosclerosis of coronary  artery 10/20/2015  . HLD (hyperlipidemia) 10/20/2015  . Alcoholism ([place]) 10/20/2015  . Anxiety 10/20/2015  . History of MI (myocardial infarction) 09/12/2015  . Coronary disease 09/12/2015  . Depression 07/31/2015  . Hypothyroidism 07/31/2015  . Testosterone deficiency 07/31/2015  . Pain of left lower extremity 07/31/2015  . Benign essential HTN 06/28/2014  . Acquired hypothyroidism 04/06/2013  . General patient noncompliance 03/29/2013    Past Surgical History  Procedure Laterality Date  . Coronary stent placement    . Tonsillectomy      as a child    Current Outpatient Rx  Name  Route  Sig  Dispense  Refill  . HYDROcodone-acetaminophen (NORCO/VICODIN) 5-325 MG tablet   Oral   Take 1 tablet by mouth every 6 (six) hours as needed for moderate pain.   50 tablet   0   . ibuprofen (ADVIL,MOTRIN) 200 MG tablet   Oral   Take 200 mg by mouth every 6 (six) hours as needed.           Allergies Peanut-containing drug products and Erythromycin  Family History  Problem Relation Age of Onset  . Cancer Mother   . Dementia Father   . Cancer Maternal Grandmother   . Cancer Maternal Grandfather   . Cancer Paternal Grandmother   . Cancer Paternal Grandfather     Social History Social History  Substance Use Topics  . Smoking status: Former Smoker -- 1.50 packs/day for  20 years  . Smokeless tobacco: Never Used  . Alcohol Use: No    Review of Systems Constitutional: No fever/chills Eyes: No visual changes. ENT: No sore throat. Cardiovascular: As above  Respiratory: As above Gastrointestinal: No abdominal pain.  No nausea, no vomiting.  No diarrhea.  No constipation. Genitourinary: Negative for dysuria. Musculoskeletal: Negative for back pain. Skin: Negative for rash. Neurological: Negative for headaches, focal weakness or numbness.  10-point ROS otherwise negative.  ____________________________________________   PHYSICAL EXAM:  VITAL SIGNS: ED Triage Vitals   Enc Vitals Group     BP 11/25/15 1031 146/101 mmHg     Pulse Rate 11/25/15 1031 92     Resp 11/25/15 1031 22     Temp 11/25/15 1031 98 F (36.7 C)     Temp Source 11/25/15 1031 Oral     SpO2 11/25/15 1031 97 %     Weight 11/25/15 1031 145 lb (65.772 kg)     Height 11/25/15 1031 6' (1.829 m)     Head Cir --      Peak Flow --      Pain Score 11/25/15 1032 0     Pain Loc --      Pain Edu? --      Excl. in [place]? --     Constitutional: Alert and oriented. No distress but appears anxious with pressured speech. Eyes: Conjunctivae are normal. PERRL. EOMI. Head: Atraumatic. Nose: No congestion/rhinnorhea. Mouth/Throat: Mucous membranes are moist.  Oropharynx non-erythematous. Neck: No stridor.   Cardiovascular: Normal rate, regular rhythm. Grossly normal heart sounds.  Good peripheral circulation. Respiratory: Normal respiratory effort.  No retractions. Lungs CTAB. Gastrointestinal: Soft and nontender. No distention. No abdominal bruits. No CVA tenderness. Musculoskeletal: No lower extremity tenderness nor edema.  No joint effusions. Neurologic:  Normal speech and language. No gross focal neurologic deficits are appreciated. No gait instability. Skin:  Skin is warm, dry and intact. No rash noted. Psychiatric: Mood and affect are normal. Speech and behavior are normal.  ____________________________________________   LABS (all labs ordered are listed, but only abnormal results are displayed)  Labs Reviewed  CBC WITH DIFFERENTIAL/PLATELET - Abnormal; Notable for the following:    RBC 4.21 (*)    RDW 15.5 (*)    All other components within normal limits  TROPONIN I  BASIC METABOLIC PANEL   ____________________________________________  EKG  ED ECG REPORT I, Doran Stabler, the attending physician, personally viewed and interpreted this ECG.   Date: 11/25/2015  EKG Time: 1020   Rate: 87  Rhythm: normal sinus rhythm  Axis: Normal axis  Intervals:none  ST&T Change: No  ST segment elevation or depression. T-wave inversions in aVL, V2 through V4. The T-wave inversions in V3 through V4 are new.  ____________________________________________  RADIOLOGY  COPD without any definitive consolidation. ____________________________________________   PROCEDURES   ____________________________________________   INITIAL IMPRESSION / ASSESSMENT AND PLAN / ED COURSE  Pertinent labs & imaging results that were available during my care of the patient were reviewed by me and considered in my medical decision making (see chart for details).  ----------------------------------------- 11:20 AM on 11/25/2015 -----------------------------------------  Patient without any relief with his nitroglycerin. New EKG changes in the precordial leads. We'll admit to the hospital for further workup. Patient is high risk because he is off any anticoagulant and also has a history of coronary artery disease with stents. No relief with nitroglycerin. We'll try Ativan. Signed out to Dr. Genia Harold for admission. The patient is understanding of  the plan and willing to comply. ____________________________________________   FINAL CLINICAL IMPRESSION(S) / ED DIAGNOSES  Chest pain.    Orbie Pyo, MD 11/25/15 1120

## 2015-11-25 NOTE — ED Notes (Signed)
Pt awaiting admission bed assigned at this time. No acute distress noted.

## 2015-11-25 NOTE — ED Notes (Signed)
Per EMS pt was at work when he began having sudden onset chest pressure midline at the sternum. Per EMS pt 97% on RA, 100% on 15L NRB. Per EMS pt has several environmental stressors, and a hx of cardiac issues including 2 stent placements at this time. Pt is scheduled to have a hip replacement tomorrow at [facility]. Pt states he feels more like he is having a panic attacked. Pt is noted to be acutely anxious at this time, hyperventilating and unable to sit still in the bed.

## 2015-11-25 NOTE — H&P (Signed)
[place] [facility] [place] NAME: Kurt [name]    MR#:  [id number]  DATE OF BIRTH:  [birth year]  DATE OF ADMISSION:  11/25/2015  PRIMARY CARE PHYSICIAN: Sheral Flow, NP   REQUESTING/REFERRING PHYSICIAN: Dr Dineen Kid  CHIEF COMPLAINT:  Chest pressure HISTORY OF PRESENT ILLNESS:  Kurt [name]  is a 49 y.o. male with a known history of coronary artery disease status post stents he says about 10-12 years ago and anxiety presents with above complaint. Patient says that this morning he felt short of breath, dizzy, lightheaded and had chest pressure. He says this felt different than his heart attack he had several years ago. He also says this is different than his normal pain attacks and that's why he presented to the ER. 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 Approximate 3 weeks ago he started to drink EtOH any drink for one week. He currently has no chest pressure or pain. In the emergency room EKG was performed which shows T-wave inversions in the lateral leads and therefore patient will be admitted.  PAST MEDICAL HISTORY:   Past Medical History  Diagnosis Date  . Coronary artery disease   . Hypertension   . 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 Thyroid disease     reports that he has been low in the past but since he has stoped ETOH it has been normal  . 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([place])   . History of MI (myocardial infarction) 09/12/2015  . Coronary disease 09/12/2015  . Hyperlipidemia     PAST SURGICAL HISTORY:   Past Surgical History  Procedure Laterality Date  . Coronary stent placement    . Tonsillectomy      as a child    SOCIAL HISTORY:   Social History  Substance Use Topics  . Smoking status:  smokes one to 2 packs a day for 20 years  . Smokeless tobacco: Never Used  . Alcohol Use:  he says he quit 3 weeks  ago     FAMILY HISTORY:   Family History  Problem Relation Age of Onset  . Cancer Mother   . Dementia Father   . Cancer Maternal Grandmother   . Cancer Maternal Grandfather   . Cancer Paternal Grandmother   . Cancer Paternal Grandfather     DRUG ALLERGIES:   Allergies  Allergen Reactions  . Peanut-Containing Drug Products Anaphylaxis  . Erythromycin Other (See Comments)    Unknown childhood allergy     REVIEW OF SYSTEMS:  CONSTITUTIONAL: No fever, fatigue or weakness.  EYES: No blurred or double vision.  EARS, NOSE, AND THROAT: No tinnitus or ear pain.  RESPIRATORY: No cough, shortness of breath, wheezing or hemoptysis.  CARDIOVASCULAR: ++chest pressure no chest pain, orthopnea, edema.  GASTROINTESTINAL: No nausea, vomiting, diarrhea or abdominal pain.  GENITOURINARY: No dysuria, hematuria.  ENDOCRINE: No polyuria, nocturia,  HEMATOLOGY: No anemia, easy bruising or bleeding SKIN: No rash or lesion. MUSCULOSKELETAL: No joint pain or arthritis.   NEUROLOGIC: No tingling, numbness, weakness.  PSYCHIATRY: ++ anxiety  No depression.   MEDICATIONS AT HOME:   Prior to Admission medications   Medication Sig Start Date End Date Taking? Authorizing Provider  HYDROcodone-acetaminophen (NORCO/VICODIN) 5-325 MG tablet Take 1 tablet by mouth every 6 (six) hours as needed for moderate pain. 11/15/15  Yes Jearld Fenton, NP  ibuprofen (ADVIL,MOTRIN)  200 MG tablet Take 200 mg by mouth every 6 (six) hours as needed.   Yes Historical Provider, MD      VITAL SIGNS:  Blood pressure 165/80, pulse 86, temperature 98 F (36.7 C), temperature source Oral, resp. rate 22, height 6' (1.829 m), weight 65.772 kg (145 lb), SpO2 97 %.  PHYSICAL EXAMINATION:  GENERAL:  49 y.o.-year-old patient lying in the bed  very anxious and tremulous  EYES: Pupils equal, round, reactive to light and accommodation. No scleral icterus. Extraocular muscles intact.  HEENT: Head atraumatic, normocephalic.  Oropharynx and nasopharynx clear.  NECK:  Supple, no jugular venous distention. No thyroid enlargement, no tenderness.  LUNGS: Normal breath sounds bilaterally, no wheezing, rales,rhonchi or crepitation. No use of accessory muscles of respiration.  CARDIOVASCULAR: S1, S2 normal. No murmurs, rubs, or gallops.  ABDOMEN: Soft, nontender, nondistended. Bowel sounds present. No organomegaly or mass.  EXTREMITIES: No pedal edema, cyanosis, or clubbing.  NEUROLOGIC: Cranial nerves II through XII are grossly intact. No focal deficits. PSYCHIATRIC: The patient is alert and oriented x 3.  SKIN: No obvious rash, lesion, or ulcer.   LABORATORY PANEL:   CBC  Recent Labs Lab 11/25/15 1021  WBC 6.9  HGB 14.0  HCT 41.8  PLT 181   ------------------------------------------------------------------------------------------------------------------  Chemistries   Recent Labs Lab 11/20/15 1446 11/25/15 1021  NA 136 135  K 4.2 3.4*  CL 101 99*  CO2 27 25  GLUCOSE 93 101*  BUN 18 9  CREATININE 0.80 0.83  CALCIUM 9.6 9.0  AST 34  --   ALT 24  --   ALKPHOS 62  --   BILITOT 0.6  --    ------------------------------------------------------------------------------------------------------------------  Cardiac Enzymes  Recent Labs Lab 11/25/15 1021  TROPONINI <0.03   ------------------------------------------------------------------------------------------------------------------  RADIOLOGY:  Dg Chest 1 View  11/25/2015  CLINICAL DATA:  Sudden onset chest pressure at work in sternal region, coronary artery disease post 2 prior stents, environmental stressors, question panic attack, anxiety, hyperventilation, history hypertension, prior MI EXAM: CHEST 1 VIEW COMPARISON:  Portable exam 1038 hours without priors for comparison. Correlation made with prior CT chest 10/03/2015 FINDINGS: Normal heart size, mediastinal contours and pulmonary vascularity. Hyperinflation central peribronchial  thickening compatible with COPD. Nodular density in the mid RIGHT lung, seen on 1 of 2 AP views but not on second, suspect nipple shadow though patient does have a known RIGHT lower lobe nodule of similar size by prior CT. No acute infiltrate, pleural effusion or pneumothorax. Osseous structures unremarkable. IMPRESSION: COPD changes without definite acute infiltrate. Suspected RIGHT nipple shadow on 1 of 2 AP views, not seen on second view as above. Electronically Signed   By: Lavonia Dana M.D.   On: 11/25/2015 11:02    EKG:   Normal sinus rhythm with T-wave inversions in the lateral leads  IMPRESSION AND PLAN:   This is a 49 year old male with a history of heart disease who has not been on any medications for over 8 years and panic attacks who presents with chest pressure and abnormal EKG.    1. Unstable angina: Patient symptoms may be related to unstable angina given the abnormal EKG. Patient will be admitted to the hospital service. We will continue to follow troponins 3. If these are negative patient will undergo stress test in a.m. If these become positive he will obviously have a non-ST elevation MI and will need cardiology consultation. I started aspirin and beta blocker. Lipid panel will be ordered for a.m.   2. Anxiety:  I have prescribed Ativan when necessary.  3. Tobacco dependence: Patient is encouraged to stop smoking. Patient is counseled for 3 minutes. Patient does want a nicotine patch.  4. EtOH abuse: It is unclear to me if patient is actively undergoing active wtihdrawal or just related to severe anxiety.. I will place patient on CIWA protocol.    All the records are reviewed and case discussed with ED provider. Management plans discussed with the patient and he is in agreement.  CODE STATUS: FULL  TOTAL TIME TAKING CARE OF THIS PATIENT: 50 minutes.    Cari Vandeberg M.D on 11/25/2015 at 11:23 AM  Between 7am to 6pm - Pager - [phone] After 6pm go to [email or web address] -  password [email or web address] [facility]  [place] [facility]  Office  [phone]  CC: Primary care physician; Sheral Flow, NP

## 2015-11-25 NOTE — Progress Notes (Signed)
Anesthesia Chart Review: Patient is a 49 year old male scheduled for right THA tomorrow (first case) by Dr. Mardelle Matte.  History includes former smoker, CAD/MI '06 s/p stents (reportedly to LAD and RCA by [facility] records--no cath report found; but described as two overlapping stents by Dr. Lysbeth Penner note), HTN, recovering alcoholic, hypothyroidism (reported resolved after he became sober), HLD, tonsillectomy. History also lists pituitary abnormality but 01/11/13 noted by Dr. Molinda Bailiff with [facility] (Care Everywhere) indicate patient was treated for secondary hypothyroidism and secondary hypogonadism and had "Pituitary MRI normal 02/2009."   PCP is Alma Friendly, NP. Cardiologist is Dr. Lyman Bishop, recently established 10/18/15. He cleared patient at that time because he denied CV symptoms and could exercise > 4 METS.  11/25/15 EKG: SR, borderline short PR interval, anterior infarct (age undetermined). Low r wave in high lateral leads, baseline wanderer. New deep T wave inversion in V3-V4 (borderline V5) when compared to 10/18/15 tracing, consider anterior ischemia.  01/21/08 Nuclear stress test ([facility]; Care Everywhere): CONCLUSION: Left ventricular enlargement. No definite ischemia or infarct. EF 48%.  01/20/08 Echo ([facility]; Care Everywhere): This was essentially a normal study. LVEF 55-65%.   10/03/15 Chest CT: IMPRESSION: 1. 6 mm noncalcified nodule in the right lower lobe. 5 mm nodule in the left upper lobe. If the patient is at high risk for bronchogenic carcinoma, follow-up chest CT at 6-12 months is recommended. If the patient is at low risk for bronchogenic carcinoma, follow-up chest CT at 12 months is recommended. This recommendation follows the consensus statement: Guidelines for Management of Small Pulmonary Nodules Detected on CT Scans: A Statement from the [employer] as published in Radiology 2005;237:395-400. 2. Paraseptal and centrilobular emphysema. Per  Alma Friendly, NP, plan to repeat CT chest in 6 months.  Labs from 11/20/15 noted. 11/25/15 labs pending.   It appears patient checked in the [facility] ED for chest pain within the past hour. EKG also shows some changes since last month. Will let Dr. Luanna Cole office know, and follow-up events which could effect timing of his surgery. ED provider notes are also pending. Troponin is pending. If negative, may still need cardiology to evaluate prior to undergoing elective surgery since he has new symptoms and EKG changes.    [facility]/Anesthesiology Phone [phone] 11/25/2015 10:51 AM

## 2015-11-25 NOTE — ED Notes (Addendum)
Pt waiting for admitting bed. Pt a+o, breathing even and unlabored. Denies current chest pain.

## 2015-11-25 NOTE — ED Notes (Signed)
Talked to Dr. Benjie Karvonen. Gave permission for pt to eat.

## 2015-11-26 ENCOUNTER — Encounter (HOSPITAL_COMMUNITY): Admission: RE | Payer: Self-pay | Source: Ambulatory Visit

## 2015-11-26 ENCOUNTER — Inpatient Hospital Stay (HOSPITAL_COMMUNITY)
Admit: 2015-11-26 | Discharge: 2015-11-26 | Disposition: A | Discharge status: Home / Self Care | Payer: Managed Care, Other (non HMO) | Attending: Internal Medicine | Admitting: Internal Medicine

## 2015-11-26 ENCOUNTER — Telehealth: Payer: Self-pay

## 2015-11-26 ENCOUNTER — Inpatient Hospital Stay (HOSPITAL_COMMUNITY): Payer: Managed Care, Other (non HMO)

## 2015-11-26 ENCOUNTER — Inpatient Hospital Stay (HOSPITAL_COMMUNITY)
Admission: RE | Admit: 2015-11-26 | Payer: Managed Care, Other (non HMO) | Source: Ambulatory Visit | Admitting: Orthopedic Surgery

## 2015-11-26 DIAGNOSIS — I200 Unstable angina: Secondary | ICD-10-CM

## 2015-11-26 DIAGNOSIS — R079 Chest pain, unspecified: Secondary | ICD-10-CM

## 2015-11-26 LAB — NM MYOCAR MULTI W/SPECT W/WALL MOTION / EF
CHL CUP NUCLEAR SRS: 6
CHL CUP NUCLEAR SSS: 5
CSEPHR: 62 %
LV sys vol: 78 mL
LVDIAVOL: 131 mL
NUC STRESS TID: 1.02
Peak HR: 106 {beats}/min
Rest HR: 69 {beats}/min
SDS: 0

## 2015-11-26 LAB — LIPID PANEL
Cholesterol: 229 mg/dL — ABNORMAL HIGH (ref 0–200)
HDL: 94 mg/dL
LDL Cholesterol: 124 mg/dL — ABNORMAL HIGH (ref 0–99)
Total CHOL/HDL Ratio: 2.4 ratio
Triglycerides: 54 mg/dL
VLDL: 11 mg/dL (ref 0–40)

## 2015-11-26 LAB — HEMOGLOBIN A1C: Hgb A1c MFr Bld: 5.4 % (ref 4.0–6.0)

## 2015-11-26 LAB — TROPONIN I: Troponin I: <0.03 ng/mL

## 2015-11-26 SURGERY — ARTHROPLASTY, HIP, TOTAL,POSTERIOR APPROACH
Anesthesia: General | Laterality: Right

## 2015-11-26 MED ORDER — LISINOPRIL 5 MG PO TABS: 5.0000 mg | ORAL_TABLET | Freq: Every day | ORAL | Status: DC

## 2015-11-26 MED ORDER — ATORVASTATIN CALCIUM 20 MG PO TABS: 20.0000 mg | ORAL_TABLET | Freq: Every day | ORAL | Status: DC

## 2015-11-26 MED ORDER — REGADENOSON 0.4 MG/5ML IV SOLN
0.4000 mg | Freq: Once | INTRAVENOUS | Status: AC
  Administered 2015-11-26: 0.4 mg via INTRAVENOUS

## 2015-11-26 MED ORDER — TECHNETIUM TC 99M SESTAMIBI - CARDIOLITE
31.4800 | Freq: Once | INTRAVENOUS | Status: AC | PRN
  Administered 2015-11-26: 10:00:00 31.48 via INTRAVENOUS

## 2015-11-26 MED ORDER — TECHNETIUM TC 99M SESTAMIBI - CARDIOLITE
13.0300 | Freq: Once | INTRAVENOUS | Status: AC | PRN
  Administered 2015-11-26: 09:00:00 13.03 via INTRAVENOUS

## 2015-11-26 MED ORDER — ASPIRIN 81 MG PO CHEW: 81.0000 mg | CHEWABLE_TABLET | Freq: Every day | ORAL | Status: DC

## 2015-11-26 MED ORDER — NICOTINE 14 MG/24HR TD PT24: 14.0000 mg | MEDICATED_PATCH | Freq: Every day | TRANSDERMAL | Status: DC

## 2015-11-26 MED ORDER — ALPRAZOLAM 0.25 MG PO TABS: 0.2500 mg | ORAL_TABLET | Freq: Two times a day (BID) | ORAL | Status: DC | PRN

## 2015-11-26 MED ORDER — CARVEDILOL 3.125 MG PO TABS: 3.1250 mg | ORAL_TABLET | Freq: Two times a day (BID) | ORAL | Status: DC

## 2015-11-26 NOTE — Progress Notes (Signed)
ECHO has been completed. Per Dr. Leslye Peer okay for patient to discharge after completion of ECHO, patient has follow up appointment with Dr. Rockey Situ on 12/06/2015.

## 2015-11-26 NOTE — Progress Notes (Signed)
Patient received discharge instructions, pt verbalized understanding. IV was removed with no signs of infection. Dressing clean, dry intact. No skin tears or wounds present. Prescription was printed and given to patient. Patient was escorted out with staff member via private auto. Patient was given a work note. No further needs from care management team. MD is aware of ECHO results. Pt okay for discharge.

## 2015-11-26 NOTE — Progress Notes (Signed)
*  PRELIMINARY RESULTS* Echocardiogram 2D Echocardiogram has been performed.  Laqueta Jean Hege 11/26/2015, 2:46 PM

## 2015-11-26 NOTE — Progress Notes (Signed)
Patient ID: Kurt [name]., male   DOB: Jun 27, 1966, 49 y.o.   MRN: [id number] [place] [facility] [place] was admitted to the [facility] on 11/25/2015 and Discharged  11/26/2015 and should be excused from work/school   for 3 days starting 11/25/2015 , may return to work/school without any restrictions.  Loletha Grayer M.D on 11/26/2015,at 2:54 PM  [place] [facility] [place]

## 2015-11-26 NOTE — Telephone Encounter (Signed)
Patient contacted regarding discharge from [facility] on 11/26/15.  Patient understands to follow up with Dr. Rockey Situ on 12/06/15 at 8:40 at [facility]. Patient understands discharge instructions? yes Patient understands medications and regiment? yes Patient understands to bring all medications to this visit? yes  Pt is still in the hospital, has not been released yet.   States that he just discussed discharge instructions w/ someone and will call back w/ any questions.

## 2015-11-26 NOTE — Plan of Care (Signed)
Problem: Phase I Progression Outcomes Goal: Hemodynamically stable Outcome: Progressing Pt denies any CP since transferred from the ED. BP 140-150/80-90s, HR NSR 60-90, on RA SpO2 > 90%. 3 sets of troponin negative.

## 2015-11-26 NOTE — Telephone Encounter (Signed)
-----   Message from Blain Pais sent at 11/26/2015  2:25 PM EST ----- Regarding: tcm/ph 12/06/2015 Dr. Rockey Situ 8:40 am

## 2015-11-26 NOTE — Discharge Summary (Signed)
Kurt [name] NAME: Kurt [name]    MR#:  [id number]  DATE OF BIRTH:  16-Jan-1966  DATE OF ADMISSION:  11/25/2015 ADMITTING PHYSICIAN: Bettey Costa, MD  DATE OF DISCHARGE: 11/26/2015  PRIMARY CARE PHYSICIAN: Sheral Flow, NP    ADMISSION DIAGNOSIS:  Chest pain, unspecified chest pain type [R07.9]  DISCHARGE DIAGNOSIS:  Active Problems:   Unstable angina ([place])   SECONDARY DIAGNOSIS:   Past Medical History  Diagnosis Date  . Coronary artery disease   . Hypertension   . 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 Thyroid disease     reports that he has been low in the past but since he has stoped ETOH it has been normal  . 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([place])   . History of MI (myocardial infarction) 09/12/2015  . Coronary disease 09/12/2015  . Hyperlipidemia     HOSPITAL COURSE:   1.  Chest pain atypical. Patient thinks he had a panic attack. Cardiac enzymes 3 were negative. Patient currently is asymptomatic and wants to go home. Stress test was read as a small region of apical scar and an EF of 27%. In speaking with Dr. Rockey Situ cardiology this could be artifact. Recommends an echocardiogram. For the possibility of panic attack - small prescription of Xanax was prescribed 2. Possibility of cardiomyopathy, history of alcohol- prescribed Coreg and lisinopril and aspirin and cardiology follow-up as outpatient for cardiac clearance prior to hip surgery 3. History of heart disease- aspirin and statin 4. Hyperlipidemia LDL 124 goal LDL less than 100- on statin 5. History of alcohol abuse in the past 6. Tobacco abuse nicotine patch prescribed   DISCHARGE CONDITIONS:   Satisfactory  CONSULTS OBTAINED:  None  DRUG ALLERGIES:   Allergies  Allergen Reactions  . Peanut-Containing Drug Products Anaphylaxis  . Erythromycin Other (See Comments)    Unknown childhood allergy    DISCHARGE MEDICATIONS:   Current  Discharge Medication List    START taking these medications   Details  ALPRAZolam (XANAX) 0.25 MG tablet Take 1 tablet (0.25 mg total) by mouth 2 (two) times daily as needed for anxiety. Qty: 15 tablet, Refills: 0    aspirin 81 MG chewable tablet Chew 1 tablet (81 mg total) by mouth daily. Qty: 30 tablet, Refills: 0    atorvastatin (LIPITOR) 20 MG tablet Take 1 tablet (20 mg total) by mouth daily at 6 PM. Qty: 30 tablet, Refills: 0    carvedilol (COREG) 3.125 MG tablet Take 1 tablet (3.125 mg total) by mouth 2 (two) times daily with a meal. Qty: 60 tablet, Refills: 0    lisinopril (PRINIVIL,ZESTRIL) 5 MG tablet Take 1 tablet (5 mg total) by mouth daily. Qty: 30 tablet, Refills: 0    nicotine (NICODERM CQ - DOSED IN MG/24 HOURS) 14 mg/24hr patch Place 1 patch (14 mg total) onto the skin daily. Qty: 28 patch, Refills: 0      CONTINUE these medications which have NOT CHANGED   Details  HYDROcodone-acetaminophen (NORCO/VICODIN) 5-325 MG tablet Take 1 tablet by mouth every 6 (six) hours as needed for moderate pain. Qty: 50 tablet, Refills: 0      STOP taking these medications     ibuprofen (ADVIL,MOTRIN) 200 MG tablet          DISCHARGE INSTRUCTIONS:   Follow-up PMD in 2 weeks Follow-up Dr. Rockey Situ cardiology 1 week  If you experience worsening of your admission symptoms, develop shortness of breath, life threatening emergency, suicidal  or homicidal thoughts you must seek medical attention immediately by calling 911 or calling your MD immediately  if symptoms less severe.  You Must read complete instructions/literature along with all the possible adverse reactions/side effects for all the Medicines you take and that have been prescribed to you. Take any new Medicines after you have completely understood and accept all the possible adverse reactions/side effects.   Please note  You were cared for by a hospitalist during your hospital stay. If you have any questions about your  discharge medications or the care you received while you were in the hospital after you are discharged, you can call the unit and asked to speak with the hospitalist on call if the hospitalist that took care of you is not available. Once you are discharged, your primary care physician will handle any further medical issues. Please note that NO REFILLS for any discharge medications will be authorized once you are discharged, as it is imperative that you return to your primary care physician (or establish a relationship with a primary care physician if you do not have one) for your aftercare needs so that they can reassess your need for medications and monitor your lab values.    Today   CHIEF COMPLAINT:   Chief Complaint  Patient presents with  . Chest Pain    HISTORY OF PRESENT ILLNESS:  Kurt [name]  is a 49 y.o. male presented with chest pain and was admitted to the hospital.   VITAL SIGNS:  Blood pressure 155/91, pulse 66, temperature 98.2 F (36.8 C), temperature source Oral, resp. rate 18, height 6' (1.829 m), weight 61.871 kg (136 lb 6.4 oz), SpO2 97 %.    PHYSICAL EXAMINATION:  GENERAL:  49 y.o.-year-old patient lying in the bed with no acute distress.  EYES: Pupils equal, round, reactive to light and accommodation. No scleral icterus. Extraocular muscles intact.  HEENT: Head atraumatic, normocephalic. Oropharynx and nasopharynx clear.  NECK:  Supple, no jugular venous distention. No thyroid enlargement, no tenderness.  LUNGS: Normal breath sounds bilaterally, no wheezing, rales,rhonchi or crepitation. No use of accessory muscles of respiration.  CARDIOVASCULAR: S1, S2 normal. No murmurs, rubs, or gallops.  ABDOMEN: Soft, non-tender, non-distended. Bowel sounds present. No organomegaly or mass.  EXTREMITIES: No pedal edema, cyanosis, or clubbing.  NEUROLOGIC: Cranial nerves II through XII are intact. Muscle strength 5/5 in all extremities. Sensation intact. Gait not checked.   PSYCHIATRIC: The patient is alert and oriented x 3.  SKIN: No obvious rash, lesion, or ulcer.   DATA REVIEW:   CBC  Recent Labs Lab 11/25/15 2054  WBC 4.7  HGB 13.5  HCT 40.0  PLT 155    Chemistries   Recent Labs Lab 11/20/15 1446 11/25/15 1021 11/25/15 2054  NA 136 135  --   K 4.2 3.4*  --   CL 101 99*  --   CO2 27 25  --   GLUCOSE 93 101*  --   BUN 18 9  --   CREATININE 0.80 0.83 0.77  CALCIUM 9.6 9.0  --   AST 34  --   --   ALT 24  --   --   ALKPHOS 62  --   --   BILITOT 0.6  --   --     Cardiac Enzymes  Recent Labs Lab 11/26/15 0822  TROPONINI <0.03    Microbiology Results  Results for orders placed or performed during the hospital encounter of 11/20/15  Surgical pcr screen  Status: None   Collection Time: 11/20/15  2:46 PM  Result Value Ref Range Status   MRSA, PCR NEGATIVE NEGATIVE Final   Staphylococcus aureus NEGATIVE NEGATIVE Final    Comment:        The Xpert SA Assay (FDA approved for NASAL specimens in patients over 54 years of age), is one component of a comprehensive surveillance program.  Test performance has been validated by [facility] for patients greater than or equal to 31 year old. It is not intended to diagnose infection nor to guide or monitor treatment.     RADIOLOGY:  Dg Chest 1 View  11/25/2015  CLINICAL DATA:  Sudden onset chest pressure at work in sternal region, coronary artery disease post 2 prior stents, environmental stressors, question panic attack, anxiety, hyperventilation, history hypertension, prior MI EXAM: CHEST 1 VIEW COMPARISON:  Portable exam 1038 hours without priors for comparison. Correlation made with prior CT chest 10/03/2015 FINDINGS: Normal heart size, mediastinal contours and pulmonary vascularity. Hyperinflation central peribronchial thickening compatible with COPD. Nodular density in the mid RIGHT lung, seen on 1 of 2 AP views but not on second, suspect nipple shadow though patient does  have a known RIGHT lower lobe nodule of similar size by prior CT. No acute infiltrate, pleural effusion or pneumothorax. Osseous structures unremarkable. IMPRESSION: COPD changes without definite acute infiltrate. Suspected RIGHT nipple shadow on 1 of 2 AP views, not seen on second view as above. Electronically Signed   By: Lavonia Dana M.D.   On: 11/25/2015 11:02   Nm Myocar Multi W/spect W/wall Motion / Ef  11/26/2015   There was no ST segment deviation noted during stress.  Pharmacological  myocardial perfusion imaging study with no significant  Ischemia There is a small region of fixed defect in the apical region, seen at stress than rest. Region concerning for attenuation artifact though unable to exclude small region of old scar. Global hypokinesis with  EF estimated at 27% Baseline EKG with abnormal T wave V1 through V4, 1 and aVL No EKG changes concerning for ischemia with stress or in recovery . Low risk scan Consider echocardiogram to evaluate ejection fraction if clinically indicated    Management plans discussed with the patient, family and he is in agreement.  CODE STATUS:     Code Status Orders        Start     Ordered   11/25/15 2022  Full code   Continuous     11/25/15 2022      TOTAL TIME TAKING CARE OF THIS PATIENT: 35 minutes.    Loletha Grayer M.D on 11/26/2015 at 2:08 PM  Between 7am to 6pm - Pager - [phone]  After 6pm go to [email or web address] - password [email or web address] [facility]  [facility]  Office  [phone]  CC: Primary care physician; Sheral Flow, NP

## 2015-11-28 ENCOUNTER — Telehealth: Payer: Self-pay | Admitting: Primary Care

## 2015-11-28 ENCOUNTER — Encounter: Payer: Self-pay | Admitting: Primary Care

## 2015-11-28 ENCOUNTER — Ambulatory Visit (INDEPENDENT_AMBULATORY_CARE_PROVIDER_SITE_OTHER): Payer: Managed Care, Other (non HMO) | Admitting: Primary Care

## 2015-11-28 VITALS — BP 144/82 | HR 67 | Temp 98.1°F | Ht 72.0 in | Wt 142.0 lb

## 2015-11-28 DIAGNOSIS — E291 Testicular hypofunction: Secondary | ICD-10-CM

## 2015-11-28 DIAGNOSIS — F329 Major depressive disorder, single episode, unspecified: Secondary | ICD-10-CM

## 2015-11-28 DIAGNOSIS — E349 Endocrine disorder, unspecified: Secondary | ICD-10-CM

## 2015-11-28 DIAGNOSIS — F418 Other specified anxiety disorders: Secondary | ICD-10-CM

## 2015-11-28 DIAGNOSIS — F32A Depression, unspecified: Secondary | ICD-10-CM

## 2015-11-28 DIAGNOSIS — F419 Anxiety disorder, unspecified: Secondary | ICD-10-CM

## 2015-11-28 MED ORDER — BUPROPION HCL ER (XL) 150 MG PO TB24: 150.0000 mg | ORAL_TABLET | Freq: Every day | ORAL | Status: DC

## 2015-11-28 NOTE — Progress Notes (Signed)
Subjective:    Patient ID: Kurt [name]., male    DOB: [birth year], 49 y.o.   MRN: [id number]  HPI  Mr. Kurt [name]. Is a 49 year old male who presents today for hospital follow up. He presented to [facility] ED on 11/25/15 with a chief complaint of chest pressure and shortness of breath. He has a history of CAD with stent placement and panic disorder. He had new EKG changes and was without relief of pain with nitroglycerin in the ED. He was admitted for further work up. His cardiac enzymes were negative in the hospital. His stress test showed abnormality which was followed by echocardiogram (EF of 35-40%). He was placed on coreg, lisinopril, Xanax, and aspirin and referred to cardiology for outpatient follow up and clearance for surgery while in the hospital. He is currently pending surgery for avascular necrosis. He was discharged on 11/26/15 from [facility].  Since his discharge he's feeling tired and "run down". He was supposed to have surgery Tuesday this week and has not talked to them about getting it rescheduled. He is due for follow up with Dr. Rockey Situ on December 9th for surgical clearance. He's not taken any of the medications that were prescribed to him as he's not picked them up. He plans to go to the pharmacy this afternoon.  Wt Readings from Last 3 Encounters:  11/28/15 142 lb (64.411 kg)  11/25/15 136 lb 6.4 oz (61.871 kg)  11/20/15 143 lb 9.6 oz (65.137 kg)   BP Readings from Last 3 Encounters:  11/28/15 144/82  11/26/15 147/103  11/20/15 145/88     1) Generalized Anxiety Disorder: Long standing history. Once treated with Zoloft in rehab, restarted several months ago and developed suicidal thoughts and increased depression. He's had Increased anxiety and stress since September this year. He's going through a divorce, is to have surgery soon, and has ongoing health problems. GAD 7 score of 16 and PHQ 9 score of 20 today. Denies SI/HI.   Review of Systems  Respiratory: Negative for  shortness of breath.   Cardiovascular: Negative for chest pain.  Musculoskeletal: Positive for arthralgias.  Psychiatric/Behavioral: Positive for sleep disturbance. Negative for suicidal ideas. The patient is nervous/anxious.        Past Medical History  Diagnosis Date  . Coronary artery disease   . Hypertension   . 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 Thyroid disease     reports that he has been low in the past but since he has stoped ETOH it has been normal  . 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([place])   . History of MI (myocardial infarction) 09/12/2015  . Coronary disease 09/12/2015  . Hyperlipidemia     Social History   Social History  . Marital Status: Legally Separated    Spouse Name: N/A  . Number of Children: N/A  . Years of Education: N/A   Occupational History  . Not on file.   Social History Main Topics  . Smoking status: Former Smoker -- 1.50 packs/day for 20 years  . Smokeless tobacco: Never Used  . Alcohol Use: No  . Drug Use: No  . Sexual Activity: Not on file   Other Topics Concern  . Not on file   Social History Narrative   Separated.   Works as a Optician, dispensing.   Enjoys fishing, Proofreader, golfing.       Past Surgical History  Procedure Laterality Date  . Coronary stent placement    . Tonsillectomy  as a child    Family History  Problem Relation Age of Onset  . Cancer Mother   . Dementia Father   . Cancer Maternal Grandmother   . Cancer Maternal Grandfather   . Cancer Paternal Grandmother   . Cancer Paternal Grandfather     Allergies  Allergen Reactions  . Peanut-Containing Drug Products Anaphylaxis  . Erythromycin Other (See Comments)    Unknown childhood allergy    Current Outpatient Prescriptions on File Prior to Visit  Medication Sig Dispense Refill  . aspirin 81 MG chewable tablet Chew 1 tablet (81 mg total) by mouth daily. 30 tablet 0  . ALPRAZolam (XANAX) 0.25 MG tablet Take 1 tablet (0.25 mg total) by mouth 2  (two) times daily as needed for anxiety. (Patient not taking: Reported on 11/28/2015) 15 tablet 0  . atorvastatin (LIPITOR) 20 MG tablet Take 1 tablet (20 mg total) by mouth daily at 6 PM. (Patient not taking: Reported on 11/28/2015) 30 tablet 0  . carvedilol (COREG) 3.125 MG tablet Take 1 tablet (3.125 mg total) by mouth 2 (two) times daily with a meal. (Patient not taking: Reported on 11/28/2015) 60 tablet 0  . HYDROcodone-acetaminophen (NORCO/VICODIN) 5-325 MG tablet Take 1 tablet by mouth every 6 (six) hours as needed for moderate pain. (Patient not taking: Reported on 11/28/2015) 50 tablet 0  . lisinopril (PRINIVIL,ZESTRIL) 5 MG tablet Take 1 tablet (5 mg total) by mouth daily. (Patient not taking: Reported on 11/28/2015) 30 tablet 0  . nicotine (NICODERM CQ - DOSED IN MG/24 HOURS) 14 mg/24hr patch Place 1 patch (14 mg total) onto the skin daily. (Patient not taking: Reported on 11/28/2015) 28 patch 0   No current facility-administered medications on file prior to visit.    BP 144/82 mmHg  Pulse 67  Temp(Src) 98.1 F (36.7 C) (Oral)  Ht 6' (1.829 m)  Wt 142 lb (64.411 kg)  BMI 19.25 kg/m2  SpO2 98%    Objective:   Physical Exam  Constitutional: He appears well-nourished.  Cardiovascular: Normal rate and regular rhythm.   Pulmonary/Chest: Effort normal and breath sounds normal.  Skin: Skin is warm and dry.  Psychiatric:  Anxious, fidgety, cooperative.          Assessment & Plan:  Hospital Follow Up:  Presented to ED with chest pressure 11/28, admitted overnight for further work up given history. Stress test slightly abnormal, Echo with EF of 35-40%. Currently on atorvastatin. Started on coreg, lisinopril, Xanax, and aspirin in hospital. Discussed all meds and when to take, he verbalized understanding. Anxiety and depression today during exam. Failed on Zoloft. Due to side effects will not try another SSRI or SNRI. Start Wellbutrin XR 150 mg. Xanax PRN. He is to follow with  cardiology in early December for surgical clearance.  Follow up with me in 4-6 weeks for evaluation of Anxiety and Depression.

## 2015-11-28 NOTE — Assessment & Plan Note (Signed)
In process with Urology for further evaluation.

## 2015-11-28 NOTE — Patient Instructions (Addendum)
Start Wellbutrin XL (Buproprion) for anxiety and depression. Take 1 tablet by mouth every morning.   Use Xanax as needed twice daily for anxiety and sleep.  Continue medications as prescribed from the hospital.  Please follow up with me in 4-6 weeks.  It was a pleasure to see you today!

## 2015-11-28 NOTE — Progress Notes (Signed)
Pre visit review using our clinic review tool, if applicable. No additional management support is needed unless otherwise documented below in the visit note. 

## 2015-11-28 NOTE — Assessment & Plan Note (Addendum)
PHQ 9 score of 20 GAD 7 score of 16 today. Denies SI/HI, fidgety during exam. Hospitalist provided PRN xanax to use which he's not yet filled. Once managed on Zoloft, didn't like the way he felt. Also had SI and increased depression. Will start Wellbutrin XL 150 mg daily. Use Xanax PRN. Follow up in 4-6 weeks for re-evaluation.

## 2015-11-28 NOTE — Telephone Encounter (Signed)
Please notify Kurt [name] that I received his records from urology. They are working him up for other reasons that could be causing his low testosterone. They plan to discuss the results and their plan at his next visit. They are actively working to help him.

## 2015-11-29 NOTE — Telephone Encounter (Signed)
Message left for patient to return my call.  

## 2015-11-29 NOTE — Telephone Encounter (Signed)
Patient called back and notified patient of Kate's comments. Patient verbalized understanding.

## 2015-12-05 ENCOUNTER — Ambulatory Visit: Payer: Managed Care, Other (non HMO) | Admitting: Primary Care

## 2015-12-06 ENCOUNTER — Encounter: Payer: Self-pay | Admitting: Cardiovascular Disease

## 2015-12-06 ENCOUNTER — Ambulatory Visit (INDEPENDENT_AMBULATORY_CARE_PROVIDER_SITE_OTHER): Payer: Managed Care, Other (non HMO) | Admitting: Cardiovascular Disease

## 2015-12-06 VITALS — BP 128/72 | HR 53 | Ht 72.0 in | Wt 151.5 lb

## 2015-12-06 DIAGNOSIS — F329 Major depressive disorder, single episode, unspecified: Secondary | ICD-10-CM

## 2015-12-06 DIAGNOSIS — F32A Depression, unspecified: Secondary | ICD-10-CM

## 2015-12-06 DIAGNOSIS — I10 Essential (primary) hypertension: Secondary | ICD-10-CM | POA: Diagnosis not present

## 2015-12-06 DIAGNOSIS — E785 Hyperlipidemia, unspecified: Secondary | ICD-10-CM

## 2015-12-06 DIAGNOSIS — Z01810 Encounter for preprocedural cardiovascular examination: Secondary | ICD-10-CM | POA: Diagnosis not present

## 2015-12-06 DIAGNOSIS — I2510 Atherosclerotic heart disease of native coronary artery without angina pectoris: Secondary | ICD-10-CM

## 2015-12-06 DIAGNOSIS — I200 Unstable angina: Secondary | ICD-10-CM | POA: Diagnosis not present

## 2015-12-06 DIAGNOSIS — F419 Anxiety disorder, unspecified: Secondary | ICD-10-CM

## 2015-12-06 DIAGNOSIS — I426 Alcoholic cardiomyopathy: Secondary | ICD-10-CM

## 2015-12-06 DIAGNOSIS — F418 Other specified anxiety disorders: Secondary | ICD-10-CM

## 2015-12-06 DIAGNOSIS — F1020 Alcohol dependence, uncomplicated: Secondary | ICD-10-CM

## 2015-12-06 MED ORDER — ATORVASTATIN CALCIUM 20 MG PO TABS: 20.0000 mg | ORAL_TABLET | Freq: Every day | ORAL | Status: DC

## 2015-12-06 MED ORDER — CARVEDILOL 3.125 MG PO TABS: 3.1250 mg | ORAL_TABLET | Freq: Two times a day (BID) | ORAL | Status: DC

## 2015-12-06 MED ORDER — LISINOPRIL 5 MG PO TABS: 5.0000 mg | ORAL_TABLET | Freq: Every day | ORAL | Status: DC

## 2015-12-06 NOTE — Assessment & Plan Note (Signed)
He reports he is currently not drinking Previously on to rehabilitation

## 2015-12-06 NOTE — Assessment & Plan Note (Signed)
Recent chest pain symptoms atypical in nature, likely from panic attack He does have underlying coronary artery disease, no significant ischemia on stress testing No further testing needed prior to hip surgery

## 2015-12-06 NOTE — Assessment & Plan Note (Signed)
Recommended he stay on the Lipitor, goal LDL less than 70

## 2015-12-06 NOTE — Assessment & Plan Note (Signed)
Low ejection fraction 35-40% on echocardiogram Recommended cessation from all alcohol He will start low-dose carvedilol, lisinopril, hold his metoprolol

## 2015-12-06 NOTE — Assessment & Plan Note (Signed)
Acceptable risk for upcoming surgery of his hip with Dr. Mardelle Matte No further testing needed. Currently with no angina symptoms Recent stress test showing no ischemia Would suggest limiting IV fluids given low ejection fraction and high risk of acute heart failure

## 2015-12-06 NOTE — Patient Instructions (Signed)
You are doing well.  Please start the coreg/carvedilol one pill twice a day Stop the metoprolol Stay on the lisinopril one a day  Please call us if you have new issues that need to be addressed before your next appt.  Your physician wants you to follow-up in: 6 months.  You will receive a reminder letter in the mail two months in advance. If you don't receive a letter, please call our office to schedule the follow-up appointment.

## 2015-12-06 NOTE — Progress Notes (Signed)
Patient ID: Kurt Walde., male    DOB: Nov 22, 1966, 49 y.o.   MRN: [id number]  HPI Comments: 49 year old male with a long history of alcoholism, reportedly stopped early 2016, recent relapse, presenting to the hospital with chest discomfort, shortness of breath, anxiety, stress test showing no significant ischemia, possible small fixed perfusion defect in the apical region, low ejection fraction 27%, confirmed with echocardiogram ejection fraction 35-40%, who presents for new patient visit to the [place] office. He requires preoperative evaluation prior to hip replacement surgery  In follow-up today, he reports that he has not had any further episodes of shortness of breath or chest pain He feels he was having a panic attack. Anxiety concerning his hip surgery.  Prior history reviewed with him, 2 stents likely to his LAD mid to distal region in 2006 No further intervention since that time Ejection fraction by echocardiogram 2009 was 55-60% per the notes He has never been told of a decreased heart function in the past  Since discharge from the hospital, he is been taking metoprolol which she had left over from before Unclear why he did not want to take lisinopril, carvedilol, reports having a mixup at the pharmacy  Prior smoker, stopped Reports compliance with his cholesterol medication Is taking pain medications for his severe right hip pain  EKG on today's visit shows normal sinus rhythm with rate 53 bpm, nonspecific ST and T-wave changes     Allergies  Allergen Reactions  . Peanut-Containing Drug Products Anaphylaxis  . Erythromycin Other (See Comments)    Unknown childhood allergy    Current Outpatient Prescriptions on File Prior to Visit  Medication Sig Dispense Refill  . ALPRAZolam (XANAX) 0.25 MG tablet Take 1 tablet (0.25 mg total) by mouth 2 (two) times daily as needed for anxiety. 15 tablet 0  . aspirin 81 MG chewable tablet Chew 1 tablet (81 mg total) by mouth  daily. 30 tablet 0  . buPROPion (WELLBUTRIN XL) 150 MG 24 hr tablet Take 1 tablet (150 mg total) by mouth daily. 30 tablet 3  . HYDROcodone-acetaminophen (NORCO/VICODIN) 5-325 MG tablet Take 1 tablet by mouth every 6 (six) hours as needed for moderate pain. 50 tablet 0   No current facility-administered medications on file prior to visit.    Past Medical History  Diagnosis Date  . Coronary artery disease   . Hypertension   . 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 Thyroid disease     reports that he has been low in the past but since he has stoped ETOH it has been normal  . 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([place])   . History of MI (myocardial infarction) 09/12/2015  . Coronary disease 09/12/2015  . Hyperlipidemia     Past Surgical History  Procedure Laterality Date  . Coronary stent placement    . Tonsillectomy      as a child    Social History  reports that he quit smoking 11 days ago. He has never used smokeless tobacco. He reports that he does not drink alcohol or use illicit drugs.  Family History family history includes Cancer in his maternal grandfather, maternal grandmother, mother, paternal grandfather, and paternal grandmother; Dementia in his father.   Review of Systems  Constitutional: Positive for unexpected weight change.  HENT: Negative.   Eyes: Negative.   Respiratory: Negative.   Cardiovascular: Negative.   Gastrointestinal: Negative.   Endocrine: Negative.   Musculoskeletal: Negative.   Skin: Negative.   Allergic/Immunologic: Negative.   Neurological: Negative.  Hematological: Negative.   Psychiatric/Behavioral: Positive for decreased concentration. The patient is nervous/anxious.   All other systems reviewed and are negative.   BP 128/72 mmHg  Pulse 53  Ht 6' (1.829 m)  Wt 151 lb 8 oz (68.72 kg)  BMI 20.54 kg/m2   Physical Exam  Constitutional: He is oriented to person, place, and time. He appears well-developed.  thin  HENT:  Head:  Normocephalic.  Nose: Nose normal.  Mouth/Throat: Oropharynx is clear and moist.  Eyes: Conjunctivae are normal. Pupils are equal, round, and reactive to light.  Neck: Normal range of motion. Neck supple. No JVD present.  Cardiovascular: Normal rate, regular rhythm, normal heart sounds and intact distal pulses.  Exam reveals no gallop and no friction rub.   No murmur heard. Pulmonary/Chest: Effort normal and breath sounds normal. No respiratory distress. He has no wheezes. He has no rales. He exhibits no tenderness.  Abdominal: Soft. Bowel sounds are normal. He exhibits no distension. There is no tenderness.  Musculoskeletal: Normal range of motion. He exhibits no edema or tenderness.  Lymphadenopathy:    He has no cervical adenopathy.  Neurological: He is alert and oriented to person, place, and time. Coordination normal.  Skin: Skin is warm and dry. No rash noted. No erythema.  Psychiatric: He has a normal mood and affect. His behavior is normal. Judgment and thought content normal.

## 2015-12-06 NOTE — Assessment & Plan Note (Signed)
2 stents to the mid LAD seen on CT scan Images reviewed with him showing coronary disease, aortic plaque Suggested smoking cessation, compliance with his statin

## 2015-12-06 NOTE — Assessment & Plan Note (Signed)
Prior history of anxiety and depression, dramatic weight loss over the past year Feels better on Wellbutrin, eating more

## 2015-12-10 ENCOUNTER — Telehealth: Payer: Self-pay

## 2015-12-10 ENCOUNTER — Other Ambulatory Visit: Payer: Self-pay | Admitting: Orthopedic Surgery

## 2015-12-10 NOTE — Telephone Encounter (Signed)
Pt left v/m; pt was cleared by cardiologist for surgery; pt wanted Allie Bossier NP to know to expedite getting surgery done ASAP. Unable to reach pt by phone for more info.

## 2015-12-10 NOTE — Telephone Encounter (Signed)
This should move things along, however, i encourage him to notify his surgeon's office that cardiology has cleared him. They must receive paperwork from cardiology before scheduling, so by him calling to ensure the paperwork was received, this would exedite the process.

## 2015-12-10 NOTE — Telephone Encounter (Signed)
Left detail message for patient per DPR and patient may call back if have questions.

## 2016-01-01 ENCOUNTER — Other Ambulatory Visit: Payer: Self-pay

## 2016-01-01 MED ORDER — HYDROCODONE-ACETAMINOPHEN 5-325 MG PO TABS: 1.0000 | ORAL_TABLET | Freq: Four times a day (QID) | ORAL | Status: DC | PRN

## 2016-01-01 NOTE — Telephone Encounter (Signed)
Pt left v/m requesting rx hydrocodone apap. Call when ready for pick up. rx last printed # 50 on 11/15/15. Pt last seen 11/28/15. Pt only has one pill left.

## 2016-01-01 NOTE — Telephone Encounter (Signed)
Message left for patient to return my call.  

## 2016-01-01 NOTE — Telephone Encounter (Signed)
Per DPR, left detail message for patient that Rx is ready for pick up. 

## 2016-01-03 ENCOUNTER — Other Ambulatory Visit (HOSPITAL_COMMUNITY): Payer: Self-pay | Admitting: *Deleted

## 2016-01-03 ENCOUNTER — Encounter (HOSPITAL_COMMUNITY)
Admission: RE | Admit: 2016-01-03 | Discharge: 2016-01-03 | Disposition: A | Discharge status: Home / Self Care | Payer: Managed Care, Other (non HMO) | Source: Ambulatory Visit | Attending: Orthopedic Surgery | Admitting: Orthopedic Surgery

## 2016-01-03 ENCOUNTER — Encounter (HOSPITAL_COMMUNITY): Payer: Self-pay

## 2016-01-03 DIAGNOSIS — Z7982 Long term (current) use of aspirin: Secondary | ICD-10-CM | POA: Insufficient documentation

## 2016-01-03 DIAGNOSIS — Z955 Presence of coronary angioplasty implant and graft: Secondary | ICD-10-CM | POA: Diagnosis not present

## 2016-01-03 DIAGNOSIS — Z01812 Encounter for preprocedural laboratory examination: Secondary | ICD-10-CM | POA: Diagnosis not present

## 2016-01-03 DIAGNOSIS — Z01818 Encounter for other preprocedural examination: Secondary | ICD-10-CM | POA: Diagnosis present

## 2016-01-03 DIAGNOSIS — I2510 Atherosclerotic heart disease of native coronary artery without angina pectoris: Secondary | ICD-10-CM | POA: Diagnosis not present

## 2016-01-03 DIAGNOSIS — M1611 Unilateral primary osteoarthritis, right hip: Secondary | ICD-10-CM | POA: Insufficient documentation

## 2016-01-03 DIAGNOSIS — Z79899 Other long term (current) drug therapy: Secondary | ICD-10-CM | POA: Diagnosis not present

## 2016-01-03 LAB — COMPREHENSIVE METABOLIC PANEL
ALBUMIN: 3.9 g/dL (ref 3.5–5.0)
ALT: 27 U/L (ref 17–63)
ANION GAP: 11 (ref 5–15)
AST: 32 U/L (ref 15–41)
Alkaline Phosphatase: 86 U/L (ref 38–126)
BUN: 18 mg/dL (ref 6–20)
CALCIUM: 9.2 mg/dL (ref 8.9–10.3)
CHLORIDE: 99 mmol/L — AB (ref 101–111)
CO2: 23 mmol/L (ref 22–32)
Creatinine, Ser: 0.78 mg/dL (ref 0.61–1.24)
GFR calc non Af Amer: >60 mL/min (ref >60)
Glucose, Bld: 55 mg/dL — ABNORMAL LOW (ref 65–99)
POTASSIUM: 3.9 mmol/L (ref 3.5–5.1)
SODIUM: 133 mmol/L — AB (ref 135–145)
Total Bilirubin: 0.1 mg/dL — ABNORMAL LOW (ref 0.3–1.2)
Total Protein: 7.2 g/dL (ref 6.5–8.1)

## 2016-01-03 LAB — CBC
HCT: 40.5 % (ref 39.0–52.0)
Hemoglobin: 13.8 g/dL (ref 13.0–17.0)
MCH: 33.9 pg (ref 26.0–34.0)
MCHC: 34.1 g/dL (ref 30.0–36.0)
MCV: 99.5 fL (ref 78.0–100.0)
PLATELETS: 266 10*3/uL (ref 150–400)
RBC: 4.07 MIL/uL — ABNORMAL LOW (ref 4.22–5.81)
RDW: 13.2 % (ref 11.5–15.5)
WBC: 7.9 10*3/uL (ref 4.0–10.5)

## 2016-01-03 LAB — SURGICAL PCR SCREEN
MRSA, PCR: NEGATIVE
STAPHYLOCOCCUS AUREUS: NEGATIVE

## 2016-01-03 NOTE — Pre-Procedure Instructions (Addendum)
Kurt [name].  01/03/2016      [facility] [address], [address] [place] [address] Phone: [phone] Fax: [phone]    Your procedure is scheduled on Tuesday, January 14, 2016   Report to [facility] Entrance "A" Admitting Office at 8:00 AM.   Call this number if you have problems the morning of surgery: [phone]   Any questions prior to day of surgery, please call [phone] between 8 & 4 PM.   Remember:  Do not eat food or drink liquids after midnight Monday, 01/13/16.  Take these medicines the morning of surgery with A SIP OF WATER:xanax, hydrocodone if needed,wellbutrin, carvedilol    STOP all herbel meds, nsaids (aleve,naproxen,advil,ibuprofen) 5 days prior to surgery starting 01/09/16 including aspirin, vitamins   Do not wear jewelry.  Do not wear lotions, powders, or cologne.  You may wear deodorant.  Men may shave face and neck.  Do not bring valuables to the hospital.  [facility] is not responsible for any belongings or valuables.  Contacts, dentures or bridgework may not be worn into surgery.  Leave your suitcase in the car.  After surgery it may be brought to your room.  For patients admitted to the hospital, discharge time will be determined by your treatment team.  Special instructions:  Special Instructions: [place] - Preparing for Surgery  Before surgery, you can play an important role.  Because skin is not sterile, your skin needs to be as free of germs as possible.  You can reduce the number of germs on you skin by washing with CHG (chlorahexidine gluconate) soap before surgery.  CHG is an antiseptic cleaner which kills germs and bonds with the skin to continue killing germs even after washing.  Please DO NOT use if you have an allergy to CHG or antibacterial soaps.  If your skin becomes reddened/irritated stop using the CHG and inform your nurse when you arrive at Short Stay.  Do not  shave (including legs and underarms) for at least 48 hours prior to the first CHG shower.  You may shave your face.  Please follow these instructions carefully:   1.  Shower with CHG Soap the night before surgery and the morning of Surgery.  2.  If you choose to wash your hair, wash your hair first as usual with your normal shampoo.  3.  After you shampoo, rinse your hair and body thoroughly to remove the Shampoo.  4.  Use CHG as you would any other liquid soap.  You can apply chg directly  to the skin and wash gently with scrungie or a clean washcloth.  5.  Apply the CHG Soap to your body ONLY FROM THE NECK DOWN.  Do not use on open wounds or open sores.  Avoid contact with your eyes ears, mouth and genitals (private parts).  Wash genitals (private parts)       with your normal soap.  6.  Wash thoroughly, paying special attention to the area where your surgery will be performed.  7.  Thoroughly rinse your body with warm water from the neck down.  8.  DO NOT shower/wash with your normal soap after using and rinsing off the CHG Soap.  9.  Pat yourself dry with a clean towel.            10.  Wear clean pajamas.            11.  Place  clean sheets on your bed the night of your first shower and do not sleep with pets.  Day of Surgery  Do not apply any lotions/deodorants the morning of surgery.  Please wear clean clothes to the hospital/surgery center.  Please read over the following fact sheets that you were given. Pain Booklet, Coughing and Deep Breathing, MRSA Information and Surgical Site Infection Prevention

## 2016-01-06 NOTE — Progress Notes (Signed)
Anesthesia Follow-up: See my anesthesia note from 11/25/15. Patient was scheduled for right THA by Dr. Mardelle Matte on 11/26/15 but was admitted to [facility] with chest pain. Known history of CAD s/p stents '06 as outlined in my earlier note.   Meds include Xanax, aspirin, Lipitor, Wellbutrin XL, Coreg, Norco, lisinopril.  Since my last note, he ruled out for ACS but stress test showed small region of apical scar with EF 27%, possibly related to artifact. Echo showed EF 35-40%. Chest pain was thought to be related to a panic attack. Out-patient cardiology follow-up recommended. He was seen by cardiologist Dr. 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 He is on b-blocker and ACE inhibitor therapy. ETOH and smoking cessation recommended. Patient reported he has been sober for the past four months. (Of note, his note stated that two stents were noted in patient's mid LAD on CT scan.) Following his 12/06/15 visit, Dr. Rockey Situ wrote: Acceptable risk for upcoming surgery of his hip with Dr. Mardelle Matte No further testing needed. Currently with no angina symptoms Recent stress test showing no ischemia Would suggest limiting IV fluids given low ejection fraction and high risk of acute heart failure  11/26/15 Nuclear Stress Test:  There was no ST segment deviation noted during stress. Pharmacological myocardial perfusion imaging study with no significant Ischemia There is a small region of fixed defect in the apical region, seen at stress than rest. Region concerning for attenuation artifact though unable to exclude small region of old scar. Global hypokinesis with EF estimated at 27% Baseline EKG with abnormal T wave V1 through V4, 1 and aVL No EKG changes concerning for ischemia with stress or in recovery . Low risk scan Consider echocardiogram to evaluate ejection fraction if clinically indicated  11/26/15 Echo: Study Conclusions - Left ventricle: The cavity size was normal. There was  mild concentric hypertrophy. Systolic function was moderately reduced. The estimated ejection fraction was in the range of 35% to 40%. Diffuse hypokinesis. Severe hypokinesis of the apical myocardium. Doppler parameters are consistent with abnormal left ventricular relaxation (grade 1 diastolic dysfunction). - Left atrium: The atrium was normal in size. - Right ventricle: Systolic function was normal. - Pulmonary arteries: Systolic pressure was within the normal range.  12/06/15 EKG: SB at 53 bpm, rightward axis, T wave abnormality, consider anterior ischemia. Possible anterior ischemia also noted on 11/25/15 tracings.  11/25/15 CXR: IMPRESSION: COPD changes without definite acute infiltrate. Suspected RIGHT nipple shadow on 1 of 2 AP views, not seen on second view as above.  10/03/15 Chest CT w/ contrast: IMPRESSION: 1. 6 mm noncalcified nodule in the right lower lobe. 5 mm nodule in the left upper lobe. If the patient is at high risk for bronchogenic carcinoma, follow-up chest CT at 6-12 months is recommended. If the patient is at low risk for bronchogenic carcinoma, follow-up chest CT at 12 months is recommended. This recommendation follows the consensus statement: Guidelines for Management of Small Pulmonary Nodules Detected on CT Scans: A Statement from the [place] as published in Radiology 2005;237:395-400. 2. Paraseptal and centrilobular emphysema.  Preoperative labs noted. Glucose 55 (had worked all night; ate after PAT).   Patient has been cleared by cardiology. If no acute CV/CHF symptoms then I would anticipate that he could proceed as planned. Further evaluation on the day of surgery by his surgeon and anesthesiologist.  Myra Gianotti, PA-C [facility] Short Stay Center/Anesthesiology Phone [phone] 01/06/2016 7:10 PM

## 2016-01-13 MED ORDER — CEFAZOLIN SODIUM-DEXTROSE 2-3 GM-% IV SOLR
2.0000 g | INTRAVENOUS | Status: AC
  Administered 2016-01-14: 2 g via INTRAVENOUS
  Filled 2016-01-13: qty 50

## 2016-01-14 ENCOUNTER — Inpatient Hospital Stay (HOSPITAL_COMMUNITY): Payer: Managed Care, Other (non HMO) | Admitting: Vascular Surgery

## 2016-01-14 ENCOUNTER — Encounter (HOSPITAL_COMMUNITY): Payer: Self-pay

## 2016-01-14 ENCOUNTER — Encounter (HOSPITAL_COMMUNITY)
Admission: RE | Disposition: A | Discharge status: Home Health | Payer: Self-pay | Source: Ambulatory Visit | Attending: Orthopedic Surgery

## 2016-01-14 ENCOUNTER — Inpatient Hospital Stay (HOSPITAL_COMMUNITY): Payer: Managed Care, Other (non HMO) | Admitting: Certified Registered Nurse Anesthetist

## 2016-01-14 ENCOUNTER — Inpatient Hospital Stay (HOSPITAL_COMMUNITY): Payer: Managed Care, Other (non HMO)

## 2016-01-14 ENCOUNTER — Inpatient Hospital Stay (HOSPITAL_COMMUNITY)
Admission: RE | Admit: 2016-01-14 | Discharge: 2016-01-16 | DRG: 470 | Disposition: A | Discharge status: Home Health | Payer: Managed Care, Other (non HMO) | Source: Ambulatory Visit | Attending: Orthopedic Surgery | Admitting: Orthopedic Surgery

## 2016-01-14 DIAGNOSIS — E785 Hyperlipidemia, unspecified: Secondary | ICD-10-CM | POA: Diagnosis present

## 2016-01-14 DIAGNOSIS — M1611 Unilateral primary osteoarthritis, right hip: Secondary | ICD-10-CM | POA: Diagnosis present

## 2016-01-14 DIAGNOSIS — M879 Osteonecrosis, unspecified: Principal | ICD-10-CM | POA: Diagnosis present

## 2016-01-14 DIAGNOSIS — Z87891 Personal history of nicotine dependence: Secondary | ICD-10-CM | POA: Diagnosis not present

## 2016-01-14 DIAGNOSIS — Z955 Presence of coronary angioplasty implant and graft: Secondary | ICD-10-CM | POA: Diagnosis not present

## 2016-01-14 DIAGNOSIS — I2510 Atherosclerotic heart disease of native coronary artery without angina pectoris: Secondary | ICD-10-CM | POA: Diagnosis present

## 2016-01-14 DIAGNOSIS — M87051 Idiopathic aseptic necrosis of right femur: Secondary | ICD-10-CM | POA: Diagnosis present

## 2016-01-14 DIAGNOSIS — M1610 Unilateral primary osteoarthritis, unspecified hip: Secondary | ICD-10-CM

## 2016-01-14 DIAGNOSIS — M87059 Idiopathic aseptic necrosis of unspecified femur: Secondary | ICD-10-CM | POA: Diagnosis present

## 2016-01-14 DIAGNOSIS — I10 Essential (primary) hypertension: Secondary | ICD-10-CM | POA: Diagnosis present

## 2016-01-14 HISTORY — DX: Idiopathic aseptic necrosis of unspecified femur: M87.059

## 2016-01-14 HISTORY — DX: Idiopathic aseptic necrosis of right femur: M87.051

## 2016-01-14 HISTORY — PX: TOTAL HIP ARTHROPLASTY: SHX124

## 2016-01-14 SURGERY — ARTHROPLASTY, HIP, TOTAL,POSTERIOR APPROACH
Anesthesia: General | Laterality: Right

## 2016-01-14 MED ORDER — BISACODYL 10 MG RE SUPP: 10.0000 mg | Freq: Every day | RECTAL | Status: DC | PRN

## 2016-01-14 MED ORDER — KETOROLAC TROMETHAMINE 15 MG/ML IJ SOLN
15.0000 mg | Freq: Four times a day (QID) | INTRAMUSCULAR | Status: AC
  Administered 2016-01-14 – 2016-01-15 (×4): 15 mg via INTRAVENOUS
  Filled 2016-01-14 (×3): qty 1

## 2016-01-14 MED ORDER — ONDANSETRON HCL 4 MG/2ML IJ SOLN
INTRAMUSCULAR | Status: DC | PRN
  Administered 2016-01-14: 4 mg via INTRAVENOUS

## 2016-01-14 MED ORDER — DEXAMETHASONE SODIUM PHOSPHATE 10 MG/ML IJ SOLN
10.0000 mg | Freq: Once | INTRAMUSCULAR | Status: AC
  Administered 2016-01-15: 10 mg via INTRAVENOUS
  Filled 2016-01-14: qty 1

## 2016-01-14 MED ORDER — FENTANYL CITRATE (PF) 250 MCG/5ML IJ SOLN
INTRAMUSCULAR | Status: AC
  Filled 2016-01-14: qty 5

## 2016-01-14 MED ORDER — DIPHENHYDRAMINE HCL 12.5 MG/5ML PO ELIX: 12.5000 mg | ORAL_SOLUTION | ORAL | Status: DC | PRN

## 2016-01-14 MED ORDER — RIVAROXABAN 10 MG PO TABS
10.0000 mg | ORAL_TABLET | Freq: Every day | ORAL | Status: DC
  Administered 2016-01-15 – 2016-01-16 (×2): 10 mg via ORAL
  Filled 2016-01-14 (×3): qty 1

## 2016-01-14 MED ORDER — ALUM & MAG HYDROXIDE-SIMETH 200-200-20 MG/5ML PO SUSP: 30.0000 mL | ORAL | Status: DC | PRN

## 2016-01-14 MED ORDER — METHOCARBAMOL 500 MG PO TABS
ORAL_TABLET | ORAL | Status: AC
  Administered 2016-01-14: 500 mg via ORAL
  Filled 2016-01-14: qty 1

## 2016-01-14 MED ORDER — ONDANSETRON HCL 4 MG PO TABS: 4.0000 mg | ORAL_TABLET | Freq: Three times a day (TID) | ORAL | Status: DC | PRN

## 2016-01-14 MED ORDER — ATORVASTATIN CALCIUM 20 MG PO TABS
20.0000 mg | ORAL_TABLET | Freq: Every day | ORAL | Status: DC
  Administered 2016-01-15: 20 mg via ORAL
  Filled 2016-01-14 (×3): qty 1

## 2016-01-14 MED ORDER — ASPIRIN 81 MG PO CHEW
81.0000 mg | CHEWABLE_TABLET | Freq: Every day | ORAL | Status: DC
  Administered 2016-01-15 – 2016-01-16 (×2): 81 mg via ORAL
  Filled 2016-01-14 (×2): qty 1

## 2016-01-14 MED ORDER — PROMETHAZINE HCL 25 MG/ML IJ SOLN: 6.2500 mg | INTRAMUSCULAR | Status: DC | PRN

## 2016-01-14 MED ORDER — BACLOFEN 10 MG PO TABS: 10.0000 mg | ORAL_TABLET | Freq: Three times a day (TID) | ORAL | Status: DC

## 2016-01-14 MED ORDER — PROPOFOL 10 MG/ML IV BOLUS
INTRAVENOUS | Status: DC | PRN
  Administered 2016-01-14: 100 mg via INTRAVENOUS

## 2016-01-14 MED ORDER — BUPIVACAINE HCL (PF) 0.25 % IJ SOLN
INTRAMUSCULAR | Status: DC | PRN
  Administered 2016-01-14: 20 mL

## 2016-01-14 MED ORDER — ONDANSETRON HCL 4 MG PO TABS: 4.0000 mg | ORAL_TABLET | Freq: Four times a day (QID) | ORAL | Status: DC | PRN

## 2016-01-14 MED ORDER — HYDROMORPHONE HCL 1 MG/ML IJ SOLN
INTRAMUSCULAR | Status: AC
  Administered 2016-01-14: 0.5 mg via INTRAVENOUS
  Filled 2016-01-14: qty 1

## 2016-01-14 MED ORDER — ADULT MULTIVITAMIN W/MINERALS CH
1.0000 | ORAL_TABLET | Freq: Every day | ORAL | Status: DC
  Administered 2016-01-15 – 2016-01-16 (×2): 1 via ORAL
  Filled 2016-01-14 (×2): qty 1

## 2016-01-14 MED ORDER — BUPROPION HCL ER (XL) 150 MG PO TB24
150.0000 mg | ORAL_TABLET | Freq: Every day | ORAL | Status: DC
  Administered 2016-01-15 – 2016-01-16 (×2): 150 mg via ORAL
  Filled 2016-01-14 (×2): qty 1

## 2016-01-14 MED ORDER — OXYCODONE-ACETAMINOPHEN 10-325 MG PO TABS: 1.0000 | ORAL_TABLET | Freq: Four times a day (QID) | ORAL | Status: DC | PRN

## 2016-01-14 MED ORDER — ACETAMINOPHEN 650 MG RE SUPP: 650.0000 mg | Freq: Four times a day (QID) | RECTAL | Status: DC | PRN

## 2016-01-14 MED ORDER — OXYCODONE HCL 5 MG PO TABS
ORAL_TABLET | ORAL | Status: AC
  Administered 2016-01-14: 10 mg via ORAL
  Filled 2016-01-14: qty 2

## 2016-01-14 MED ORDER — SUGAMMADEX SODIUM 200 MG/2ML IV SOLN
INTRAVENOUS | Status: DC | PRN
  Administered 2016-01-14: 200 mg via INTRAVENOUS

## 2016-01-14 MED ORDER — POTASSIUM CHLORIDE IN NACL 20-0.45 MEQ/L-% IV SOLN
INTRAVENOUS | Status: DC
  Administered 2016-01-14: 18:00:00 via INTRAVENOUS
  Filled 2016-01-14 (×5): qty 1000

## 2016-01-14 MED ORDER — ROCURONIUM BROMIDE 50 MG/5ML IV SOLN
INTRAVENOUS | Status: AC
  Filled 2016-01-14: qty 1

## 2016-01-14 MED ORDER — SODIUM CHLORIDE 0.9 % IR SOLN
Status: DC | PRN
  Administered 2016-01-14: 1000 mL

## 2016-01-14 MED ORDER — METHOCARBAMOL 1000 MG/10ML IJ SOLN
500.0000 mg | Freq: Four times a day (QID) | INTRAVENOUS | Status: DC | PRN
  Filled 2016-01-14: qty 5

## 2016-01-14 MED ORDER — SUGAMMADEX SODIUM 200 MG/2ML IV SOLN
INTRAVENOUS | Status: AC
  Filled 2016-01-14: qty 2

## 2016-01-14 MED ORDER — HYDROMORPHONE HCL 1 MG/ML IJ SOLN
0.2500 mg | INTRAMUSCULAR | Status: DC | PRN
  Administered 2016-01-14 (×3): 0.5 mg via INTRAVENOUS

## 2016-01-14 MED ORDER — DOCUSATE SODIUM 100 MG PO CAPS
100.0000 mg | ORAL_CAPSULE | Freq: Two times a day (BID) | ORAL | Status: DC
  Administered 2016-01-14 – 2016-01-16 (×4): 100 mg via ORAL
  Filled 2016-01-14 (×4): qty 1

## 2016-01-14 MED ORDER — LIDOCAINE HCL (CARDIAC) 20 MG/ML IV SOLN
INTRAVENOUS | Status: DC | PRN
  Administered 2016-01-14: 100 mg via INTRAVENOUS

## 2016-01-14 MED ORDER — MIDAZOLAM HCL 2 MG/2ML IJ SOLN
INTRAMUSCULAR | Status: AC
  Filled 2016-01-14: qty 2

## 2016-01-14 MED ORDER — MAGNESIUM CITRATE PO SOLN: 1.0000 | Freq: Once | ORAL | Status: DC | PRN

## 2016-01-14 MED ORDER — POLYETHYLENE GLYCOL 3350 17 G PO PACK
17.0000 g | PACK | Freq: Every day | ORAL | Status: DC | PRN
  Administered 2016-01-16: 17 g via ORAL
  Filled 2016-01-14: qty 1

## 2016-01-14 MED ORDER — METOCLOPRAMIDE HCL 5 MG PO TABS: 5.0000 mg | ORAL_TABLET | Freq: Three times a day (TID) | ORAL | Status: DC | PRN

## 2016-01-14 MED ORDER — ALPRAZOLAM 0.25 MG PO TABS: 0.2500 mg | ORAL_TABLET | Freq: Two times a day (BID) | ORAL | Status: DC | PRN

## 2016-01-14 MED ORDER — CARVEDILOL 3.125 MG PO TABS
3.1250 mg | ORAL_TABLET | Freq: Two times a day (BID) | ORAL | Status: DC
  Administered 2016-01-14 – 2016-01-16 (×4): 3.125 mg via ORAL
  Filled 2016-01-14 (×3): qty 1

## 2016-01-14 MED ORDER — LACTATED RINGERS IV SOLN
INTRAVENOUS | Status: DC
Start: 2016-01-14 — End: 2016-01-14
  Administered 2016-01-14: 09:00:00 via INTRAVENOUS

## 2016-01-14 MED ORDER — OXYCODONE HCL 5 MG PO TABS
10.0000 mg | ORAL_TABLET | Freq: Once | ORAL | Status: AC | PRN
  Administered 2016-01-14: 10 mg via ORAL

## 2016-01-14 MED ORDER — OXYCODONE HCL 5 MG/5ML PO SOLN: 5.0000 mg | Freq: Once | ORAL | Status: AC | PRN

## 2016-01-14 MED ORDER — KETOROLAC TROMETHAMINE 15 MG/ML IJ SOLN
INTRAMUSCULAR | Status: AC
Start: 2016-01-14 — End: 2016-01-14
  Administered 2016-01-14: 15 mg via INTRAVENOUS
  Filled 2016-01-14: qty 1

## 2016-01-14 MED ORDER — LISINOPRIL 5 MG PO TABS
5.0000 mg | ORAL_TABLET | Freq: Every day | ORAL | Status: DC
  Administered 2016-01-15 – 2016-01-16 (×2): 5 mg via ORAL
  Filled 2016-01-14 (×2): qty 1

## 2016-01-14 MED ORDER — CEFAZOLIN SODIUM-DEXTROSE 2-3 GM-% IV SOLR
2.0000 g | Freq: Four times a day (QID) | INTRAVENOUS | Status: AC
  Administered 2016-01-14 – 2016-01-15 (×2): 2 g via INTRAVENOUS
  Filled 2016-01-14 (×2): qty 50

## 2016-01-14 MED ORDER — HYDROMORPHONE HCL 1 MG/ML IJ SOLN
1.0000 mg | INTRAMUSCULAR | Status: DC | PRN
  Administered 2016-01-15 (×2): 1 mg via INTRAVENOUS
  Filled 2016-01-14 (×3): qty 1

## 2016-01-14 MED ORDER — SENNA 8.6 MG PO TABS
1.0000 | ORAL_TABLET | Freq: Two times a day (BID) | ORAL | Status: DC
  Administered 2016-01-14 – 2016-01-16 (×4): 8.6 mg via ORAL
  Filled 2016-01-14 (×4): qty 1

## 2016-01-14 MED ORDER — SENNA-DOCUSATE SODIUM 8.6-50 MG PO TABS: 2.0000 | ORAL_TABLET | Freq: Every day | ORAL | Status: DC

## 2016-01-14 MED ORDER — HYDROMORPHONE HCL 1 MG/ML IJ SOLN
INTRAMUSCULAR | Status: AC
  Filled 2016-01-14: qty 1

## 2016-01-14 MED ORDER — FENTANYL CITRATE (PF) 100 MCG/2ML IJ SOLN
INTRAMUSCULAR | Status: DC | PRN
  Administered 2016-01-14: 100 ug via INTRAVENOUS
  Administered 2016-01-14 (×3): 50 ug via INTRAVENOUS
  Administered 2016-01-14: 100 ug via INTRAVENOUS

## 2016-01-14 MED ORDER — METHOCARBAMOL 500 MG PO TABS
500.0000 mg | ORAL_TABLET | Freq: Four times a day (QID) | ORAL | Status: DC | PRN
  Administered 2016-01-14 – 2016-01-16 (×7): 500 mg via ORAL
  Filled 2016-01-14 (×6): qty 1

## 2016-01-14 MED ORDER — MENTHOL 3 MG MT LOZG: 1.0000 | LOZENGE | OROMUCOSAL | Status: DC | PRN

## 2016-01-14 MED ORDER — RIVAROXABAN 10 MG PO TABS: 10.0000 mg | ORAL_TABLET | Freq: Every day | ORAL | Status: DC

## 2016-01-14 MED ORDER — ONDANSETRON HCL 4 MG/2ML IJ SOLN: 4.0000 mg | Freq: Four times a day (QID) | INTRAMUSCULAR | Status: DC | PRN

## 2016-01-14 MED ORDER — OXYCODONE HCL 5 MG PO TABS
5.0000 mg | ORAL_TABLET | ORAL | Status: DC | PRN
  Administered 2016-01-14: 5 mg via ORAL
  Administered 2016-01-15 – 2016-01-16 (×10): 10 mg via ORAL
  Filled 2016-01-14 (×2): qty 2
  Filled 2016-01-14: qty 1
  Filled 2016-01-14 (×9): qty 2

## 2016-01-14 MED ORDER — ACETAMINOPHEN 325 MG PO TABS
650.0000 mg | ORAL_TABLET | Freq: Four times a day (QID) | ORAL | Status: DC | PRN
  Administered 2016-01-15: 650 mg via ORAL
  Filled 2016-01-14: qty 2

## 2016-01-14 MED ORDER — BUPIVACAINE HCL (PF) 0.25 % IJ SOLN
INTRAMUSCULAR | Status: AC
  Filled 2016-01-14: qty 30

## 2016-01-14 MED ORDER — METOCLOPRAMIDE HCL 5 MG/ML IJ SOLN: 5.0000 mg | Freq: Three times a day (TID) | INTRAMUSCULAR | Status: DC | PRN

## 2016-01-14 MED ORDER — PHENYLEPHRINE HCL 10 MG/ML IJ SOLN
INTRAMUSCULAR | Status: DC | PRN
  Administered 2016-01-14 (×4): 80 ug via INTRAVENOUS

## 2016-01-14 MED ORDER — ROCURONIUM BROMIDE 100 MG/10ML IV SOLN
INTRAVENOUS | Status: DC | PRN
  Administered 2016-01-14: 10 mg via INTRAVENOUS
  Administered 2016-01-14: 50 mg via INTRAVENOUS
  Administered 2016-01-14: 10 mg via INTRAVENOUS

## 2016-01-14 MED ORDER — LACTATED RINGERS IV SOLN: INTRAVENOUS | Status: DC | PRN

## 2016-01-14 MED ORDER — ZOLPIDEM TARTRATE 5 MG PO TABS
5.0000 mg | ORAL_TABLET | Freq: Every evening | ORAL | Status: DC | PRN
  Administered 2016-01-15 – 2016-01-16 (×2): 5 mg via ORAL
  Filled 2016-01-14 (×2): qty 1

## 2016-01-14 MED ORDER — MIDAZOLAM HCL 5 MG/5ML IJ SOLN
INTRAMUSCULAR | Status: DC | PRN
  Administered 2016-01-14: 2 mg via INTRAVENOUS

## 2016-01-14 MED ORDER — PHENOL 1.4 % MT LIQD: 1.0000 | OROMUCOSAL | Status: DC | PRN

## 2016-01-14 SURGICAL SUPPLY — 63 items
BIT DRILL 5/64X5 DISP (BIT) ×2 IMPLANT
BLADE SAW SAG 73X25 THK (BLADE) ×1
BLADE SAW SGTL 73X25 THK (BLADE) ×1 IMPLANT
BRUSH FEMORAL CANAL (MISCELLANEOUS) IMPLANT
CAPT HIP TOTAL 2 ×2 IMPLANT
CLSR STERI-STRIP ANTIMIC 1/2X4 (GAUZE/BANDAGES/DRESSINGS) ×2 IMPLANT
COVER SURGICAL LIGHT HANDLE (MISCELLANEOUS) ×2 IMPLANT
DRAPE IMP U-DRAPE 54X76 (DRAPES) IMPLANT
DRAPE INCISE IOBAN 66X45 STRL (DRAPES) IMPLANT
DRAPE ORTHO SPLIT 77X108 STRL (DRAPES) ×2
DRAPE PROXIMA HALF (DRAPES) IMPLANT
DRAPE SURG ORHT 6 SPLT 77X108 (DRAPES) ×2 IMPLANT
DRAPE U-SHAPE 47X51 STRL (DRAPES) ×2 IMPLANT
DRSG MEPILEX BORDER 4X12 (GAUZE/BANDAGES/DRESSINGS) IMPLANT
DRSG MEPILEX BORDER 4X8 (GAUZE/BANDAGES/DRESSINGS) ×2 IMPLANT
DRSG PAD ABDOMINAL 8X10 ST (GAUZE/BANDAGES/DRESSINGS) IMPLANT
DURAPREP 26ML APPLICATOR (WOUND CARE) ×4 IMPLANT
ELECT BLADE 4.0 EZ CLEAN MEGAD (MISCELLANEOUS) ×2
ELECT CAUTERY BLADE 6.4 (BLADE) ×2 IMPLANT
ELECT REM PT RETURN 9FT ADLT (ELECTROSURGICAL) ×2
ELECTRODE BLDE 4.0 EZ CLN MEGD (MISCELLANEOUS) ×1 IMPLANT
ELECTRODE REM PT RTRN 9FT ADLT (ELECTROSURGICAL) ×1 IMPLANT
GLOVE BIOGEL PI IND STRL 8 (GLOVE) ×1 IMPLANT
GLOVE BIOGEL PI INDICATOR 8 (GLOVE) ×1
GLOVE BIOGEL PI ORTHO PRO SZ8 (GLOVE) ×1
GLOVE ORTHO TXT STRL SZ7.5 (GLOVE) ×2 IMPLANT
GLOVE PI ORTHO PRO STRL SZ8 (GLOVE) ×1 IMPLANT
GLOVE SURG ORTHO 8.0 STRL STRW (GLOVE) ×2 IMPLANT
GOWN STRL REUS W/ TWL XL LVL3 (GOWN DISPOSABLE) ×1 IMPLANT
GOWN STRL REUS W/TWL 2XL LVL3 (GOWN DISPOSABLE) ×2 IMPLANT
GOWN STRL REUS W/TWL XL LVL3 (GOWN DISPOSABLE) ×1
HANDPIECE INTERPULSE COAX TIP (DISPOSABLE)
HOOD PEEL AWAY FACE SHEILD DIS (HOOD) ×6 IMPLANT
KIT BASIN OR (CUSTOM PROCEDURE TRAY) ×2 IMPLANT
KIT ROOM TURNOVER OR (KITS) ×2 IMPLANT
MANIFOLD NEPTUNE II (INSTRUMENTS) ×2 IMPLANT
NDL SUT .5 MAYO 1.404X.05X (NEEDLE) IMPLANT
NEEDLE 18GX1X1/2 (RX/OR ONLY) (NEEDLE) ×2 IMPLANT
NEEDLE HYPO 25GX1X1/2 BEV (NEEDLE) IMPLANT
NEEDLE MAYO TAPER (NEEDLE)
NS IRRIG 1000ML POUR BTL (IV SOLUTION) ×2 IMPLANT
PACK TOTAL JOINT (CUSTOM PROCEDURE TRAY) ×2 IMPLANT
PAD ARMBOARD 7.5X6 YLW CONV (MISCELLANEOUS) ×4 IMPLANT
PILLOW ABDUCTION HIP (SOFTGOODS) ×2 IMPLANT
PRESSURIZER FEMORAL UNIV (MISCELLANEOUS) IMPLANT
RETRIEVER SUT HEWSON (MISCELLANEOUS) ×2 IMPLANT
SET HNDPC FAN SPRY TIP SCT (DISPOSABLE) IMPLANT
SPONGE LAP 4X18 X RAY DECT (DISPOSABLE) ×2 IMPLANT
SUCTION FRAZIER TIP 10 FR DISP (SUCTIONS) ×2 IMPLANT
SUT FIBERWIRE #2 38 REV NDL BL (SUTURE) ×6
SUT MNCRL AB 4-0 PS2 18 (SUTURE) IMPLANT
SUT VIC AB 0 CT1 27 (SUTURE) ×1
SUT VIC AB 0 CT1 27XBRD ANBCTR (SUTURE) ×1 IMPLANT
SUT VIC AB 2-0 CT1 27 (SUTURE) ×1
SUT VIC AB 2-0 CT1 TAPERPNT 27 (SUTURE) ×1 IMPLANT
SUT VIC AB 3-0 SH 8-18 (SUTURE) ×2 IMPLANT
SUTURE FIBERWR#2 38 REV NDL BL (SUTURE) ×3 IMPLANT
SYR CONTROL 10ML LL (SYRINGE) ×2 IMPLANT
TOWEL OR 17X24 6PK STRL BLUE (TOWEL DISPOSABLE) ×2 IMPLANT
TOWEL OR 17X26 10 PK STRL BLUE (TOWEL DISPOSABLE) ×2 IMPLANT
TOWER CARTRIDGE SMART MIX (DISPOSABLE) IMPLANT
TRAY FOLEY CATH 14FR (SET/KITS/TRAYS/PACK) IMPLANT
WATER STERILE IRR 1000ML POUR (IV SOLUTION) IMPLANT

## 2016-01-14 NOTE — Op Note (Signed)
01/14/2016  12:50 PM  PATIENT:  Kurt Decarolis Jr.   MRN: [id number]  PRE-OPERATIVE DIAGNOSIS:  Right hip avascular necrosis  POST-OPERATIVE DIAGNOSIS:  Right hip avascular necrosis  PROCEDURE:  Procedure(s): TOTAL HIP ARTHROPLASTY  PREOPERATIVE INDICATIONS:    Kurt Westendorf Jr. is an 50 y.o. male who has a diagnosis of Avascular necrosis of bone of right hip (HCC) and elected for surgical management after failing conservative treatment.  The risks benefits and alternatives were discussed with the patient including but not limited to the risks of nonoperative treatment, versus surgical intervention including infection, bleeding, nerve injury, periprosthetic fracture, the need for revision surgery, dislocation, leg length discrepancy, blood clots, cardiopulmonary complications, morbidity, mortality, among others, and they were willing to proceed.     OPERATIVE REPORT     SURGEON:  Joshua Landau, MD    ASSISTANT:  Brandon Parry, OPA-C  (Present throughout the entire procedure,  necessary for completion of procedure in a timely manner, assisting with retraction, instrumentation, and closure)     ANESTHESIA:  General    COMPLICATIONS:  None.     COMPONENTS:  Depuy [place] Press fit femur size 6 with a 36  mm + 1.5 head ball and a gription acetabular shell size 52 with a 10 degree lipped polyethylene liner  Unique aspects of the case: Access to the acetabulum was actually fairly difficult, there was a significant amount of hypertrophic pulvinar and capsular tissue. The head did have some collapse. I did not medialize very much, but did have excellent coverage.    PROCEDURE IN DETAIL:   The patient was met in the holding area and  identified.  The appropriate hip was identified and marked at the operative site.  The patient was then transported to the OR  and  placed under general anesthesia.  At that point, the patient was  placed in the lateral decubitus position with the operative side  up and  secured to the operating room table and all bony prominences padded.     The operative lower extremity was prepped from the iliac crest to the distal leg.  Sterile draping was performed.  Time out was performed prior to incision.      A routine posterolateral approach was utilized via sharp dissection  carried down to the subcutaneous tissue.  Gross bleeders were Bovie coagulated.  The iliotibial band was identified and incised along the length of the skin incision.  Self-retaining retractors were  inserted.  With the hip internally rotated, the short external rotators  were identified. The piriformis and capsule was tagged with FiberWire, and the hip capsule released in a T-type fashion.  The femoral neck was exposed, and I resected the femoral neck using the appropriate jig. This was performed at approximately a thumb's breadth above the lesser trochanter.    I then exposed the deep acetabulum, cleared out any tissue including the ligamentum teres.  A wing retractor was placed.  After adequate visualization, I excised the labrum, and then sequentially reamed.  I placed the trial acetabulum, which seated nicely, and then impacted the real cup into place.  Appropriate version and inclination was confirmed clinically matching their bony anatomy, and also with the use of the jig.  A trial polyethylene liner was placed and the wing retractor removed.    I then prepared the proximal femur using the cookie-cutter, the lateralizing reamer, and then sequentially reamed and broached.  A trial broach, neck, and head was utilized, and I reduced the hip and   it was found to have excellent stability with functional range of motion. The trial components were then removed, and the real polyethylene liner was placed with the lip directed posteriorly.  I then impacted the real femoral prosthesis into place into the appropriate version, slightly anteverted to the normal anatomy, and I impacted the real head  ball into place. The hip was then reduced and taken through functional range of motion and found to have excellent stability. Leg lengths were restored.  I then used a 2 mm drill bits to pass the FiberWire suture from the capsule and piriformis through the greater trochanter, and secured this. Excellent posterior capsular repair was achieved. I also closed the T in the capsule.  I then irrigated the hip copiously again with pulse lavage, and repaired the fascia with Vicryl, followed by Vicryl for the subcutaneous tissue, Monocryl for the skin, Steri-Strips and sterile gauze. The wounds were injected. The patient was then awakened and returned to PACU in stable and satisfactory condition. There were no complications.  [name], MD Orthopedic Surgeon [phone]   01/14/2016 12:50 PM

## 2016-01-14 NOTE — Progress Notes (Signed)
Pt admitted from pacu this evening s/p right total hip replacement. Alert, oriented and able to voice needs. Abduction place in place. No concerns at this time

## 2016-01-14 NOTE — Anesthesia Procedure Notes (Signed)
Procedure Name: Intubation Date/Time: 01/14/2016 10:47 AM Performed by: [facility] Pre-anesthesia Checklist: Patient identified, Emergency Drugs available, Suction available, Patient being monitored and Timeout performed Patient Re-evaluated:Patient Re-evaluated prior to inductionOxygen Delivery Method: Circle system utilized Preoxygenation: Pre-oxygenation with 100% oxygen Intubation Type: IV induction Ventilation: Mask ventilation without difficulty Laryngoscope Size: Mac and 4 Grade View: Grade I Tube type: Oral Tube size: 7.5 mm Number of attempts: 1 Airway Equipment and Method: Stylet Placement Confirmation: ETT inserted through vocal cords under direct vision,  positive ETCO2 and breath sounds checked- equal and bilateral Secured at: 22 cm Tube secured with: Tape Dental Injury: Teeth and Oropharynx as per pre-operative assessment

## 2016-01-14 NOTE — H&P (Signed)
PREOPERATIVE H&P  Chief Complaint: right hip avascular necrosis/pain  HPI: Kurt [name]. is a 50 y.o. male who presents for preoperative history and physical with a diagnosis of djd right hip. Symptoms are rated as moderate to severe, and have been worsening.  This is significantly impairing activities of daily living.  He has elected for surgical management. He has a history of severe allergies, treated with prednisone, also history of etoh.    He has failed activity modification, anti-inflammatories, and assistive devices, declined injections.  Preoperative X-rays demonstrate end stage degenerative changes with osteophyte formation, loss of joint space, subchondral sclerosis.   Past Medical History  Diagnosis Date  . Coronary artery disease   . Hypertension   . 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 Thyroid disease     reports that he has been low in the past but since he has stoped ETOH it has been normal  . 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([place])   . History of MI (myocardial infarction) 09/12/2015  . Coronary disease 09/12/2015  . Hyperlipidemia    Past Surgical History  Procedure Laterality Date  . Coronary stent placement    . Tonsillectomy      as a child   Social History   Social History  . Marital Status: Legally Separated    Spouse Name: N/A  . Number of Children: N/A  . Years of Education: N/A   Social History Main Topics  . Smoking status: Former Smoker -- 1.50 packs/day for 20 years    Types: Cigarettes    Quit date: 11/25/2015  . Smokeless tobacco: Never Used  . Alcohol Use: 0.0 oz/week    0 Standard drinks or equivalent per week     Comment: 4 months-recovering alcoholic per pt  . Drug Use: No  . Sexual Activity: Not on file   Other Topics Concern  . Not on file   Social History Narrative   Separated.   Works as a Optician, dispensing.   Enjoys fishing, Proofreader, golfing.      Family History  Problem Relation Age of Onset  . Cancer Mother    . Dementia Father   . Cancer Maternal Grandmother   . Cancer Maternal Grandfather   . Cancer Paternal Grandmother   . Cancer Paternal Grandfather    Allergies  Allergen Reactions  . Peanut-Containing Drug Products Anaphylaxis  . Erythromycin Other (See Comments)    Unknown childhood allergy   Prior to Admission medications   Medication Sig Start Date End Date Taking? Authorizing Provider  ALPRAZolam (XANAX) 0.25 MG tablet Take 1 tablet (0.25 mg total) by mouth 2 (two) times daily as needed for anxiety. 11/26/15  Yes Loletha Grayer, MD  aspirin 500 MG tablet Take 1,000 mg by mouth every 6 (six) hours as needed for pain.    Yes Historical Provider, MD  aspirin 81 MG chewable tablet Chew 1 tablet (81 mg total) by mouth daily. 11/26/15  Yes Loletha Grayer, MD  atorvastatin (LIPITOR) 20 MG tablet Take 1 tablet (20 mg total) by mouth daily at 6 PM. 12/06/15  Yes Minna Merritts, MD  buPROPion (WELLBUTRIN XL) 150 MG 24 hr tablet Take 1 tablet (150 mg total) by mouth daily. 11/28/15  Yes Pleas Koch, NP  carvedilol (COREG) 3.125 MG tablet Take 1 tablet (3.125 mg total) by mouth 2 (two) times daily with a meal. Patient taking differently: Take 3.125 mg by mouth daily.  12/06/15  Yes Minna Merritts, MD  HYDROcodone-acetaminophen (NORCO/VICODIN) [phone]  MG tablet Take 1 tablet by mouth every 6 (six) hours as needed for moderate pain. 01/01/16  Yes Pleas Koch, NP  lisinopril (PRINIVIL,ZESTRIL) 5 MG tablet Take 1 tablet (5 mg total) by mouth daily. 12/06/15  Yes Minna Merritts, MD  Multiple Vitamin (MULTIVITAMIN WITH MINERALS) TABS tablet Take 1 tablet by mouth daily.   Yes Historical Provider, MD     Positive ROS: All other systems have been reviewed and were otherwise negative with the exception of those mentioned in the HPI and as above.  Physical Exam: General: Alert, no acute distress Cardiovascular: No pedal edema Respiratory: No cyanosis, no use of accessory musculature GI:  No organomegaly, abdomen is soft and non-tender Skin: No lesions in the area of chief complaint Neurologic: Sensation intact distally Psychiatric: Patient is competent for consent with normal mood and affect Lymphatic: No axillary or cervical lymphadenopathy  MUSCULOSKELETAL: Right hip has loss of motion clinically, with 0-95 and only about 10 of internal Rotation, limited by pain. I think that his motion is more limited by pain than actual stiffness.  MRI demonstrates avascular necrosis of both hips.  Assessment: right hip avascular necrosis  Plan: Plan for Procedure(s): TOTAL HIP ARTHROPLASTY  The risks benefits and alternatives were discussed with the patient including but not limited to the risks of nonoperative treatment, versus surgical intervention including infection, bleeding, nerve injury, periprosthetic fracture, the need for revision surgery, dislocation, leg length discrepancy, blood clots, cardiopulmonary complications, morbidity, mortality, among others, and they were willing to proceed.     Johnny Bridge, MD Cell [phone]   01/14/2016 7:46 AM

## 2016-01-14 NOTE — Anesthesia Preprocedure Evaluation (Addendum)
Anesthesia Evaluation  Patient identified by MRN, date of birth, ID band Patient awake    Reviewed: Allergy & Precautions, NPO status , Patient's Chart, lab work & pertinent test results, reviewed documented beta blocker date and time   Airway Mallampati: II  TM Distance: >3 FB Neck ROM: Full    Dental no notable dental hx. (+) Teeth Intact, Dental Advisory Given   Pulmonary former smoker,    Pulmonary exam normal breath sounds clear to auscultation       Cardiovascular hypertension, Pt. on medications and Pt. on home beta blockers + angina + CAD   Rhythm:Regular Rate:Normal  Echo  - Left ventricle: The cavity size was normal. There was mildconcentric hypertrophy. Systolic function was moderately reduced.The estimated ejection fraction was in the range of 35% to 40%.Diffuse hypokinesis. Severe hypokinesis of the apical myocardium.Doppler parameters are consistent with abnormal left ventricularrelaxation (grade 1 diastolic dysfunction). - Left atrium: The atrium was normal in size. - Right ventricle: Systolic function was normal. - Pulmonary arteries: Systolic pressure was within the normalrange.    Neuro/Psych PSYCHIATRIC DISORDERS negative neurological ROS  negative psych ROS   GI/Hepatic negative GI ROS, (+)     substance abuse  alcohol use, Recovering from alcohol abuse   Endo/Other  Hypothyroidism   Renal/GU negative Renal ROS     Musculoskeletal negative musculoskeletal ROS (+)   Abdominal   Peds  Hematology negative hematology ROS (+)   Anesthesia Other Findings   Reproductive/Obstetrics negative OB ROS                         Anesthesia Physical Anesthesia Plan  ASA: III  Anesthesia Plan: General   Post-op Pain Management:    Induction: Intravenous  Airway Management Planned: Oral ETT  Additional Equipment:   Intra-op Plan:   Post-operative Plan: Possible Post-op  intubation/ventilation  Informed Consent: I have reviewed the patients History and Physical, chart, labs and discussed the procedure including the risks, benefits and alternatives for the proposed anesthesia with the patient or authorized representative who has indicated his/her understanding and acceptance.   Dental advisory given  Plan Discussed with: CRNA, Anesthesiologist and Surgeon  Anesthesia Plan Comments: (Did not want spinal anesthesia option, does have inherent risk of acutely decreasing preload and requiring volume loading in depressed EF function, will opt for GA with volume limited anesthetic  Will run phenylephrine infusion to support perfusion pressure with decreased EF 30%, optimize anesthesia level, multimodal pain therapy  Does smoke daily, lungs baseline)      Anesthesia Quick Evaluation

## 2016-01-14 NOTE — Transfer of Care (Signed)
Immediate Anesthesia Transfer of Care Note  Patient: Kurt [name].  Procedure(s) Performed: Procedure(s): TOTAL HIP ARTHROPLASTY (Right)  Patient Location: PACU  Anesthesia Type:General  Level of Consciousness: awake, alert , oriented and patient cooperative  Airway & Oxygen Therapy: Patient Spontanous Breathing and Patient connected to nasal cannula oxygen  Post-op Assessment: Report given to RN, Post -op Vital signs reviewed and stable and Patient moving all extremities X 4  Post vital signs: Reviewed and stable  Last Vitals:  Filed Vitals:   01/14/16 0809 01/14/16 0827  BP: 153/86   Pulse: 70   Temp: 37.6 C 37 C  Resp: 20     Complications: No apparent anesthesia complications

## 2016-01-14 NOTE — Anesthesia Postprocedure Evaluation (Signed)
Anesthesia Post Note  Patient: Kurt [name].  Procedure(s) Performed: Procedure(s) (LRB): TOTAL HIP ARTHROPLASTY (Right)  Patient location during evaluation: PACU Anesthesia Type: General Level of consciousness: awake and alert Pain management: pain level controlled Vital Signs Assessment: post-procedure vital signs reviewed and stable Respiratory status: spontaneous breathing, nonlabored ventilation, respiratory function stable and patient connected to nasal cannula oxygen Cardiovascular status: blood pressure returned to baseline and stable Postop Assessment: no signs of nausea or vomiting Anesthetic complications: no    Last Vitals:  Filed Vitals:   01/14/16 1323 01/14/16 1345  BP: 135/86 141/86  Pulse: 79 73  Temp: 36.3 C   Resp: 12 72    Last Pain:  Filed Vitals:   01/14/16 1356  PainSc: 10-Worst pain ever                 Zenaida Deed

## 2016-01-15 ENCOUNTER — Encounter (HOSPITAL_COMMUNITY): Payer: Self-pay | Admitting: Orthopedic Surgery

## 2016-01-15 LAB — CBC
HCT: 33.3 % — ABNORMAL LOW (ref 39.0–52.0)
Hemoglobin: 11.4 g/dL — ABNORMAL LOW (ref 13.0–17.0)
MCH: 34.1 pg — AB (ref 26.0–34.0)
MCHC: 34.2 g/dL (ref 30.0–36.0)
MCV: 99.7 fL (ref 78.0–100.0)
PLATELETS: 217 10*3/uL (ref 150–400)
RBC: 3.34 MIL/uL — ABNORMAL LOW (ref 4.22–5.81)
RDW: 13.1 % (ref 11.5–15.5)
WBC: 8.8 10*3/uL (ref 4.0–10.5)

## 2016-01-15 LAB — BASIC METABOLIC PANEL
Anion gap: 7 (ref 5–15)
BUN: 12 mg/dL (ref 6–20)
CALCIUM: 8.3 mg/dL — AB (ref 8.9–10.3)
CO2: 25 mmol/L (ref 22–32)
Chloride: 98 mmol/L — ABNORMAL LOW (ref 101–111)
Creatinine, Ser: 0.74 mg/dL (ref 0.61–1.24)
GFR calc Af Amer: >60 mL/min (ref >60)
GLUCOSE: 115 mg/dL — AB (ref 65–99)
Potassium: 3.7 mmol/L (ref 3.5–5.1)
Sodium: 130 mmol/L — ABNORMAL LOW (ref 135–145)

## 2016-01-15 NOTE — Care Management Note (Signed)
Case Management Note  Patient Details  Name: Kurt [name]. MRN: [id number] Date of Birth: 1966/04/14  Subjective/Objective:                    Action/Plan:  Confirmed face sheet information with patient  Expected Discharge Date:                  Expected Discharge Plan:  [place]  In-House Referral:     Discharge planning Services  CM Consult  Post Acute Care Choice:  Home Health, Durable Medical Equipment Choice offered to:  Patient  DME Arranged:  Walker rolling, 3-N-1 DME Agency:  [facility]:  PT, OT HH Agency:  [facility]  Status of Service:  Completed, signed off  Medicare Important Message Given:    Date Medicare IM Given:    Medicare IM give by:    Date Additional Medicare IM Given:    Additional Medicare Important Message give by:     If discussed at [place] of Stay Meetings, dates discussed:    Additional Comments:  Marilu Favre, RN 01/15/2016, 11:25 AM

## 2016-01-15 NOTE — Progress Notes (Signed)
Patient ID: Kurt Noblett., male   DOB: 01/18/1966, 50 y.o.   MRN: [id number]     Subjective:  Patient reports pain as mild to moderate.  Patient sitting up in be and in no acute distress.  Denies any CP or SOB  Objective:   VITALS:   Filed Vitals:   01/14/16 1743 01/14/16 2142 01/15/16 0512 01/15/16 0645  BP: 139/85 108/75 115/74   Pulse: 74 73 99   Temp: 97.9 F (36.6 C) 99.5 F (37.5 C) 103.2 F (39.6 C) 99.4 F (37.4 C)  TempSrc: Oral Oral Oral Oral  Resp: 17 15 16    Height:      Weight:      SpO2: 99% 99% 97%     ABD soft Sensation intact distally Dorsiflexion/Plantar flexion intact Incision: dressing C/D/I and no drainage Good foot and ankle motion  Lab Results  Component Value Date   WBC 8.8 01/15/2016   HGB 11.4* 01/15/2016   HCT 33.3* 01/15/2016   MCV 99.7 01/15/2016   PLT 217 01/15/2016   BMET    Component Value Date/Time   NA 130* 01/15/2016 0550   K 3.7 01/15/2016 0550   CL 98* 01/15/2016 0550   CO2 25 01/15/2016 0550   GLUCOSE 115* 01/15/2016 0550   BUN 12 01/15/2016 0550   CREATININE 0.74 01/15/2016 0550   CALCIUM 8.3* 01/15/2016 0550   GFRNONAA >60 01/15/2016 0550   GFRAA >60 01/15/2016 0550     Assessment/Plan: 1 Day Post-Op   Principal Problem:   Avascular necrosis of bone of right hip (HCC) Active Problems:   Avascular necrosis of hip ([place])   Advance diet Up with therapy Plan for discharge tomorrow WBAT Dry Dressing PRN   [name] 01/15/2016, 8:22 AM  Seen and agree.  Probable dc home tomorrow.  He has not been drinking, and should not need CIWA.  [name], MD Cell [phone]

## 2016-01-15 NOTE — Evaluation (Signed)
Physical Therapy Evaluation Patient Details Name: Kurt [name]. 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  Clinical Impression   Pt is s/p THA resulting in the deficits listed below (see PT Problem List).   Pt will benefit from skilled PT to increase their independence and safety with mobility to allow discharge to the venue listed below.      Follow Up Recommendations Home health PT;Supervision - Intermittent    Equipment Recommendations  Rolling walker with 5" wheels;3in1 (PT)    Recommendations for Other Services OT consult     Precautions / Restrictions Precautions Precautions: Posterior Hip Precaution Booklet Issued: Yes (comment) Precaution Comments: Educated on Posterior Prec Restrictions RLE Weight Bearing: Weight bearing as tolerated      Mobility  Bed Mobility Overal bed mobility: Needs Assistance Bed Mobility: Supine to Sit     Supine to sit: Min guard     General bed mobility comments: CUes for technqiue and precautions; moving quite well  Transfers Overall transfer level: Needs assistance Equipment used: Rolling walker (2 wheeled) Transfers: Sit to/from Stand Sit to Stand: Min guard (without physical contact)         General transfer comment: verbal and demo cues for postioning for hip prec; good rise  Ambulation/Gait Ambulation/Gait assistance: Min guard Ambulation Distance (Feet): 30 Feet Assistive device: Rolling walker (2 wheeled) Gait Pattern/deviations: Step-through pattern     General Gait Details: Cues for gait sequence and technique; very  painful and cramping and tending to keep NWB right and advance both feet at the same time; able to correct and individually step with cues  Stairs            Wheelchair Mobility    Modified Rankin (Stroke Patients Only)       Balance Overall balance assessment: No apparent balance deficits (not formally assessed)                                           Pertinent Vitals/Pain Pain Assessment: 0-10 Pain Score: 7  Pain Location: L hip Pain Descriptors / Indicators: Cramping Pain Intervention(s): Limited activity within patient's tolerance;Monitored during session;Repositioned;Patient requesting pain meds-RN notified    Home Living Family/patient expects to be discharged to:: Private residence Living Arrangements: Alone Available Help at Discharge: Family;Friend(s);Available PRN/intermittently Type of Home: House Home Access: Stairs to enter Entrance Stairs-Rails: Psychiatric nurse of Steps: 4 Home Layout: One level Home Equipment: Other (comment) (to be determined)      Prior Function Level of Independence: Independent               Hand Dominance        Extremity/Trunk Assessment   Upper Extremity Assessment: Overall WFL for tasks assessed           Lower Extremity Assessment: RLE deficits/detail RLE Deficits / Details: Grossly decr AROM and strength, limited by pain and cramping postop       Communication   Communication: No difficulties  Cognition Arousal/Alertness: Awake/alert Behavior During Therapy: WFL for tasks assessed/performed Overall Cognitive Status: Within Functional Limits for tasks assessed  General Comments      Exercises Total Joint Exercises Ankle Circles/Pumps: AROM;Both;20 reps Quad Sets: AROM;Both;20 reps      Assessment/Plan    PT Assessment Patient needs continued PT services  PT Diagnosis Difficulty walking;Acute pain   PT Problem List Decreased  strength;Decreased range of motion;Decreased activity tolerance;Decreased mobility;Decreased knowledge of use of DME;Decreased knowledge of precautions;Pain  PT Treatment Interventions DME instruction;Gait training;Stair training;Functional mobility training;Therapeutic activities;Therapeutic exercise;Patient/family education   PT Goals (Current goals can be found in the Care Plan section) Acute Rehab PT Goals Patient Stated Goal: to decr hip pain PT Goal Formulation: With patient Time For Goal Achievement: 01/22/16 Potential to Achieve Goals: Good    Frequency 7X/week   Barriers to discharge Decreased caregiver support Must be modified independent with basic mobilty and ADLs`    Co-evaluation               End of Session   Activity Tolerance: Patient tolerated treatment well (even with significant pain) Patient left: in chair;with call bell/phone within reach Nurse Communication: Mobility status;Patient requests pain meds         Time: [id number] PT Time Calculation (min) (ACUTE ONLY): 27 min   Charges:   PT Evaluation $PT Eval Low Complexity: 1 Procedure PT Treatments $Gait Training: 8-22 mins   PT G Codes:        Roney Marion Hamff 01/15/2016, 10:42 AM  Roney Marion, PT  Acute Rehabilitation Services Pager [phone] Office [phone]

## 2016-01-15 NOTE — Progress Notes (Signed)
Orthopedic Tech Progress Note Patient Details:  Kurt [name] 1966/11/23 [id number]  Ortho Devices Ortho Device/Splint Location: trapeze bar patient helper Ortho Device/Splint Interventions: Application   Hildred Priest 01/15/2016, 7:47 AM

## 2016-01-15 NOTE — Progress Notes (Signed)
Orthopedic Tech Progress Note Patient Details:  Kurt [name] 1966-12-14 [id number]  Patient ID: Kurt Emerald., male   DOB: Jan 24, 1966, 50 y.o.   MRN: [id number] Viewed order from doctor's order list  Hildred Priest 01/15/2016, 7:47 AM

## 2016-01-15 NOTE — Progress Notes (Signed)
Physical Therapy Treatment Patient Details Name: Kurt [name]. 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    PT Comments    Patient continues to progress well. ABle to increase ambulation. Noted decreased weight through R LE without cues given to increase. Able to incorporate more LE therex this session  Follow Up Recommendations  Home health PT;Supervision - Intermittent     Equipment Recommendations  Rolling walker with 5" wheels;3in1 (PT)    Recommendations for Other Services OT consult     Precautions / Restrictions Precautions Precautions: Posterior Hip Precaution Booklet Issued: Yes (comment) Precaution Comments: Educated on Posterior Prec Restrictions Weight Bearing Restrictions: Yes RLE Weight Bearing: Weight bearing as tolerated LLE Weight Bearing: Weight bearing as tolerated    Mobility  Bed Mobility Overal bed mobility: Needs Assistance Bed Mobility: Supine to Sit     Supine to sit: Min guard     General bed mobility comments: Up in chair before and after session  Transfers Overall transfer level: Needs assistance Equipment used: Rolling walker (2 wheeled) Transfers: Sit to/from Stand Sit to Stand: Min guard         General transfer comment: Patient with safe technique. MG for safety  Ambulation/Gait Ambulation/Gait assistance: Min guard Ambulation Distance (Feet): 70 Feet Assistive device: Rolling walker (2 wheeled) Gait Pattern/deviations: Step-through pattern     General Gait Details: Noted decreased weight through R LE. Cues to step through with  weight through his heel and to relax shoulders.    Stairs            Wheelchair Mobility    Modified Rankin (Stroke Patients Only)       Balance Overall balance assessment: No apparent balance deficits (not formally assessed)                                  Cognition Arousal/Alertness: Awake/alert Behavior During Therapy: WFL for tasks assessed/performed Overall Cognitive Status: Within Functional Limits for tasks assessed                      Exercises Total Joint Exercises Ankle Circles/Pumps: AROM;Both;20 reps Quad Sets: AROM;Both;10 reps Gluteal Sets: AROM;Both;10 reps Heel Slides: AAROM;Right;10 reps Hip ABduction/ADduction: AAROM;Right;10 reps Long Arc Quad: AAROM;Right;10 reps    General Comments        Pertinent Vitals/Pain Pain Assessment: 0-10 Pain Score: 7  Pain Location: L hip Pain Descriptors / Indicators: Sore Pain Intervention(s): Monitored during session;Repositioned    Home Living Family/patient expects to be discharged to:: Private residence Living Arrangements: Alone Available Help at Discharge: Family;Friend(s);Available PRN/intermittently Type of Home: House Home Access: Stairs to enter Entrance Stairs-Rails: Right;Left Home Layout: One level Home Equipment: Other (comment) (to be determined)      Prior Function Level of Independence: Independent          PT Goals (current goals can now be found in the care plan section) Acute Rehab PT Goals Patient Stated Goal: to decr hip pain PT Goal Formulation: With patient Time For Goal Achievement: 01/22/16 Potential to Achieve Goals: Good Progress towards PT goals: Progressing toward goals  Frequency  7X/week    PT Plan Current plan remains appropriate    Co-evaluation             End of Session   Activity Tolerance: Patient tolerated treatment well Patient left: in chair;with call bell/phone within reach     Time: 1310-1327 PT Time  Calculation (min) (ACUTE ONLY): 17 min  Charges:  $Gait Training: 8-22 mins                    G Codes:      Jacqualyn Posey 01/15/2016, 1:35 PM  01/15/2016 Robinette, Tonia Brooms PTA

## 2016-01-15 NOTE — Discharge Instructions (Signed)
INSTRUCTIONS AFTER JOINT REPLACEMENT  ° °o Remove items at home which could result in a fall. This includes throw rugs or furniture in walking pathways °o ICE to the affected joint every three hours while awake for 30 minutes at a time, for at least the first 3-5 days, and then as needed for pain and swelling.  Continue to use ice for pain and swelling. You may notice swelling that will progress down to the foot and ankle.  This is normal after surgery.  Elevate your leg when you are not up walking on it.   °o Continue to use the breathing machine you got in the hospital (incentive spirometer) which will help keep your temperature down.  It is common for your temperature to cycle up and down following surgery, especially at night when you are not up moving around and exerting yourself.  The breathing machine keeps your lungs expanded and your temperature down. ° ° °DIET:  As you were doing prior to hospitalization, we recommend a well-balanced diet. ° °DRESSING / WOUND CARE / SHOWERING ° °You may change your dressing 3-5 days after surgery.  Then change the dressing every day with sterile gauze.  Please use good hand washing techniques before changing the dressing.  Do not use any lotions or creams on the incision until instructed by your surgeon. ° °ACTIVITY ° °o Increase activity slowly as tolerated, but follow the weight bearing instructions below.   °o No driving for 6 weeks or until further direction given by your physician.  You cannot drive while taking narcotics.  °o No lifting or carrying greater than 10 lbs. until further directed by your surgeon. °o Avoid periods of inactivity such as sitting longer than an hour when not asleep. This helps prevent blood clots.  °o You may return to work once you are authorized by your doctor.  ° ° ° °WEIGHT BEARING  ° °Weight bearing as tolerated with assist device (walker, cane, etc) as directed, use it as long as suggested by your surgeon or therapist, typically at  least 4-6 weeks. ° ° °EXERCISES ° °Results after joint replacement surgery are often greatly improved when you follow the exercise, range of motion and muscle strengthening exercises prescribed by your doctor. Safety measures are also important to protect the joint from further injury. Any time any of these exercises cause you to have increased pain or swelling, decrease what you are doing until you are comfortable again and then slowly increase them. If you have problems or questions, call your caregiver or physical therapist for advice.  ° °Rehabilitation is important following a joint replacement. After just a few days of immobilization, the muscles of the leg can become weakened and shrink (atrophy).  These exercises are designed to build up the tone and strength of the thigh and leg muscles and to improve motion. Often times heat used for twenty to thirty minutes before working out will loosen up your tissues and help with improving the range of motion but do not use heat for the first two weeks following surgery (sometimes heat can increase post-operative swelling).  ° °These exercises can be done on a training (exercise) mat, on the floor, on a table or on a bed. Use whatever works the best and is most comfortable for you.    Use music or television while you are exercising so that the exercises are a pleasant break in your day. This will make your life better with the exercises acting as a break   in your routine that you can look forward to.   Perform all exercises about fifteen times, three times per day or as directed.  You should exercise both the operative leg and the other leg as well. ° °Exercises include: °  °• Quad Sets - Tighten up the muscle on the front of the thigh (Quad) and hold for 5-10 seconds.   °• Straight Leg Raises - With your knee straight (if you were given a brace, keep it on), lift the leg to 60 degrees, hold for 3 seconds, and slowly lower the leg.  Perform this exercise against  resistance later as your leg gets stronger.  °• Leg Slides: Lying on your back, slowly slide your foot toward your buttocks, bending your knee up off the floor (only go as far as is comfortable). Then slowly slide your foot back down until your leg is flat on the floor again.  °• Angel Wings: Lying on your back spread your legs to the side as far apart as you can without causing discomfort.  °• Hamstring Strength:  Lying on your back, push your heel against the floor with your leg straight by tightening up the muscles of your buttocks.  Repeat, but this time bend your knee to a comfortable angle, and push your heel against the floor.  You may put a pillow under the heel to make it more comfortable if necessary.  ° °A rehabilitation program following joint replacement surgery can speed recovery and prevent re-injury in the future due to weakened muscles. Contact your doctor or a physical therapist for more information on knee rehabilitation.  ° ° °CONSTIPATION ° °Constipation is defined medically as fewer than three stools per week and severe constipation as less than one stool per week.  Even if you have a regular bowel pattern at home, your normal regimen is likely to be disrupted due to multiple reasons following surgery.  Combination of anesthesia, postoperative narcotics, change in appetite and fluid intake all can affect your bowels.  ° °YOU MUST use at least one of the following options; they are listed in order of increasing strength to get the job done.  They are all available over the counter, and you may need to use some, POSSIBLY even all of these options:   ° °Drink plenty of fluids (prune juice may be helpful) and high fiber foods °Colace 100 mg by mouth twice a day  °Senokot for constipation as directed and as needed Dulcolax (bisacodyl), take with full glass of water  °Miralax (polyethylene glycol) once or twice a day as needed. ° °If you have tried all these things and are unable to have a bowel  movement in the first 3-4 days after surgery call either your surgeon or your primary doctor.   ° °If you experience loose stools or diarrhea, hold the medications until you stool forms back up.  If your symptoms do not get better within 1 week or if they get worse, check with your doctor.  If you experience "the worst abdominal pain ever" or develop nausea or vomiting, please contact the office immediately for further recommendations for treatment. ° ° °ITCHING:  If you experience itching with your medications, try taking only a single pain pill, or even half a pain pill at a time.  You can also use Benadryl over the counter for itching or also to help with sleep.  ° °TED HOSE STOCKINGS:  Use stockings on both legs until for at least 2 weeks or as   directed by physician office. They may be removed at night for sleeping. ° °MEDICATIONS:  See your medication summary on the “After Visit Summary” that nursing will review with you.  You may have some home medications which will be placed on hold until you complete the course of blood thinner medication.  It is important for you to complete the blood thinner medication as prescribed. ° °PRECAUTIONS:  If you experience chest pain or shortness of breath - call 911 immediately for transfer to the hospital emergency department.  ° °If you develop a fever greater that 101 F, purulent drainage from wound, increased redness or drainage from wound, foul odor from the wound/dressing, or calf pain - CONTACT YOUR SURGEON.   °                                                °FOLLOW-UP APPOINTMENTS:  If you do not already have a post-op appointment, please call the office for an appointment to be seen by your surgeon.  Guidelines for how soon to be seen are listed in your “After Visit Summary”, but are typically between 1-4 weeks after surgery. ° °OTHER INSTRUCTIONS:  ° °Knee Replacement:  Do not place pillow under knee, focus on keeping the knee straight while resting. CPM  instructions: 0-90 degrees, 2 hours in the morning, 2 hours in the afternoon, and 2 hours in the evening. Place foam block, curve side up under heel at all times except when in CPM or when walking.  DO NOT modify, tear, cut, or change the foam block in any way. ° °MAKE SURE YOU:  °• Understand these instructions.  °• Get help right away if you are not doing well or get worse.  ° ° °Thank you for letting us be a part of your medical care team.  It is a privilege we respect greatly.  We hope these instructions will help you stay on track for a fast and full recovery!  ° °Information on my medicine - XARELTO® (Rivaroxaban) ° °This medication education was reviewed with me or my healthcare representative as part of my discharge preparation. ° °Why was Xarelto® prescribed for you? °Xarelto® was prescribed for you to reduce the risk of blood clots forming after orthopedic surgery. The medical term for these abnormal blood clots is venous thromboembolism (VTE). ° °What do you need to know about xarelto® ? °Take your Xarelto® ONCE DAILY at the same time every day. °You may take it either with or without food. ° °If you have difficulty swallowing the tablet whole, you may crush it and mix in applesauce just prior to taking your dose. ° °Take Xarelto® exactly as prescribed by your doctor and DO NOT stop taking Xarelto® without talking to the doctor who prescribed the medication.  Stopping without other VTE prevention medication to take the place of Xarelto® may increase your risk of developing a clot. ° °After discharge, you should have regular check-up appointments with your healthcare provider that is prescribing your Xarelto®.   ° °What do you do if you miss a dose? °If you miss a dose, take it as soon as you remember on the same day then continue your regularly scheduled once daily regimen the next day. Do not take two doses of Xarelto® on the same day.  ° °Important Safety Information °A possible side effect of Xarelto®    is bleeding. You should call your healthcare provider right away if you experience any of the following: °? Bleeding from an injury or your nose that does not stop. °? Unusual colored urine (red or dark brown) or unusual colored stools (red or black). °? Unusual bruising for unknown reasons. °? A serious fall or if you hit your head (even if there is no bleeding). ° °Some medicines may interact with Xarelto® and might increase your risk of bleeding while on Xarelto®. To help avoid this, consult your healthcare provider or pharmacist prior to using any new prescription or non-prescription medications, including herbals, vitamins, non-steroidal anti-inflammatory drugs (NSAIDs) and supplements. ° °This website has more information on Xarelto®: www.xarelto.com. ° ° ° ° °

## 2016-01-16 LAB — CBC
HCT: 30.3 % — ABNORMAL LOW (ref 39.0–52.0)
Hemoglobin: 10.1 g/dL — ABNORMAL LOW (ref 13.0–17.0)
MCH: 32.9 pg (ref 26.0–34.0)
MCHC: 33.3 g/dL (ref 30.0–36.0)
MCV: 98.7 fL (ref 78.0–100.0)
Platelets: 221 10*3/uL (ref 150–400)
RBC: 3.07 MIL/uL — ABNORMAL LOW (ref 4.22–5.81)
RDW: 12.7 % (ref 11.5–15.5)
WBC: 12.3 10*3/uL — ABNORMAL HIGH (ref 4.0–10.5)

## 2016-01-16 LAB — BASIC METABOLIC PANEL
Anion gap: 6 (ref 5–15)
BUN: 8 mg/dL (ref 6–20)
CHLORIDE: 102 mmol/L (ref 101–111)
CO2: 28 mmol/L (ref 22–32)
CREATININE: 0.61 mg/dL (ref 0.61–1.24)
Calcium: 8.7 mg/dL — ABNORMAL LOW (ref 8.9–10.3)
GFR calc Af Amer: >60 mL/min (ref >60)
GFR calc non Af Amer: >60 mL/min (ref >60)
Glucose, Bld: 118 mg/dL — ABNORMAL HIGH (ref 65–99)
Potassium: 3.8 mmol/L (ref 3.5–5.1)
SODIUM: 136 mmol/L (ref 135–145)

## 2016-01-16 NOTE — Evaluation (Signed)
Occupational Therapy Evaluation AND Discharge  Patient Details Name: Kurt [name]. 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   Clinical Impression   Patient admitted with above. Patient mod I due to pain and weakness in R hip PTA. Patient currently functioning at an overall supervision to mod I level WITH use of AE (reacher, sock aid, LH sponge, LH shoe horn) and DME.  No additional OT needs identified as pt plans to discharge home today and son who will be able to assist prn, D/C from acute OT services and no additional follow-up OT needs at this time. All appropriate education provided to patient. Please re-order OT if needed.    Patient is a very quick learner and was able to self-teach himself how to use equipment from hip kit. Pt reports having to compensate a lot PTA due to pain and weakness. Pt states he will purchase a bag or basket for RW for safety with mobility/ambulation when having to transport things. Pt demonstrated safe and effective transfers. Do not feel pt needs HHOT at this time as son will be able to assist with showers and prn for ADLs and/or IADLs.     Follow Up Recommendations  No OT follow up;Supervision - Intermittent    Equipment Recommendations  Other (comment);3 in 1 bedside comode (AE - reacher, sock aid, LH sponge, LH shoe horn)    Recommendations for Other Services  None at this time    Precautions / Restrictions Precautions Precautions: Posterior Hip Precaution Comments: Educated on Posterior Prec Restrictions Weight Bearing Restrictions: Yes RLE Weight  Bearing: Weight bearing as tolerated LLE Weight Bearing: Weight bearing as tolerated    Mobility Bed Mobility General bed mobility comments: Up in chair before and after session  Transfers Overall transfer level: Needs assistance Equipment used: Rolling walker (2 wheeled) Transfers: Sit to/from Stand Sit to Stand: Supervision;Modified independent (Device/Increase time) General transfer comment: no cues needed, distant supervision for safety    Balance Overall balance assessment: No apparent balance deficits (not formally assessed)    ADL Overall ADL's : Needs assistance/impaired General ADL Comments: Pt overall supervision to mod I with use of AE and DME. Pt is mod I with sit to/from stands and transfers.     Pertinent Vitals/Pain Pain Assessment: Faces Pain Score: 6  Faces Pain Scale: Hurts even more Pain Location: right hip with mobility/movement  Pain Descriptors / Indicators: Sore;Guarding;Grimacing Pain Intervention(s): Limited activity within patient's tolerance;Monitored during session     Hand Dominance Right   Extremity/Trunk Assessment Upper Extremity Assessment Upper Extremity Assessment: Overall WFL for tasks assessed   Lower Extremity Assessment Lower Extremity Assessment: Defer to PT evaluation   Cervical / Trunk Assessment Cervical / Trunk Assessment: Normal   Communication Communication Communication: No difficulties   Cognition Arousal/Alertness: Awake/alert Behavior During Therapy: WFL for tasks assessed/performed Overall Cognitive Status: Within Functional Limits for tasks assessed              Home Living Family/patient expects to be discharged to:: Private residence Living Arrangements: Alone Available Help at Discharge: Family;Friend(s);Available PRN/intermittently Type of Home: House Home Access: Stairs to enter [employer] of Steps: 4 Entrance Stairs-Rails: Right;Left Home Layout: One  level     Bathroom Shower/Tub:  Tub/shower unit;Curtain (also has a walk-in shower that i encouraged him to use)   Bathroom Toilet: Standard   Prior Functioning/Environment Level of Independence: Independent      OT Diagnosis: Generalized weakness   OT Problem List:   N/a, no acute OT needs identified at this time     OT Treatment/Interventions:   N/a, no acute OT needs identified at this time     OT Goals(Current goals can be found in the care plan section) Acute Rehab OT Goals Patient Stated Goal: to decr hip pain OT Goal Formulation: All assessment and education complete, DC therapy  OT Frequency:   N/a, no acute OT needs identified at this time     Barriers to D/C:  None known at this time    End of Session Equipment Utilized During Treatment: Rolling walker Nurse Communication: Other (comment) (recommendation for AE)  Activity Tolerance: Patient tolerated treatment well Patient left: in chair;with call bell/phone within reach   Time: 7639-4320 OT Time Calculation (min): 21 min Charges:  OT General Charges $OT Visit: 1 Procedure OT Evaluation $OT Eval Low Complexity: 1 Procedure  Chrys Racer , MS, OTR/L, CLT Pager: [phone]  01/16/2016, 11:49 AM

## 2016-01-16 NOTE — Progress Notes (Addendum)
Physical Therapy Treatment Patient Details Name: Kurt [name]. 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    PT Comments    Pt continues to be limited by pain with activity. Pt educated for HEP, gait and car transfer for D/C. Pt stated understanding and no further questions.   Follow Up Recommendations  Home health PT;Supervision - Intermittent     Equipment Recommendations  Rolling walker with 5" wheels;3in1 (PT)    Recommendations for Other Services       Precautions / Restrictions Precautions Precautions: Posterior Hip Precaution Comments: Educated on Posterior Prec Restrictions Weight Bearing Restrictions: Yes RLE Weight Bearing: Weight bearing as tolerated LLE Weight Bearing: Weight bearing as tolerated    Mobility  Bed Mobility               General bed mobility comments: Up in chair before and after session  Transfers Overall transfer level: Needs assistance Equipment used: Rolling walker (2 wheeled) Transfers: Sit to/from Stand Sit to Stand: Modified independent (Device/Increase time)         General transfer comment: no cues needed, distant supervision for safety  Ambulation/Gait Ambulation/Gait assistance: Supervision Ambulation Distance (Feet): 150 Feet Assistive device: Rolling walker (2 wheeled) Gait Pattern/deviations: Step-to pattern;Decreased stride length;Decreased dorsiflexion - right;Decreased stance time - right   Gait velocity interpretation: Below normal speed for age/gender General Gait Details: decreased weight bearing on RLE, cues for  posture, increased hip flexion, trunk extension, heel strike, and sequence. cues with turning right for foot out first   Stairs   Wheelchair Mobility    Modified Rankin (Stroke Patients Only)       Balance Overall balance assessment: No apparent balance deficits (not formally assessed)                                  Cognition Arousal/Alertness: Awake/alert Behavior During Therapy: WFL for tasks assessed/performed Overall Cognitive Status: Within Functional Limits for tasks assessed                      Exercises Total Joint Exercises Short Arc QuadSinclair Ship;Right;15 reps;Seated Hip ABduction/ADduction: AROM;Right;Seated;15 reps  Marching in Standing: Right;Standing;15 reps;AAROM    General Comments        Pertinent Vitals/Pain Pain Assessment: Faces Pain Score: 7  Faces Pain Scale: Hurts even more Pain Location: right hip with movement Pain Descriptors / Indicators: Stabbing;Squeezing Pain Intervention(s): Limited activity within patient's tolerance;Monitored during session;Repositioned;Patient requesting pain meds-RN notified    Home Living Family/patient expects to be discharged to:: Private residence Living Arrangements: Alone Available Help at Discharge: Family;Friend(s);Available PRN/intermittently Type of Home: House Home Access: Stairs to enter Entrance Stairs-Rails: Right;Left Home Layout: One level        Prior Function Level of Independence: Independent          PT Goals (current goals can now be found in the care plan section) Acute Rehab PT Goals Patient Stated Goal: to decr hip pain Progress towards PT goals: Progressing toward goals    Frequency       PT Plan Current plan remains appropriate  Co-evaluation             End of Session   Activity Tolerance: Patient tolerated treatment well Patient left: in chair;with call bell/phone within reach;with family/visitor present     Time: [id number] PT Time  Calculation (min) (ACUTE ONLY): 21 min  Charges:  $Gait Training: 8-22 mins $Therapeutic Exercise: 8-22 mins                    G Codes:      Melford Aase 01-25-2016, 2:07 PM Elwyn Reach, [place]

## 2016-01-16 NOTE — Discharge Summary (Signed)
Physician Discharge Summary  Patient ID: Kurt [name]. MRN: [id number] DOB/AGE: 52-09-1966 52 y.o.  Admit date: 01/14/2016 Discharge date: 01/16/2016  Admission Diagnoses:  Avascular necrosis of bone of right hip [facility])  Discharge Diagnoses:  Principal Problem:   Avascular necrosis of bone of right hip ([place]) Active Problems:   Avascular necrosis of hip [facility])   Past Medical History  Diagnosis Date  . Coronary artery disease   . Hypertension   . 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 Thyroid disease     reports that he has been low in the past but since he has stoped ETOH it has been normal  . 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([place])   . History of MI (myocardial infarction) 09/12/2015  . Coronary disease 09/12/2015  . Hyperlipidemia   . Avascular necrosis of bone of right hip ([place]) 01/14/2016    Surgeries: Procedure(s): TOTAL HIP ARTHROPLASTY on 01/14/2016   Consultants (if any):    Discharged Condition: Improved  Hospital Course: Kurt Lara. is an 50 y.o. male who was admitted 01/14/2016 with a diagnosis of Avascular necrosis of bone of right hip ([place]) and went to the operating room on 01/14/2016 and underwent the above named procedures.    He was given perioperative antibiotics:  Anti-infectives    Start     Dose/Rate Route Frequency Ordered Stop   01/14/16 1800  ceFAZolin (ANCEF) IVPB 2 g/50 mL premix     2 g 100 mL/hr over 30 Minutes Intravenous Every 6 hours 01/14/16 1638 01/15/16 0031   01/14/16 0930  ceFAZolin (ANCEF) IVPB 2 g/50 mL premix     2 g 100 mL/hr over 30 Minutes Intravenous To ShortStay Surgical 01/13/16 1400 01/14/16 1050    .  He was given sequential compression devices, early ambulation, and xarelto for DVT prophylaxis.  He benefited maximally from the hospital stay and there were no complications.    Recent vital signs:  Filed Vitals:   01/15/16 2220 01/16/16 0456  BP: 111/62 114/63  Pulse: 66 66  Temp: 98.9 F (37.2 C) 98.7 F (37.1 C)   Resp: 16 16    Recent laboratory studies:  Lab Results  Component Value Date   HGB 10.1* 01/16/2016   HGB 11.4* 01/15/2016   HGB 13.8 01/03/2016   Lab Results  Component Value Date   WBC 12.3* 01/16/2016   PLT 221 01/16/2016   No results found for: INR Lab Results  Component Value Date   NA 136 01/16/2016   K 3.8 01/16/2016   CL 102 01/16/2016   CO2 28 01/16/2016   BUN 8 01/16/2016   CREATININE 0.61 01/16/2016   GLUCOSE 118* 01/16/2016    Discharge Medications:     Medication List    STOP taking these medications        aspirin 500 MG tablet     aspirin 81 MG chewable tablet     HYDROcodone-acetaminophen 5-325 MG tablet  Commonly known as:  NORCO/VICODIN      TAKE these medications        ALPRAZolam 0.25 MG tablet  Commonly known as:  XANAX  Take 1 tablet (0.25 mg total) by mouth 2 (two) times daily as needed for anxiety.     atorvastatin 20 MG tablet  Commonly known as:  LIPITOR  Take 1 tablet (20 mg total) by mouth daily at 6 PM.     baclofen 10 MG tablet  Commonly known as:  LIORESAL  Take 1 tablet (10 mg total) by mouth  3 (three) times daily. As needed for muscle spasm     buPROPion 150 MG 24 hr tablet  Commonly known as:  WELLBUTRIN XL  Take 1 tablet (150 mg total) by mouth daily.     carvedilol 3.125 MG tablet  Commonly known as:  COREG  Take 1 tablet (3.125 mg total) by mouth 2 (two) times daily with a meal.     lisinopril 5 MG tablet  Commonly known as:  PRINIVIL,ZESTRIL  Take 1 tablet (5 mg total) by mouth daily.     multivitamin with minerals Tabs tablet  Take 1 tablet by mouth daily.     ondansetron 4 MG tablet  Commonly known as:  ZOFRAN  Take 1 tablet (4 mg total) by mouth every 8 (eight) hours as needed for nausea or vomiting.     oxyCODONE-acetaminophen 10-325 MG tablet  Commonly known as:  PERCOCET  Take 1-2 tablets by mouth every 6 (six) hours as needed for pain. MAXIMUM TOTAL ACETAMINOPHEN DOSE IS 4000 MG PER DAY      rivaroxaban 10 MG Tabs tablet  Commonly known as:  XARELTO  Take 1 tablet (10 mg total) by mouth daily.     sennosides-docusate sodium 8.6-50 MG tablet  Commonly known as:  SENOKOT-S  Take 2 tablets by mouth daily.        Diagnostic Studies: Dg Pelvis Portable  01/14/2016  CLINICAL DATA:  Postoperative total hip replacement on the right EXAM: PORTABLE PELVIS 1-2 VIEWS COMPARISON:  None. FINDINGS: There is a total hip prosthesis on the right with prosthetic components appearing well-seated on this single frontal view. Soft tissue air is noted on the right with soft tissue edema lateral to the right hip joint. No acute fracture dislocation. 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 IMPRESSION: Total hip prosthesis on the right with prosthetic components appearing well-seated on this single frontal view. Soft tissue air in soft tissue edema on the right are expected postoperative findings. 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 No acute fracture or dislocation. Electronically Signed   By: Lowella Grip III M.D.   On: 01/14/2016 14:46    Disposition: 01-Home or Self Care        Follow-up Information    Follow up with Johnny Bridge, MD. Schedule an appointment as soon as possible for a visit in 2 weeks.   Specialty:  Orthopedic Surgery   Contact information:   [place] [address] [phone]       Follow up with [facility].   Contact information:   [address] [place] [address] [phone]        Signed: Johnny Bridge 01/16/2016, 11:32 AM

## 2016-01-16 NOTE — Progress Notes (Signed)
Patient ID: Kurt Saputo., male   DOB: 06-Feb-1966, 50 y.o.   MRN: [id number]     Subjective:  Patient reports pain as mild to moderate.  Patient is doing better and is up ambulating in the room.  In no acute distress.  Objective:   VITALS:   Filed Vitals:   01/15/16 0845 01/15/16 1333 01/15/16 2220 01/16/16 0456  BP: 112/60 105/60 111/62 114/63  Pulse: 82 76 66 66  Temp: 98.9 F (37.2 C)  98.9 F (37.2 C) 98.7 F (37.1 C)  TempSrc: Oral Oral Oral Oral  Resp: 16 16 16 16   Height:      Weight:      SpO2: 99% 100% 96% 98%    ABD soft Sensation intact distally Dorsiflexion/Plantar flexion intact Incision: dressing C/D/I and no drainage   Lab Results  Component Value Date   WBC 12.3* 01/16/2016   HGB 10.1* 01/16/2016   HCT 30.3* 01/16/2016   MCV 98.7 01/16/2016   PLT 221 01/16/2016   BMET    Component Value Date/Time   NA 136 01/16/2016 0425   K 3.8 01/16/2016 0425   CL 102 01/16/2016 0425   CO2 28 01/16/2016 0425   GLUCOSE 118* 01/16/2016 0425   BUN 8 01/16/2016 0425   CREATININE 0.61 01/16/2016 0425   CALCIUM 8.7* 01/16/2016 0425   GFRNONAA >60 01/16/2016 0425   GFRAA >60 01/16/2016 0425     Assessment/Plan: 2 Days Post-Op   Principal Problem:   Avascular necrosis of bone of right hip (HCC) Active Problems:   Avascular necrosis of hip (HCC)   Advance diet Up with therapy Discharge home with home health WBAT Dry dressing PRN Follow up in 2 weeks with Dr Dennard Schaumann, [facility] 01/16/2016, 10:51 AM  Discussed and agree with above.  [name], MD Cell [phone]

## 2016-01-16 NOTE — Progress Notes (Signed)
IV removed. AVS given. Understanding demonstrated. Belongings packed. Transportation arranged by patient.

## 2016-01-16 NOTE — Progress Notes (Signed)
Physical Therapy Treatment Patient Details Name: Kurt [name]. 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    PT Comments    Pt pleasant and eager to progress mobility to return to independent function. Limited by decreased hip strength and ROM. Encouraged increased activity, weight bearing, HEP and normalization of gait. Will continue to follow to maximize function but safe for D/C from therapy perspective.   Follow Up Recommendations  Home health PT;Supervision - Intermittent     Equipment Recommendations  Rolling walker with 5" wheels;3in1 (PT)    Recommendations for Other Services       Precautions / Restrictions Precautions Precautions: Posterior Hip Restrictions Weight Bearing Restrictions: Yes RLE Weight Bearing: Weight bearing as tolerated LLE Weight Bearing: Weight bearing as tolerated    Mobility  Bed Mobility               General bed mobility comments: Up in chair before and after session  Transfers Overall transfer level: Needs assistance   Transfers: Sit to/from Stand Sit to Stand: Supervision         General transfer comment: cues for foot placement  Ambulation/Gait Ambulation/Gait assistance: Supervision Ambulation Distance (Feet): 300 Feet Assistive device: Rolling walker (2 wheeled) Gait Pattern/deviations: Step-to pattern;Decreased stride length;Decreased dorsiflexion - right;Decreased stance time - right   Gait velocity interpretation: Below normal speed for age/gender General Gait Details: decreased weight bearing on RLE, cues for posture,  increased hip flexion, trunk extension, heel strike, and sequence. cues with turning right for foot out first   Stairs Stairs: Yes Stairs assistance: Supervision Stair Management: Step to pattern;Sideways;With walker;Backwards;One rail Right Number of Stairs: 4 General stair comments: pt performed 4 steps sideways with rail without difficulty. demonstrated 2 steps backward with RW with min assist and cues. pt preferred sideways method  Wheelchair Mobility    Modified Rankin (Stroke Patients Only)       Balance                                    Cognition Arousal/Alertness: Awake/alert Behavior During Therapy: WFL for tasks assessed/performed Overall Cognitive Status: Within Functional Limits for tasks assessed                      Exercises Total Joint Exercises Hip ABduction/ADduction: AROM;Right;10 reps;Seated Long Arc Quad: AROM;Seated;Right;10 reps Marching in Standing: AROM;Right;10 reps;Standing    General Comments        Pertinent Vitals/Pain Pain Score: 6  Pain Location: right hip Pain Intervention(s): Limited activity within patient's tolerance;Monitored during session;Repositioned;Patient requesting pain meds-RN notified    Home Living                      Prior Function            PT Goals (current goals can now be found in the care plan section) Progress towards PT goals: Progressing toward goals    Frequency       PT Plan Current plan remains appropriate    Co-evaluation  End of Session   Activity Tolerance: Patient tolerated treatment well Patient left: in chair;with call bell/phone within reach;with nursing/sitter in room     Time: [id number] PT Time Calculation (min) (ACUTE ONLY): 24 min  Charges:  $Gait Training: 8-22 mins $Therapeutic Exercise: 8-22 mins                    G Codes:      Melford Aase 02/02/2016, 10:48 AM Elwyn Reach, [place]

## 2016-01-31 ENCOUNTER — Other Ambulatory Visit: Payer: Self-pay | Admitting: *Deleted

## 2016-01-31 ENCOUNTER — Telehealth: Payer: Self-pay | Admitting: Primary Care

## 2016-01-31 NOTE — Telephone Encounter (Signed)
Please have Kurt [name] call his orthopedist for refills of the percocet they prescribed.

## 2016-01-31 NOTE — Telephone Encounter (Signed)
Pt left voicemail at Triage requesting refill of Rx, looks like hospital filled Rx on 01/14/16 #50 with 0 refills, pt request to pick up Rx asap

## 2016-01-31 NOTE — Telephone Encounter (Signed)
Called and spoken to patient. He did not mean to call us. He already called his orthopedist's office.

## 2016-02-21 ENCOUNTER — Other Ambulatory Visit: Payer: Self-pay | Admitting: Orthopedic Surgery

## 2016-03-11 NOTE — Pre-Procedure Instructions (Signed)
Kurt [name].  03/11/2016      [facility] [address], [place] - [address] [place] [address] Phone: [phone] Fax: [phone]    Your procedure is scheduled on March 28  Report to [facility] at [phone].M.  Call this number if you have problems the morning of surgery:  [phone]   Remember:  Do not eat food or drink liquids after midnight.  Take these medicines the morning of surgery with A SIP OF WATER alprazolam (Xanax), bupropion (Wellbutrin XL), carvedilol (Coreg), Ondansetron (Zofran), Oxycodone-acetaminophen (Percocet) if needed Stop Xarelto as directed by your Dr.   Stop taking aspirin, Ibuprofen, Advil, Motrin, Aleve, BC's, Goody's, Herbal medications, Fish Oil   Do not wear jewelry, make-up or nail polish.  Do not wear lotions, powders, or perfumes.  You may wear deodorant.  Do not shave 48 hours prior to surgery.  Men may shave face and neck.  Do not bring valuables to the hospital.  [facility] is not responsible for any belongings or valuables.  Contacts, dentures or bridgework may not be worn into surgery.  Leave your suitcase in the car.  After surgery it may be brought to your room.  For patients admitted to the hospital, discharge time will be determined by your treatment team.  Patients discharged the day of surgery will not be allowed to drive home.  Special instructions:  [place] - Preparing for Surgery  Before surgery, you can play an important role.  Because skin is not sterile, your skin needs to be as free of germs as possible.  You can reduce the number of germs on you skin by washing with CHG (chlorahexidine gluconate) soap before surgery.  CHG is an antiseptic cleaner which kills germs and bonds with the skin to continue killing germs even after washing.  Please DO NOT use if you have an allergy to CHG or antibacterial soaps.  If your skin becomes reddened/irritated stop using the  CHG and inform your nurse when you arrive at Short Stay.  Do not shave (including legs and underarms) for at least 48 hours prior to the first CHG shower.  You may shave your face.  Please follow these instructions carefully:   1.  Shower with CHG Soap the night before surgery and the    morning of Surgery.  2.  If you choose to wash your hair, wash your hair first as usual with your    normal shampoo.  3.  After you shampoo, rinse your hair and body thoroughly to remove the  Shampoo.  4.  Use CHG as you would any other liquid soap.  You can apply chg directly  to the skin and wash gently with scrungie or a clean washcloth.  5.  Apply the CHG Soap to your body ONLY FROM THE NECK DOWN.  Do not use on open wounds or open sores.  Avoid contact with your eyes, ears, mouth and genitals (private parts).  Wash genitals (private parts)   with your normal soap.  6.  Wash thoroughly, paying special attention to the area where your surgery  will be performed.  7.  Thoroughly rinse your body with warm water from the neck down.  8.  DO NOT shower/wash with your normal soap after using and rinsing off the CHG Soap.  9.  Pat yourself dry with a clean towel.            10.  Wear  clean pajamas.            11.  Place clean sheets on your bed the night of your first shower and do not  sleep with pets.  Day of Surgery  Do not apply any lotions/deoderants the morning of surgery.  Please wear clean clothes to the hospital/surgery center.     Please read over the following fact sheets that you were given. Pain Booklet, Coughing and Deep Breathing, MRSA Information and Surgical Site Infection Prevention

## 2016-03-12 ENCOUNTER — Encounter (HOSPITAL_COMMUNITY): Payer: Self-pay

## 2016-03-12 ENCOUNTER — Encounter (HOSPITAL_COMMUNITY)
Admission: RE | Admit: 2016-03-12 | Discharge: 2016-03-12 | Disposition: A | Discharge status: Home / Self Care | Payer: Managed Care, Other (non HMO) | Source: Ambulatory Visit | Attending: Orthopedic Surgery | Admitting: Orthopedic Surgery

## 2016-03-12 DIAGNOSIS — M1612 Unilateral primary osteoarthritis, left hip: Secondary | ICD-10-CM | POA: Insufficient documentation

## 2016-03-12 DIAGNOSIS — Z01812 Encounter for preprocedural laboratory examination: Secondary | ICD-10-CM | POA: Diagnosis present

## 2016-03-12 HISTORY — DX: Acute myocardial infarction, unspecified: I21.9

## 2016-03-12 HISTORY — DX: Major depressive disorder, single episode, unspecified: F32.9

## 2016-03-12 HISTORY — DX: Anxiety disorder, unspecified: F41.9

## 2016-03-12 HISTORY — DX: Angina pectoris, unspecified: I20.9

## 2016-03-12 HISTORY — DX: Depression, unspecified: F32.A

## 2016-03-12 LAB — CBC
HCT: 44 % (ref 39.0–52.0)
Hemoglobin: 14.7 g/dL (ref 13.0–17.0)
MCH: 33.1 pg (ref 26.0–34.0)
MCHC: 33.4 g/dL (ref 30.0–36.0)
MCV: 99.1 fL (ref 78.0–100.0)
PLATELETS: 223 10*3/uL (ref 150–400)
RBC: 4.44 MIL/uL (ref 4.22–5.81)
RDW: 13.5 % (ref 11.5–15.5)
WBC: 8.1 10*3/uL (ref 4.0–10.5)

## 2016-03-12 LAB — BASIC METABOLIC PANEL
Anion gap: 10 (ref 5–15)
BUN: 14 mg/dL (ref 6–20)
CALCIUM: 9.4 mg/dL (ref 8.9–10.3)
CHLORIDE: 105 mmol/L (ref 101–111)
CO2: 24 mmol/L (ref 22–32)
CREATININE: 0.92 mg/dL (ref 0.61–1.24)
Glucose, Bld: 90 mg/dL (ref 65–99)
Potassium: 4.2 mmol/L (ref 3.5–5.1)
SODIUM: 139 mmol/L (ref 135–145)

## 2016-03-12 LAB — SURGICAL PCR SCREEN
MRSA, PCR: NEGATIVE
STAPHYLOCOCCUS AUREUS: NEGATIVE

## 2016-03-12 NOTE — Progress Notes (Signed)
PCP is Alma Friendly, NP Cardiologist is Dr Debara Pickett Denies any chest pain. Stress test noted from 2016 Echo noted from 2016 States he had a card cath in the late 1990's See [place]'s note from 01-03-16

## 2016-03-23 MED ORDER — CEFAZOLIN SODIUM-DEXTROSE 2-4 GM/100ML-% IV SOLN
2.0000 g | INTRAVENOUS | Status: AC
  Administered 2016-03-24: 2 g via INTRAVENOUS
  Filled 2016-03-23: qty 100

## 2016-03-23 NOTE — Anesthesia Preprocedure Evaluation (Addendum)
Anesthesia Evaluation  Patient identified by MRN, date of birth, ID band Patient awake    Reviewed: Allergy & Precautions, NPO status , Patient's Chart, lab work & pertinent test results, reviewed documented beta blocker date and time   Airway Mallampati: II  TM Distance: >3 FB Neck ROM: Full    Dental no notable dental hx. (+) Teeth Intact, Dental Advisory Given   Pulmonary former smoker,    Pulmonary exam normal breath sounds clear to auscultation       Cardiovascular hypertension, Pt. on medications and Pt. on home beta blockers + angina + CAD and + Cardiac Stents   Rhythm:Regular Rate:Normal  Echo  - Left ventricle: The cavity size was normal. There was mildconcentric hypertrophy. Systolic function was moderately reduced.The estimated ejection fraction was in the range of 35% to 40%.Diffuse hypokinesis. Severe hypokinesis of the apical myocardium.Doppler parameters are consistent with abnormal left ventricularrelaxation (grade 1 diastolic dysfunction). - Left atrium: The atrium was normal in size. - Right ventricle: Systolic function was normal. - Pulmonary arteries: Systolic pressure was within the normalrange. ETOH Cardiomyopathy, STENTS 2006   Neuro/Psych PSYCHIATRIC DISORDERS Anxiety Depression negative neurological ROS  negative psych ROS   GI/Hepatic negative GI ROS, (+)     substance abuse  alcohol use, Recovering from alcohol abuse   Endo/Other  Hypothyroidism   Renal/GU negative Renal ROS     Musculoskeletal negative musculoskeletal ROS (+)   Abdominal   Peds  Hematology negative hematology ROS (+)   Anesthesia Other Findings   Reproductive/Obstetrics negative OB ROS                           Anesthesia Physical Anesthesia Plan  ASA: III  Anesthesia Plan: General   Post-op Pain Management:    Induction: Intravenous  Airway Management Planned: Oral  ETT  Additional Equipment:   Intra-op Plan:   Post-operative Plan: Extubation in OR  Informed Consent: I have reviewed the patients History and Physical, chart, labs and discussed the procedure including the risks, benefits and alternatives for the proposed anesthesia with the patient or authorized representative who has indicated his/her understanding and acceptance.     Plan Discussed with:   Anesthesia Plan Comments: (Had [place] for other hip)        Anesthesia Quick Evaluation

## 2016-03-24 ENCOUNTER — Inpatient Hospital Stay (HOSPITAL_COMMUNITY): Payer: Managed Care, Other (non HMO)

## 2016-03-24 ENCOUNTER — Inpatient Hospital Stay (HOSPITAL_COMMUNITY): Payer: Managed Care, Other (non HMO) | Admitting: Anesthesiology

## 2016-03-24 ENCOUNTER — Encounter (HOSPITAL_COMMUNITY)
Admission: RE | Disposition: A | Discharge status: Home Health | Payer: Self-pay | Source: Ambulatory Visit | Attending: Orthopedic Surgery

## 2016-03-24 ENCOUNTER — Encounter (HOSPITAL_COMMUNITY): Payer: Self-pay | Admitting: Surgery

## 2016-03-24 ENCOUNTER — Inpatient Hospital Stay (HOSPITAL_COMMUNITY)
Admission: RE | Admit: 2016-03-24 | Discharge: 2016-03-28 | DRG: 470 | Disposition: A | Discharge status: Home Health | Payer: Managed Care, Other (non HMO) | Source: Ambulatory Visit | Attending: Orthopedic Surgery | Admitting: Orthopedic Surgery

## 2016-03-24 DIAGNOSIS — Z96641 Presence of right artificial hip joint: Secondary | ICD-10-CM | POA: Diagnosis present

## 2016-03-24 DIAGNOSIS — Z9861 Coronary angioplasty status: Secondary | ICD-10-CM

## 2016-03-24 DIAGNOSIS — Z87891 Personal history of nicotine dependence: Secondary | ICD-10-CM

## 2016-03-24 DIAGNOSIS — I10 Essential (primary) hypertension: Secondary | ICD-10-CM | POA: Diagnosis present

## 2016-03-24 DIAGNOSIS — F419 Anxiety disorder, unspecified: Secondary | ICD-10-CM | POA: Diagnosis present

## 2016-03-24 DIAGNOSIS — I252 Old myocardial infarction: Secondary | ICD-10-CM | POA: Diagnosis not present

## 2016-03-24 DIAGNOSIS — E785 Hyperlipidemia, unspecified: Secondary | ICD-10-CM | POA: Diagnosis present

## 2016-03-24 DIAGNOSIS — D62 Acute posthemorrhagic anemia: Secondary | ICD-10-CM | POA: Diagnosis not present

## 2016-03-24 DIAGNOSIS — Z7982 Long term (current) use of aspirin: Secondary | ICD-10-CM

## 2016-03-24 DIAGNOSIS — M879 Osteonecrosis, unspecified: Principal | ICD-10-CM | POA: Diagnosis present

## 2016-03-24 DIAGNOSIS — M1610 Unilateral primary osteoarthritis, unspecified hip: Secondary | ICD-10-CM

## 2016-03-24 DIAGNOSIS — E440 Moderate protein-calorie malnutrition: Secondary | ICD-10-CM | POA: Insufficient documentation

## 2016-03-24 DIAGNOSIS — Z955 Presence of coronary angioplasty implant and graft: Secondary | ICD-10-CM

## 2016-03-24 DIAGNOSIS — Z96649 Presence of unspecified artificial hip joint: Secondary | ICD-10-CM

## 2016-03-24 DIAGNOSIS — I2510 Atherosclerotic heart disease of native coronary artery without angina pectoris: Secondary | ICD-10-CM | POA: Diagnosis present

## 2016-03-24 DIAGNOSIS — Z809 Family history of malignant neoplasm, unspecified: Secondary | ICD-10-CM | POA: Diagnosis not present

## 2016-03-24 DIAGNOSIS — F1021 Alcohol dependence, in remission: Secondary | ICD-10-CM | POA: Diagnosis present

## 2016-03-24 DIAGNOSIS — F329 Major depressive disorder, single episode, unspecified: Secondary | ICD-10-CM | POA: Diagnosis present

## 2016-03-24 DIAGNOSIS — M87052 Idiopathic aseptic necrosis of left femur: Secondary | ICD-10-CM

## 2016-03-24 HISTORY — PX: TOTAL HIP ARTHROPLASTY: SHX124

## 2016-03-24 HISTORY — DX: Idiopathic aseptic necrosis of left femur: M87.052

## 2016-03-24 HISTORY — DX: Presence of unspecified artificial hip joint: Z96.649

## 2016-03-24 SURGERY — ARTHROPLASTY, HIP, TOTAL,POSTERIOR APPROACH
Anesthesia: General | Site: Hip | Laterality: Left

## 2016-03-24 MED ORDER — METHOCARBAMOL 500 MG PO TABS
500.0000 mg | ORAL_TABLET | Freq: Four times a day (QID) | ORAL | Status: DC | PRN
  Administered 2016-03-24 – 2016-03-25 (×5): 500 mg via ORAL
  Filled 2016-03-24 (×5): qty 1

## 2016-03-24 MED ORDER — FENTANYL CITRATE (PF) 100 MCG/2ML IJ SOLN
25.0000 ug | INTRAMUSCULAR | Status: DC | PRN
  Administered 2016-03-24 (×2): 50 ug via INTRAVENOUS

## 2016-03-24 MED ORDER — OXYCODONE HCL 5 MG PO TABS
ORAL_TABLET | ORAL | Status: AC
  Filled 2016-03-24: qty 2

## 2016-03-24 MED ORDER — EPHEDRINE SULFATE 50 MG/ML IJ SOLN
INTRAMUSCULAR | Status: DC | PRN
  Administered 2016-03-24: 20 mg via INTRAVENOUS
  Administered 2016-03-24 (×3): 10 mg via INTRAVENOUS

## 2016-03-24 MED ORDER — SODIUM CHLORIDE 0.9 % IJ SOLN
INTRAMUSCULAR | Status: AC
  Filled 2016-03-24: qty 10

## 2016-03-24 MED ORDER — RIVAROXABAN 10 MG PO TABS: 10.0000 mg | ORAL_TABLET | Freq: Every day | ORAL | Status: DC

## 2016-03-24 MED ORDER — LIDOCAINE HCL (CARDIAC) 20 MG/ML IV SOLN
INTRAVENOUS | Status: DC | PRN
  Administered 2016-03-24: 100 mg via INTRAVENOUS

## 2016-03-24 MED ORDER — ACETAMINOPHEN 325 MG PO TABS
650.0000 mg | ORAL_TABLET | Freq: Four times a day (QID) | ORAL | Status: DC | PRN
  Administered 2016-03-24 – 2016-03-27 (×6): 650 mg via ORAL
  Filled 2016-03-24 (×6): qty 2

## 2016-03-24 MED ORDER — OXYCODONE-ACETAMINOPHEN 10-325 MG PO TABS: 1.0000 | ORAL_TABLET | Freq: Four times a day (QID) | ORAL | Status: DC | PRN

## 2016-03-24 MED ORDER — MENTHOL 3 MG MT LOZG: 1.0000 | LOZENGE | OROMUCOSAL | Status: DC | PRN

## 2016-03-24 MED ORDER — LISINOPRIL 5 MG PO TABS
5.0000 mg | ORAL_TABLET | Freq: Every day | ORAL | Status: DC
  Administered 2016-03-25 – 2016-03-28 (×3): 5 mg via ORAL
  Filled 2016-03-24 (×4): qty 1

## 2016-03-24 MED ORDER — CEFAZOLIN SODIUM-DEXTROSE 2-4 GM/100ML-% IV SOLN
2.0000 g | Freq: Four times a day (QID) | INTRAVENOUS | Status: AC
  Administered 2016-03-24 (×2): 2 g via INTRAVENOUS
  Filled 2016-03-24 (×2): qty 100

## 2016-03-24 MED ORDER — OXYCODONE HCL 5 MG PO TABS
5.0000 mg | ORAL_TABLET | ORAL | Status: DC | PRN
  Administered 2016-03-24 – 2016-03-28 (×19): 10 mg via ORAL
  Filled 2016-03-24 (×19): qty 2

## 2016-03-24 MED ORDER — ADULT MULTIVITAMIN W/MINERALS CH
1.0000 | ORAL_TABLET | Freq: Every day | ORAL | Status: DC
  Administered 2016-03-25 – 2016-03-28 (×4): 1 via ORAL
  Filled 2016-03-24 (×4): qty 1

## 2016-03-24 MED ORDER — ONDANSETRON HCL 4 MG/2ML IJ SOLN
4.0000 mg | Freq: Four times a day (QID) | INTRAMUSCULAR | Status: DC | PRN
  Administered 2016-03-26 – 2016-03-27 (×2): 4 mg via INTRAVENOUS
  Filled 2016-03-24 (×3): qty 2

## 2016-03-24 MED ORDER — PHENYLEPHRINE HCL 10 MG/ML IJ SOLN
INTRAMUSCULAR | Status: DC | PRN
  Administered 2016-03-24: 120 ug via INTRAVENOUS
  Administered 2016-03-24 (×2): 80 ug via INTRAVENOUS

## 2016-03-24 MED ORDER — ONDANSETRON HCL 4 MG PO TABS: 4.0000 mg | ORAL_TABLET | Freq: Three times a day (TID) | ORAL | Status: DC | PRN

## 2016-03-24 MED ORDER — MIDAZOLAM HCL 5 MG/5ML IJ SOLN
INTRAMUSCULAR | Status: DC | PRN
  Administered 2016-03-24: 2 mg via INTRAVENOUS

## 2016-03-24 MED ORDER — MEPERIDINE HCL 25 MG/ML IJ SOLN: 6.2500 mg | INTRAMUSCULAR | Status: DC | PRN

## 2016-03-24 MED ORDER — DOCUSATE SODIUM 100 MG PO CAPS
100.0000 mg | ORAL_CAPSULE | Freq: Two times a day (BID) | ORAL | Status: DC
  Administered 2016-03-24 – 2016-03-28 (×8): 100 mg via ORAL
  Filled 2016-03-24 (×9): qty 1

## 2016-03-24 MED ORDER — SODIUM CHLORIDE 0.9 % IR SOLN
Status: DC | PRN
  Administered 2016-03-24: 3000 mL

## 2016-03-24 MED ORDER — BUPROPION HCL ER (XL) 150 MG PO TB24
150.0000 mg | ORAL_TABLET | Freq: Every day | ORAL | Status: DC
  Administered 2016-03-25 – 2016-03-28 (×4): 150 mg via ORAL
  Filled 2016-03-24 (×4): qty 1

## 2016-03-24 MED ORDER — ZOLPIDEM TARTRATE 5 MG PO TABS
5.0000 mg | ORAL_TABLET | Freq: Every evening | ORAL | Status: DC | PRN
  Administered 2016-03-24: 5 mg via ORAL
  Filled 2016-03-24 (×2): qty 1

## 2016-03-24 MED ORDER — ATORVASTATIN CALCIUM 20 MG PO TABS
20.0000 mg | ORAL_TABLET | Freq: Every day | ORAL | Status: DC
  Administered 2016-03-24 – 2016-03-27 (×3): 20 mg via ORAL
  Filled 2016-03-24 (×4): qty 1

## 2016-03-24 MED ORDER — FENTANYL CITRATE (PF) 100 MCG/2ML IJ SOLN
INTRAMUSCULAR | Status: AC
  Administered 2016-03-24: 50 ug via INTRAVENOUS
  Filled 2016-03-24: qty 2

## 2016-03-24 MED ORDER — MIDAZOLAM HCL 2 MG/2ML IJ SOLN
INTRAMUSCULAR | Status: AC
  Filled 2016-03-24: qty 2

## 2016-03-24 MED ORDER — ALUM & MAG HYDROXIDE-SIMETH 200-200-20 MG/5ML PO SUSP
30.0000 mL | ORAL | Status: DC | PRN
  Administered 2016-03-26: 30 mL via ORAL
  Filled 2016-03-24: qty 30

## 2016-03-24 MED ORDER — ONDANSETRON HCL 4 MG/2ML IJ SOLN
4.0000 mg | Freq: Once | INTRAMUSCULAR | Status: AC
  Administered 2016-03-24: 4 mg via INTRAVENOUS

## 2016-03-24 MED ORDER — SUGAMMADEX SODIUM 200 MG/2ML IV SOLN
INTRAVENOUS | Status: AC
  Filled 2016-03-24: qty 2

## 2016-03-24 MED ORDER — METOCLOPRAMIDE HCL 5 MG PO TABS
5.0000 mg | ORAL_TABLET | Freq: Three times a day (TID) | ORAL | Status: DC | PRN
  Administered 2016-03-26: 10 mg via ORAL
  Filled 2016-03-24: qty 2

## 2016-03-24 MED ORDER — EPHEDRINE SULFATE 50 MG/ML IJ SOLN
INTRAMUSCULAR | Status: AC
  Filled 2016-03-24: qty 1

## 2016-03-24 MED ORDER — ALPRAZOLAM 0.25 MG PO TABS
0.2500 mg | ORAL_TABLET | Freq: Two times a day (BID) | ORAL | Status: DC | PRN
  Administered 2016-03-24 – 2016-03-27 (×3): 0.25 mg via ORAL
  Filled 2016-03-24 (×3): qty 1

## 2016-03-24 MED ORDER — ROCURONIUM BROMIDE 100 MG/10ML IV SOLN
INTRAVENOUS | Status: DC | PRN
  Administered 2016-03-24: 10 mg via INTRAVENOUS
  Administered 2016-03-24: 40 mg via INTRAVENOUS

## 2016-03-24 MED ORDER — SUCCINYLCHOLINE CHLORIDE 20 MG/ML IJ SOLN
INTRAMUSCULAR | Status: AC
  Filled 2016-03-24: qty 1

## 2016-03-24 MED ORDER — METHOCARBAMOL 500 MG PO TABS
ORAL_TABLET | ORAL | Status: AC
Start: 2016-03-24 — End: 2016-03-25
  Filled 2016-03-24: qty 1

## 2016-03-24 MED ORDER — BACLOFEN 10 MG PO TABS
10.0000 mg | ORAL_TABLET | Freq: Three times a day (TID) | ORAL | Status: DC
Start: 2016-03-24 — End: 2016-08-12

## 2016-03-24 MED ORDER — ONDANSETRON HCL 4 MG PO TABS
4.0000 mg | ORAL_TABLET | Freq: Four times a day (QID) | ORAL | Status: DC | PRN
  Administered 2016-03-26: 4 mg via ORAL
  Filled 2016-03-24: qty 1

## 2016-03-24 MED ORDER — RIVAROXABAN 10 MG PO TABS
10.0000 mg | ORAL_TABLET | Freq: Every day | ORAL | Status: DC
  Administered 2016-03-25 – 2016-03-27 (×3): 10 mg via ORAL
  Filled 2016-03-24 (×3): qty 1

## 2016-03-24 MED ORDER — ACETAMINOPHEN 650 MG RE SUPP: 650.0000 mg | Freq: Four times a day (QID) | RECTAL | Status: DC | PRN

## 2016-03-24 MED ORDER — ALBUMIN HUMAN 5 % IV SOLN
INTRAVENOUS | Status: DC | PRN
  Administered 2016-03-24: 12:00:00 via INTRAVENOUS

## 2016-03-24 MED ORDER — MAGNESIUM CITRATE PO SOLN
1.0000 | Freq: Once | ORAL | Status: AC | PRN
  Administered 2016-03-26: 1 via ORAL
  Filled 2016-03-24: qty 296

## 2016-03-24 MED ORDER — HYDROMORPHONE HCL 1 MG/ML IJ SOLN: 1.0000 mg | INTRAMUSCULAR | Status: DC | PRN

## 2016-03-24 MED ORDER — PHENOL 1.4 % MT LIQD: 1.0000 | OROMUCOSAL | Status: DC | PRN

## 2016-03-24 MED ORDER — DEXAMETHASONE SODIUM PHOSPHATE 10 MG/ML IJ SOLN
10.0000 mg | Freq: Once | INTRAMUSCULAR | Status: AC
  Administered 2016-03-25: 10 mg via INTRAVENOUS
  Filled 2016-03-24: qty 1

## 2016-03-24 MED ORDER — GLYCOPYRROLATE 0.2 MG/ML IJ SOLN
INTRAMUSCULAR | Status: DC | PRN
  Administered 2016-03-24: 0.2 mg via INTRAVENOUS

## 2016-03-24 MED ORDER — SUGAMMADEX SODIUM 200 MG/2ML IV SOLN
INTRAVENOUS | Status: DC | PRN
  Administered 2016-03-24: 200 mg via INTRAVENOUS

## 2016-03-24 MED ORDER — POLYETHYLENE GLYCOL 3350 17 G PO PACK: 17.0000 g | PACK | Freq: Every day | ORAL | Status: DC | PRN

## 2016-03-24 MED ORDER — LACTATED RINGERS IV SOLN
INTRAVENOUS | Status: DC
  Administered 2016-03-24 (×4): via INTRAVENOUS

## 2016-03-24 MED ORDER — POTASSIUM CHLORIDE IN NACL 20-0.45 MEQ/L-% IV SOLN
INTRAVENOUS | Status: DC
  Administered 2016-03-24 – 2016-03-25 (×2): via INTRAVENOUS
  Filled 2016-03-24 (×8): qty 1000

## 2016-03-24 MED ORDER — GLYCOPYRROLATE 0.2 MG/ML IJ SOLN
INTRAMUSCULAR | Status: AC
  Filled 2016-03-24: qty 1

## 2016-03-24 MED ORDER — BISACODYL 10 MG RE SUPP
10.0000 mg | Freq: Every day | RECTAL | Status: DC | PRN
  Administered 2016-03-27: 10 mg via RECTAL
  Filled 2016-03-24: qty 1

## 2016-03-24 MED ORDER — LIDOCAINE HCL (CARDIAC) 20 MG/ML IV SOLN
INTRAVENOUS | Status: AC
  Filled 2016-03-24: qty 5

## 2016-03-24 MED ORDER — BUPIVACAINE HCL (PF) 0.25 % IJ SOLN
INTRAMUSCULAR | Status: AC
  Filled 2016-03-24: qty 30

## 2016-03-24 MED ORDER — ONDANSETRON HCL 4 MG/2ML IJ SOLN
INTRAMUSCULAR | Status: DC | PRN
  Administered 2016-03-24: 4 mg via INTRAVENOUS

## 2016-03-24 MED ORDER — ONDANSETRON HCL 4 MG/2ML IJ SOLN
INTRAMUSCULAR | Status: AC
  Filled 2016-03-24: qty 2

## 2016-03-24 MED ORDER — METOCLOPRAMIDE HCL 5 MG/ML IJ SOLN
5.0000 mg | Freq: Three times a day (TID) | INTRAMUSCULAR | Status: DC | PRN
  Administered 2016-03-24: 10 mg via INTRAVENOUS
  Filled 2016-03-24: qty 2

## 2016-03-24 MED ORDER — FENTANYL CITRATE (PF) 250 MCG/5ML IJ SOLN
INTRAMUSCULAR | Status: AC
  Filled 2016-03-24: qty 5

## 2016-03-24 MED ORDER — METHOCARBAMOL 1000 MG/10ML IJ SOLN
500.0000 mg | Freq: Four times a day (QID) | INTRAMUSCULAR | Status: DC | PRN
  Filled 2016-03-24: qty 5

## 2016-03-24 MED ORDER — OXYCODONE HCL ER 10 MG PO T12A
10.0000 mg | EXTENDED_RELEASE_TABLET | Freq: Two times a day (BID) | ORAL | Status: DC
  Administered 2016-03-24 – 2016-03-26 (×3): 10 mg via ORAL
  Filled 2016-03-24 (×4): qty 1

## 2016-03-24 MED ORDER — LEVOTHYROXINE SODIUM 75 MCG PO TABS
175.0000 ug | ORAL_TABLET | Freq: Every day | ORAL | Status: DC
  Administered 2016-03-25 – 2016-03-28 (×4): 175 ug via ORAL
  Filled 2016-03-24 (×4): qty 1

## 2016-03-24 MED ORDER — DIPHENHYDRAMINE HCL 12.5 MG/5ML PO ELIX: 12.5000 mg | ORAL_SOLUTION | ORAL | Status: DC | PRN

## 2016-03-24 MED ORDER — PROPOFOL 10 MG/ML IV BOLUS
INTRAVENOUS | Status: DC | PRN
  Administered 2016-03-24: 150 mg via INTRAVENOUS

## 2016-03-24 MED ORDER — CARVEDILOL 3.125 MG PO TABS
3.1250 mg | ORAL_TABLET | Freq: Two times a day (BID) | ORAL | Status: DC
  Administered 2016-03-25 – 2016-03-28 (×5): 3.125 mg via ORAL
  Filled 2016-03-24 (×7): qty 1

## 2016-03-24 MED ORDER — BUPIVACAINE HCL (PF) 0.25 % IJ SOLN
INTRAMUSCULAR | Status: DC | PRN
  Administered 2016-03-24: 10 mL

## 2016-03-24 MED ORDER — FENTANYL CITRATE (PF) 100 MCG/2ML IJ SOLN
INTRAMUSCULAR | Status: DC | PRN
  Administered 2016-03-24 (×2): 100 ug via INTRAVENOUS
  Administered 2016-03-24: 50 ug via INTRAVENOUS

## 2016-03-24 MED ORDER — 0.9 % SODIUM CHLORIDE (POUR BTL) OPTIME
TOPICAL | Status: DC | PRN
  Administered 2016-03-24: 1000 mL

## 2016-03-24 MED ORDER — SENNA 8.6 MG PO TABS
1.0000 | ORAL_TABLET | Freq: Two times a day (BID) | ORAL | Status: DC
  Administered 2016-03-24 – 2016-03-28 (×7): 8.6 mg via ORAL
  Filled 2016-03-24 (×7): qty 1

## 2016-03-24 MED ORDER — ROCURONIUM BROMIDE 50 MG/5ML IV SOLN
INTRAVENOUS | Status: AC
  Filled 2016-03-24: qty 2

## 2016-03-24 MED ORDER — PROPOFOL 10 MG/ML IV BOLUS
INTRAVENOUS | Status: AC
  Filled 2016-03-24: qty 20

## 2016-03-24 SURGICAL SUPPLY — 68 items
BIT DRILL 5/64X5 DISP (BIT) ×2 IMPLANT
BLADE SAW SAG 73X25 THK (BLADE) ×1
BLADE SAW SGTL 73X25 THK (BLADE) ×1 IMPLANT
BRUSH FEMORAL CANAL (MISCELLANEOUS) IMPLANT
CAPT HIP TOTAL 2 ×2 IMPLANT
CLSR STERI-STRIP ANTIMIC 1/2X4 (GAUZE/BANDAGES/DRESSINGS) ×2 IMPLANT
COVER SURGICAL LIGHT HANDLE (MISCELLANEOUS) ×2 IMPLANT
DECANTER SPIKE VIAL GLASS SM (MISCELLANEOUS) ×2 IMPLANT
DRAPE INCISE IOBAN 66X45 STRL (DRAPES) IMPLANT
DRAPE ORTHO SPLIT 77X108 STRL (DRAPES) ×2
DRAPE PROXIMA HALF (DRAPES) ×2 IMPLANT
DRAPE SURG ORHT 6 SPLT 77X108 (DRAPES) ×2 IMPLANT
DRAPE U-SHAPE 47X51 STRL (DRAPES) ×2 IMPLANT
DRSG MEPILEX BORDER 4X12 (GAUZE/BANDAGES/DRESSINGS) IMPLANT
DRSG MEPILEX BORDER 4X8 (GAUZE/BANDAGES/DRESSINGS) ×2 IMPLANT
DURAPREP 26ML APPLICATOR (WOUND CARE) ×2 IMPLANT
ELECT BLADE 4.0 EZ CLEAN MEGAD (MISCELLANEOUS) ×2
ELECT CAUTERY BLADE 6.4 (BLADE) ×2 IMPLANT
ELECT REM PT RETURN 9FT ADLT (ELECTROSURGICAL) ×2
ELECTRODE BLDE 4.0 EZ CLN MEGD (MISCELLANEOUS) ×1 IMPLANT
ELECTRODE REM PT RTRN 9FT ADLT (ELECTROSURGICAL) ×1 IMPLANT
GLOVE BIO SURGEON ST LM GN SZ9 (GLOVE) ×2 IMPLANT
GLOVE BIO SURGEON STRL SZ7 (GLOVE) ×4 IMPLANT
GLOVE BIOGEL PI IND STRL 7.0 (GLOVE) ×2 IMPLANT
GLOVE BIOGEL PI IND STRL 8 (GLOVE) ×1 IMPLANT
GLOVE BIOGEL PI INDICATOR 7.0 (GLOVE) ×2
GLOVE BIOGEL PI INDICATOR 8 (GLOVE) ×1
GLOVE BIOGEL PI ORTHO PRO SZ8 (GLOVE) ×1
GLOVE ORTHO TXT STRL SZ7.5 (GLOVE) ×2 IMPLANT
GLOVE PI ORTHO PRO STRL SZ8 (GLOVE) ×1 IMPLANT
GLOVE SURG ORTHO 8.0 STRL STRW (GLOVE) ×2 IMPLANT
GLOVE SURG SS PI 6.0 STRL IVOR (GLOVE) ×2 IMPLANT
GOWN STRL REUS W/ TWL XL LVL3 (GOWN DISPOSABLE) ×1 IMPLANT
GOWN STRL REUS W/TWL 2XL LVL3 (GOWN DISPOSABLE) ×2 IMPLANT
GOWN STRL REUS W/TWL XL LVL3 (GOWN DISPOSABLE) ×1
HANDPIECE INTERPULSE COAX TIP (DISPOSABLE)
HOOD PEEL AWAY FACE SHEILD DIS (HOOD) ×4 IMPLANT
KIT BASIN OR (CUSTOM PROCEDURE TRAY) ×2 IMPLANT
KIT ROOM TURNOVER OR (KITS) ×2 IMPLANT
MANIFOLD NEPTUNE II (INSTRUMENTS) ×2 IMPLANT
NDL SAFETY ECLIPSE 18X1.5 (NEEDLE) ×1 IMPLANT
NDL SUT .5 MAYO 1.404X.05X (NEEDLE) ×1 IMPLANT
NEEDLE HYPO 18GX1.5 SHARP (NEEDLE) ×1
NEEDLE MAYO TAPER (NEEDLE) ×1
NS IRRIG 1000ML POUR BTL (IV SOLUTION) ×2 IMPLANT
PACK TOTAL JOINT (CUSTOM PROCEDURE TRAY) ×2 IMPLANT
PAD ARMBOARD 7.5X6 YLW CONV (MISCELLANEOUS) ×4 IMPLANT
PILLOW ABDUCTION HIP (SOFTGOODS) ×2 IMPLANT
PRESSURIZER FEMORAL UNIV (MISCELLANEOUS) IMPLANT
RETRIEVER SUT HEWSON (MISCELLANEOUS) ×2 IMPLANT
SET HNDPC FAN SPRY TIP SCT (DISPOSABLE) IMPLANT
SPONGE LAP 4X18 X RAY DECT (DISPOSABLE) IMPLANT
SUCTION FRAZIER HANDLE 10FR (MISCELLANEOUS) ×1
SUCTION TUBE FRAZIER 10FR DISP (MISCELLANEOUS) ×1 IMPLANT
SUT FIBERWIRE #2 38 REV NDL BL (SUTURE) ×8
SUT MNCRL AB 4-0 PS2 18 (SUTURE) ×2 IMPLANT
SUT VIC AB 0 CT1 27 (SUTURE) ×1
SUT VIC AB 0 CT1 27XBRD ANBCTR (SUTURE) ×1 IMPLANT
SUT VIC AB 2-0 CT1 27 (SUTURE) ×1
SUT VIC AB 2-0 CT1 TAPERPNT 27 (SUTURE) ×1 IMPLANT
SUT VIC AB 3-0 SH 8-18 (SUTURE) ×2 IMPLANT
SUTURE FIBERWR#2 38 REV NDL BL (SUTURE) ×4 IMPLANT
SYR CONTROL 10ML LL (SYRINGE) ×2 IMPLANT
TOWEL OR 17X24 6PK STRL BLUE (TOWEL DISPOSABLE) ×2 IMPLANT
TOWEL OR 17X26 10 PK STRL BLUE (TOWEL DISPOSABLE) ×2 IMPLANT
TOWER CARTRIDGE SMART MIX (DISPOSABLE) IMPLANT
TRAY FOLEY CATH 14FR (SET/KITS/TRAYS/PACK) IMPLANT
WATER STERILE IRR 1000ML POUR (IV SOLUTION) ×4 IMPLANT

## 2016-03-24 NOTE — Transfer of Care (Signed)
Immediate Anesthesia Transfer of Care Note  Patient: Kurt [name].  Procedure(s) Performed: Procedure(s): TOTAL HIP ARTHROPLASTY (Left)  Patient Location: PACU  Anesthesia Type:General  Level of Consciousness: awake, alert , oriented and sedated  Airway & Oxygen Therapy: Patient Spontanous Breathing and Patient connected to nasal cannula oxygen  Post-op Assessment: Report given to RN, Post -op Vital signs reviewed and stable and Patient moving all extremities  Post vital signs: Reviewed and stable  Last Vitals:  Filed Vitals:   03/24/16 0851  BP: 152/90  Pulse: 62  Temp: 36.9 C  Resp: 20    Complications: No apparent anesthesia complications

## 2016-03-24 NOTE — Progress Notes (Addendum)
Utilization review completed.  L. J. Annelise Mccoy RN, BSN, CM 

## 2016-03-24 NOTE — Anesthesia Procedure Notes (Signed)
Procedure Name: Intubation Date/Time: 03/24/2016 11:32 AM Performed by: Scheryl Darter Pre-anesthesia Checklist: Patient identified, Emergency Drugs available, Suction available, Patient being monitored and Timeout performed Patient Re-evaluated:Patient Re-evaluated prior to inductionOxygen Delivery Method: Circle system utilized Preoxygenation: Pre-oxygenation with 100% oxygen Intubation Type: IV induction Ventilation: Mask ventilation without difficulty Laryngoscope Size: Miller and 3 Grade View: Grade I Tube type: Oral Tube size: 8.0 mm Number of attempts: 1 Airway Equipment and Method: Stylet Placement Confirmation: ETT inserted through vocal cords under direct vision,  positive ETCO2 and breath sounds checked- equal and bilateral Secured at: 23 cm Tube secured with: Tape Dental Injury: Teeth and Oropharynx as per pre-operative assessment

## 2016-03-24 NOTE — Anesthesia Postprocedure Evaluation (Signed)
Anesthesia Post Note  Patient: Kurt [name].  Procedure(s) Performed: Procedure(s) (LRB): TOTAL HIP ARTHROPLASTY (Left)  Patient location during evaluation: PACU Anesthesia Type: General Level of consciousness: awake and alert Pain management: pain level controlled Vital Signs Assessment: post-procedure vital signs reviewed and stable Respiratory status: spontaneous breathing, nonlabored ventilation, respiratory function stable and patient connected to nasal cannula oxygen Cardiovascular status: blood pressure returned to baseline and stable Postop Assessment: no signs of nausea or vomiting Anesthetic complications: no    Last Vitals:  Filed Vitals:   03/24/16 0851 03/24/16 1345  BP: 152/90   Pulse: 62 73  Temp: 36.9 C 36.5 C  Resp: 20     Last Pain:  Filed Vitals:   03/24/16 1355  PainSc: [place]

## 2016-03-24 NOTE — Op Note (Signed)
03/24/2016  1:15 PM  PATIENT:  Kurt [name].   MRN: [id number]  PRE-OPERATIVE DIAGNOSIS:  Avascular necrosis of bone of left hip (HCC)  POST-OPERATIVE DIAGNOSIS:  Avascular necrosis of bone of left hip (HCC)  PROCEDURE:  Procedure(s): TOTAL HIP ARTHROPLASTY  PREOPERATIVE INDICATIONS:    Kurt [name]. is an 50 y.o. male who has a diagnosis of Avascular necrosis of bone of left hip ([place]) and elected for surgical management after failing conservative treatment.  The risks benefits and alternatives were discussed with the patient including but not limited to the risks of nonoperative treatment, versus surgical intervention including infection, bleeding, nerve injury, periprosthetic fracture, the need for revision surgery, dislocation, leg length discrepancy, blood clots, cardiopulmonary complications, morbidity, mortality, among others, and they were willing to proceed.     OPERATIVE REPORT     SURGEON:  [name], MD    ASSISTANT:  Joya Gaskins, OPA-C  (Present throughout the entire procedure,  necessary for completion of procedure in a timely manner, assisting with retraction, instrumentation, and closure)     ANESTHESIA:  Gen.    COMPLICATIONS:  None.     UNIQUE ASPECTS OF THE CASE:  The head had already started to collapse, and was necrotic. Access to the hip was somewhat challenging, and there was a fairly significant amount of hypertrophic pulvinar capsular tissue. The quality of bone in the acetabulum was actually not that good, and I did not have to put much effort into reaming. I did have excellent press-fit.  COMPONENTS:  [employer] fit femur size 6 standard with a 36 mm + 1.5 head ball and a gription acetabular shell size 52 with an apex hole eliminator and a 10 degree +4 lipped polyethylene liner.    PROCEDURE IN DETAIL:   The patient was met in the holding area and  identified.  The appropriate hip was identified and marked at the operative site.  The  patient was then transported to the OR  and  placed under anesthesia.  At that point, the patient was  placed in the lateral decubitus position with the operative side up and  secured to the operating room table and all bony prominences padded.     The operative lower extremity was prepped from the iliac crest to the distal leg.  Sterile draping was performed.  Time out was performed prior to incision.      A routine posterolateral approach was utilized via sharp dissection  carried down to the subcutaneous tissue.  Gross bleeders were Bovie coagulated.  The iliotibial band was identified and incised along the length of the skin incision.  Self-retaining retractors were  inserted.  With the hip internally rotated, the short external rotators  were identified. The piriformis and capsule was tagged with FiberWire, and the hip capsule released in a T-type fashion.  The femoral neck was exposed, and I resected the femoral neck using the appropriate jig. This was performed at approximately a thumb's breadth above the lesser trochanter.    I then exposed the deep acetabulum, cleared out any tissue including the ligamentum teres.  A wing retractor was placed.  After adequate visualization, I excised the labrum, and then sequentially reamed.  I placed the trial acetabulum, which seated nicely, and then impacted the real cup into place.  Appropriate version and inclination was confirmed clinically matching their bony anatomy, and also with the use of the jig.  A trial polyethylene liner was placed and the wing retractor removed.  I then prepared the proximal femur using the cookie-cutter, the lateralizing reamer, and then sequentially reamed and broached.  A trial broach, neck, and head was utilized, and I reduced the hip and it was found to have excellent stability with functional range of motion. The trial components were then removed, and the real polyethylene liner was placed.  I then impacted the real  femoral prosthesis into place into the appropriate version, slightly anteverted to the normal anatomy, and I impacted the real head ball into place. The hip was then reduced and taken through functional range of motion and found to have excellent stability. Leg lengths were restored.  I then used a 2 mm drill bits to pass the FiberWire suture from the capsule and piriformis through the greater trochanter, and secured this. Excellent posterior capsular repair was achieved. I also closed the T in the capsule.  I then irrigated the hip copiously again with pulse lavage, and repaired the fascia with Vicryl, followed by Vicryl for the subcutaneous tissue, Monocryl for the skin, Steri-Strips and sterile gauze. The wounds were injected. The patient was then awakened and returned to PACU in stable and satisfactory condition. There were no complications.  [name], MD Orthopedic Surgeon [phone]   03/24/2016 1:15 PM

## 2016-03-24 NOTE — H&P (Signed)
PREOPERATIVE H&P  Chief Complaint: left hip pain  HPI: Kurt [name]. is a 50 y.o. male who presents for preoperative history and physical with a diagnosis of AVN left hip. Symptoms are rated as moderate to severe, and have been worsening.  This is significantly impairing activities of daily living.  He has elected for surgical management.   He has failed injections, activity modification, anti-inflammatories, and assistive devices. He had a previous right total hip replacement and is very happy with the results, wants the same thing done on the left side.  Preoperative X-rays demonstrate end stage degenerative changes with osteophyte formation, loss of joint space, subchondral sclerosis.   Past Medical History  Diagnosis Date  . Coronary artery disease   . Hypertension   . 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 Thyroid disease     reports that he has been low in the past but since he has stoped ETOH it has been normal  . 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([place])   . History of MI (myocardial infarction) 09/12/2015  . Coronary disease 09/12/2015  . Hyperlipidemia   . Avascular necrosis of bone of right hip ([place]) 01/14/2016  . Myocardial infarction ([place])   . Anginal pain ([place])   . Depression   . Anxiety    Past Surgical History  Procedure Laterality Date  . Coronary stent placement    . Tonsillectomy      as a child  . Total hip arthroplasty Right 01/14/2016    Procedure: TOTAL HIP ARTHROPLASTY;  Surgeon: [name], MD;  Location: [facility];  Service: Orthopedics;  Laterality: Right;  . Cardiac catheterization      many years ago in 1990's   Social History   Social History  . Marital Status: Legally Separated    Spouse Name: N/A  . Number of Children: N/A  . Years of Education: N/A   Social History Main Topics  . Smoking status: Former Smoker -- 1.50 packs/day for 20 years    Types: Cigarettes    Quit date: 11/25/2015  . Smokeless tobacco: Never Used  . Alcohol Use: 0.0  oz/week    0 Standard drinks or equivalent per week     Comment: 4 months-recovering alcoholic per pt  . Drug Use: No  . Sexual Activity: Not Asked   Other Topics Concern  . None   Social History Narrative   Separated.   Works as a Optician, dispensing.   Enjoys fishing, Proofreader, golfing.      Family History  Problem Relation Age of Onset  . Cancer Mother   . Dementia Father   . Cancer Maternal Grandmother   . Cancer Maternal Grandfather   . Cancer Paternal Grandmother   . Cancer Paternal Grandfather    Allergies  Allergen Reactions  . Peanut-Containing Drug Products Anaphylaxis  . Erythromycin Other (See Comments)    Unknown childhood allergy   Prior to Admission medications   Medication Sig Start Date End Date Taking? Authorizing Provider  aspirin 325 MG EC tablet Take 325 mg by mouth daily.   Yes Historical Provider, MD  atorvastatin (LIPITOR) 20 MG tablet Take 1 tablet (20 mg total) by mouth daily at 6 PM. 12/06/15  Yes Minna Merritts, MD  buPROPion (WELLBUTRIN XL) 150 MG 24 hr tablet Take 1 tablet (150 mg total) by mouth daily. 11/28/15  Yes Pleas Koch, NP  carvedilol (COREG) 3.125 MG tablet Take 1 tablet (3.125 mg total) by mouth 2 (two) times daily with a  meal. Patient taking differently: Take 3.125 mg by mouth daily.  12/06/15  Yes Minna Merritts, MD  lisinopril (PRINIVIL,ZESTRIL) 5 MG tablet Take 1 tablet (5 mg total) by mouth daily. 12/06/15  Yes Minna Merritts, MD  Multiple Vitamin (MULTIVITAMIN WITH MINERALS) TABS tablet Take 1 tablet by mouth daily.   Yes Historical Provider, MD  oxyCODONE-acetaminophen (PERCOCET) 10-325 MG tablet Take 1-2 tablets by mouth every 6 (six) hours as needed for pain. MAXIMUM TOTAL ACETAMINOPHEN DOSE IS 4000 MG PER DAY 01/14/16  Yes [name], MD  ALPRAZolam Duanne Moron) 0.25 MG tablet Take 1 tablet (0.25 mg total) by mouth 2 (two) times daily as needed for anxiety. 11/26/15   Loletha Grayer, MD  baclofen (LIORESAL) 10 MG  tablet Take 1 tablet (10 mg total) by mouth 3 (three) times daily. As needed for muscle spasm 01/14/16   [name], MD  levothyroxine (SYNTHROID, LEVOTHROID) 175 MCG tablet Take 175 mcg by mouth daily before breakfast.     Historical Provider, MD  ondansetron (ZOFRAN) 4 MG tablet Take 1 tablet (4 mg total) by mouth every 8 (eight) hours as needed for nausea or vomiting. 01/14/16   [name], MD     Positive ROS: All other systems have been reviewed and were otherwise negative with the exception of those mentioned in the HPI and as above.  Physical Exam: General: Alert, no acute distress Cardiovascular: No pedal edema Respiratory: No cyanosis, no use of accessory musculature GI: No organomegaly, abdomen is soft and non-tender Skin: No lesions in the area of chief complaint Neurologic: Sensation intact distally Psychiatric: Patient is competent for consent with normal mood and affect Lymphatic: No axillary or cervical lymphadenopathy  MUSCULOSKELETAL: Left hip active motion is 0-95 with 10 of internal and external rotation, positive recreation of pain in the groin with these movements, EHL and FHL are intact.  Assessment: Avascular necrosis of the left hip  Plan: Plan for Procedure(s): TOTAL HIP ARTHROPLASTY  The risks benefits and alternatives were discussed with the patient including but not limited to the risks of nonoperative treatment, versus surgical intervention including infection, bleeding, nerve injury, periprosthetic fracture, the need for revision surgery, dislocation, leg length discrepancy, blood clots, cardiopulmonary complications, morbidity, mortality, among others, and they were willing to proceed.     Kurt Bridge, MD Cell [phone]   03/24/2016 10:15 AM

## 2016-03-24 NOTE — Progress Notes (Signed)
Pt requested that we take his belongings to PACU.

## 2016-03-25 ENCOUNTER — Encounter (HOSPITAL_COMMUNITY): Payer: Self-pay | Admitting: Orthopedic Surgery

## 2016-03-25 LAB — BASIC METABOLIC PANEL
Anion gap: 8 (ref 5–15)
CALCIUM: 8.4 mg/dL — AB (ref 8.9–10.3)
CHLORIDE: 99 mmol/L — AB (ref 101–111)
CO2: 24 mmol/L (ref 22–32)
CREATININE: 0.86 mg/dL (ref 0.61–1.24)
Glucose, Bld: 110 mg/dL — ABNORMAL HIGH (ref 65–99)
Potassium: 3.5 mmol/L (ref 3.5–5.1)
SODIUM: 131 mmol/L — AB (ref 135–145)

## 2016-03-25 LAB — CBC
HCT: 32.2 % — ABNORMAL LOW (ref 39.0–52.0)
Hemoglobin: 10.6 g/dL — ABNORMAL LOW (ref 13.0–17.0)
MCH: 31.8 pg (ref 26.0–34.0)
MCHC: 32.9 g/dL (ref 30.0–36.0)
MCV: 96.7 fL (ref 78.0–100.0)
PLATELETS: 176 10*3/uL (ref 150–400)
RBC: 3.33 MIL/uL — ABNORMAL LOW (ref 4.22–5.81)
RDW: 13.2 % (ref 11.5–15.5)
WBC: 8.3 10*3/uL (ref 4.0–10.5)

## 2016-03-25 NOTE — Progress Notes (Signed)
Physical Therapy Treatment Patient Details Name: Kurt [name]. MRN: [id number] DOB: 13-Dec-1966 Today's Date: 03/25/2016    History of Present Illness Admitted for Lt THA due to avascular necrosis. 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    PT Comments    Patient with significant improvement in mobility during second PT session. Cues provided for safety with transfers. Reviewed precautions, pt recalling 2/3. PT to continue to follow and progress mobility in anticipation of D/C to home when medically released.   Follow Up Recommendations  Home health PT;Supervision - Intermittent     Equipment Recommendations  None recommended by PT    Recommendations for Other Services       Precautions / Restrictions Precautions Precautions: Posterior Hip;Fall Precaution Booklet Issued: Yes (comment) Precaution Comments: HEP and post precautions (reviewed and posted) Restrictions Weight Bearing Restrictions: Yes LLE Weight Bearing: Weight bearing as tolerated    Mobility  Bed Mobility               General bed mobility comments: up in chair upon arrival  Transfers Overall transfer level: Needs assistance Equipment used: Rolling walker (2 wheeled) Transfers: Sit to/from Stand Sit to Stand: Min guard         General transfer comment: cues for safety. Reminder when sitting to turn around completely prior to sitting.   Ambulation/Gait Ambulation/Gait assistance: Min guard Ambulation Distance (Feet): 180 Feet Assistive device: Rolling walker (2 wheeled) Gait Pattern/deviations: Step-through pattern Gait velocity: mild decrease   General Gait Details: Decreased weight shift through LLE.    Stairs             Wheelchair Mobility    Modified Rankin (Stroke Patients Only)       Balance Overall balance assessment: Needs assistance Sitting-balance support: No upper extremity supported Sitting balance-Leahy Scale: Good     Standing balance support: No upper extremity supported Standing balance-Leahy Scale: Fair                      Cognition Arousal/Alertness: Awake/alert Behavior During Therapy: WFL for tasks assessed/performed Overall Cognitive Status: Within Functional Limits for tasks assessed                      Exercises      General Comments        Pertinent Vitals/Pain Pain Assessment: 0-10 Pain Score: 5  Pain Location: lt hip Pain Descriptors / Indicators: Aching;Sharp Pain Intervention(s): Limited activity within patient's tolerance;Monitored during session    Home Living                      Prior Function            PT Goals (current goals can now be found in the care plan section) Acute Rehab PT Goals Patient Stated Goal: go home from the hospital PT Goal Formulation: With patient Time For Goal Achievement: 04/08/16 Potential to Achieve Goals: Good Progress towards PT goals: Progressing toward goals    Frequency  7X/week    PT Plan Current plan remains appropriate    Co-evaluation             End of Session Equipment Utilized During Treatment: Gait belt Activity Tolerance: Patient tolerated treatment well Patient left: in chair;with call bell/phone within  reach     Time: [id number] PT Time Calculation (min) (ACUTE ONLY): 21 min  Charges:  $Gait Training: 8-22 mins                    G Codes:      Cassell Clement, PT, CSCS Pager [phone] Office [phone]  03/25/2016, 3:59 PM

## 2016-03-25 NOTE — Progress Notes (Signed)
Patient ID: Kurt [name]., male   DOB: 05/30/1966, 50 y.o.   MRN: [id number]     Subjective:  Patient reports pain as mild to moderate.  Patient sitting up in bed and in  No acute distress.  Denies any CP or SOB  Objective:   VITALS:   Filed Vitals:   03/24/16 1554 03/24/16 2100 03/25/16 0100 03/25/16 0500  BP: 111/79 104/59 102/54 112/64  Pulse: 71 67 72 100  Temp: 97.7 F (36.5 C) 97.7 F (36.5 C) 97.4 F (36.3 C) 102 F (38.9 C)  TempSrc:  Oral Oral Oral  Resp: 12 14 16 18   Height:      Weight:      SpO2: 100% 98% 98% 96%    ABD soft Sensation intact distally Dorsiflexion/Plantar flexion intact Incision: dressing C/D/I and no drainage Good foot and ankle motion  Lab Results  Component Value Date   WBC 8.3 03/25/2016   HGB 10.6* 03/25/2016   HCT 32.2* 03/25/2016   MCV 96.7 03/25/2016   PLT 176 03/25/2016   BMET    Component Value Date/Time   NA 131* 03/25/2016 0440   K 3.5 03/25/2016 0440   CL 99* 03/25/2016 0440   CO2 24 03/25/2016 0440   GLUCOSE 110* 03/25/2016 0440   BUN <5* 03/25/2016 0440   CREATININE 0.86 03/25/2016 0440   CALCIUM 8.4* 03/25/2016 0440   GFRNONAA >60 03/25/2016 0440   GFRAA >60 03/25/2016 0440     Assessment/Plan: 1 Day Post-Op   Principal Problem:   Avascular necrosis of bone of left hip (HCC) Active Problems:   S/P total hip arthroplasty   Advance diet Up with therapy Plan for discharge tomorrow WBAT Dry dressing PRN   [name] 03/25/2016, 8:09 AM  Discussed and agree with above.   [name], MD Cell [phone]

## 2016-03-25 NOTE — Evaluation (Signed)
Physical Therapy Evaluation Patient Details Name: Kurt [name]. MRN: [id number] DOB: 09/12/1966 Today's Date: 03/25/2016   History of Present Illness  Admitted for Lt THA due to avascular necrosis. 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  Clinical Impression  Pt is s/p THA resulting in the deficits listed below (see PT Problem List).  Pt will benefit from skilled PT to increase their independence and safety with mobility to allow discharge to the venue listed below. Pt reports feeling better when out of bed in chair. Anticipate pt will D/C to home when medically stable. PT to continue to follow and progress mobility as tolerated.       Follow Up Recommendations Home health PT;Supervision - Intermittent    Equipment Recommendations  None recommended by PT    Recommendations for Other Services       Precautions / Restrictions Precautions Precautions: Posterior Hip;Fall Precaution Booklet Issued: Yes (comment) Precaution Comments: HEP and post precautions (reviewed and posted) Restrictions Weight Bearing Restrictions: Yes LLE Weight Bearing: Weight bearing as tolerated      Mobility  Bed Mobility Overal bed mobility: Needs Assistance Bed Mobility: Supine to Sit     Supine to sit: Mod assist     General bed mobility comments: assist provided with LLE and moving hips to edge of bed.   Transfers Overall transfer level: Needs assistance Equipment used: Rolling walker (2 wheeled) Transfers: Sit to/from Stand Sit to Stand: Min assist         General transfer comment: cues for hip precautions  Ambulation/Gait Ambulation/Gait assistance: Min guard Ambulation Distance (Feet): 15 Feet Assistive device: Rolling  walker (2 wheeled) Gait Pattern/deviations: Step-to pattern;Decreased step length - right;Decreased stance time - left Gait velocity: decreased   General Gait Details: decreased weight bearing through LLE. No loss of balance.   Stairs            Wheelchair Mobility    Modified Rankin (Stroke Patients Only)       Balance Overall balance assessment: Needs assistance Sitting-balance support: No upper extremity supported Sitting balance-Leahy Scale: Fair     Standing balance support: Bilateral upper extremity supported Standing balance-Leahy Scale: Poor Standing balance comment: using rw                             Pertinent Vitals/Pain Pain Assessment: Faces Faces Pain Scale: Hurts whole lot (with mobility, none at rest) Pain Location: Lt hip Pain Descriptors / Indicators: Burning Pain Intervention(s): Limited activity within patient's tolerance;Monitored during session    Home Living Family/patient expects to be discharged to:: Private residence Living Arrangements: Alone Available Help at Discharge: Family;Friend(s);Available PRN/intermittently Type of Home: House Home Access: Stairs to enter Entrance Stairs-Rails: Psychiatric nurse of Steps: 1 Home Layout: One level Home Equipment: Walker - 2 wheels;Cane - single point      Prior Function Level of Independence: Independent         Comments: reports recently weaning off cane     Hand Dominance        Extremity/Trunk Assessment               Lower Extremity Assessment: LLE deficits/detail   LLE Deficits / Details: assist needed for moving in bed.      Communication  Communication: No difficulties  Cognition Arousal/Alertness: Awake/alert Behavior During Therapy: WFL for tasks assessed/performed Overall Cognitive Status: Within Functional Limits for tasks assessed                      General Comments      Exercises Total Joint Exercises Ankle  Circles/Pumps: AROM;Both;10 reps Quad Sets: Strengthening;Left;10 reps Gluteal Sets: Both;Strengthening;10 reps      Assessment/Plan    PT Assessment Patient needs continued PT services  PT Diagnosis Difficulty walking   PT Problem List Decreased strength;Decreased range of motion;Decreased activity tolerance;Decreased balance;Decreased mobility;Pain  PT Treatment Interventions DME instruction;Gait training;Stair training;Functional mobility training;Therapeutic activities;Therapeutic exercise;Patient/family education   PT Goals (Current goals can be found in the Care Plan section) Acute Rehab PT Goals Patient Stated Goal: go home from the hospital PT Goal Formulation: With patient Time For Goal Achievement: 04/08/16 Potential to Achieve Goals: Good    Frequency 7X/week   Barriers to discharge        Co-evaluation               End of Session Equipment Utilized During Treatment: Gait belt Activity Tolerance: Patient limited by pain Patient left: in chair;with call bell/phone within reach Nurse Communication: Mobility status;Precautions;Weight bearing status         Time: [id number] PT Time Calculation (min) (ACUTE ONLY): 30 min   Charges:   PT Evaluation $PT Eval Moderate Complexity: 1 Procedure PT Treatments $Gait Training: 8-22 mins   PT G Codes:        Cassell Clement, PT, CSCS Pager [phone] Office [phone]  03/25/2016, 10:56 AM

## 2016-03-26 LAB — BASIC METABOLIC PANEL
ANION GAP: 7 (ref 5–15)
BUN: 7 mg/dL (ref 6–20)
CHLORIDE: 98 mmol/L — AB (ref 101–111)
CO2: 26 mmol/L (ref 22–32)
Calcium: 8.8 mg/dL — ABNORMAL LOW (ref 8.9–10.3)
Creatinine, Ser: 0.86 mg/dL (ref 0.61–1.24)
GFR calc non Af Amer: >60 mL/min (ref >60)
Glucose, Bld: 128 mg/dL — ABNORMAL HIGH (ref 65–99)
Potassium: 3.7 mmol/L (ref 3.5–5.1)
SODIUM: 131 mmol/L — AB (ref 135–145)

## 2016-03-26 LAB — CBC
HEMATOCRIT: 28.1 % — AB (ref 39.0–52.0)
HEMOGLOBIN: 9.4 g/dL — AB (ref 13.0–17.0)
MCH: 32.1 pg (ref 26.0–34.0)
MCHC: 33.5 g/dL (ref 30.0–36.0)
MCV: 95.9 fL (ref 78.0–100.0)
Platelets: 155 10*3/uL (ref 150–400)
RBC: 2.93 MIL/uL — AB (ref 4.22–5.81)
RDW: 13.3 % (ref 11.5–15.5)
WBC: 13.1 10*3/uL — AB (ref 4.0–10.5)

## 2016-03-26 NOTE — Progress Notes (Signed)
Pt started feeling nauseous this morning before lunch, experienced relief with Zofran and Reglan, however he was still  tolerating ambulation to the bathroom & in room well. MD d/c IV dilaudid and scheduled oxycontin & held discharge planned for today. Pt still continuously feeling nauseous throughout the afternoon-no vomiting & still has relief with antiemetics, with poor PO intake due nausea. Around 5 pm, pt stated he "hadn't peed in like 5 hours" (10 am was last documented urine occurrence). He was assisted to standing to attempt urination, but felt lightheaded & was unable to urinate. Manual BP was 86/54 HR 115. Bladder scan showed 150 ccs, pt has no urge to urinate at this time. Pt expressed concern about his lack of progression today. MD on call was notified, Dr. Percell Miller, and received order for 2L NS fluid bolus. Will continue to monitor pt.        Keller, Jerry Caras

## 2016-03-26 NOTE — Discharge Instructions (Signed)
INSTRUCTIONS AFTER JOINT REPLACEMENT  ° °o Remove items at home which could result in a fall. This includes throw rugs or furniture in walking pathways °o ICE to the affected joint every three hours while awake for 30 minutes at a time, for at least the first 3-5 days, and then as needed for pain and swelling.  Continue to use ice for pain and swelling. You may notice swelling that will progress down to the foot and ankle.  This is normal after surgery.  Elevate your leg when you are not up walking on it.   °o Continue to use the breathing machine you got in the hospital (incentive spirometer) which will help keep your temperature down.  It is common for your temperature to cycle up and down following surgery, especially at night when you are not up moving around and exerting yourself.  The breathing machine keeps your lungs expanded and your temperature down. ° ° °DIET:  As you were doing prior to hospitalization, we recommend a well-balanced diet. ° °DRESSING / WOUND CARE / SHOWERING ° °You may change your dressing 3-5 days after surgery.  Then change the dressing every day with sterile gauze.  Please use good hand washing techniques before changing the dressing.  Do not use any lotions or creams on the incision until instructed by your surgeon. ° °ACTIVITY ° °o Increase activity slowly as tolerated, but follow the weight bearing instructions below.   °o No driving for 6 weeks or until further direction given by your physician.  You cannot drive while taking narcotics.  °o No lifting or carrying greater than 10 lbs. until further directed by your surgeon. °o Avoid periods of inactivity such as sitting longer than an hour when not asleep. This helps prevent blood clots.  °o You may return to work once you are authorized by your doctor.  ° ° ° °WEIGHT BEARING  ° °Weight bearing as tolerated with assist device (walker, cane, etc) as directed, use it as long as suggested by your surgeon or therapist, typically at  least 4-6 weeks. ° ° °EXERCISES ° °Results after joint replacement surgery are often greatly improved when you follow the exercise, range of motion and muscle strengthening exercises prescribed by your doctor. Safety measures are also important to protect the joint from further injury. Any time any of these exercises cause you to have increased pain or swelling, decrease what you are doing until you are comfortable again and then slowly increase them. If you have problems or questions, call your caregiver or physical therapist for advice.  ° °Rehabilitation is important following a joint replacement. After just a few days of immobilization, the muscles of the leg can become weakened and shrink (atrophy).  These exercises are designed to build up the tone and strength of the thigh and leg muscles and to improve motion. Often times heat used for twenty to thirty minutes before working out will loosen up your tissues and help with improving the range of motion but do not use heat for the first two weeks following surgery (sometimes heat can increase post-operative swelling).  ° °These exercises can be done on a training (exercise) mat, on the floor, on a table or on a bed. Use whatever works the best and is most comfortable for you.    Use music or television while you are exercising so that the exercises are a pleasant break in your day. This will make your life better with the exercises acting as a break   in your routine that you can look forward to.   Perform all exercises about fifteen times, three times per day or as directed.  You should exercise both the operative leg and the other leg as well. ° °Exercises include: °  °• Quad Sets - Tighten up the muscle on the front of the thigh (Quad) and hold for 5-10 seconds.   °• Straight Leg Raises - With your knee straight (if you were given a brace, keep it on), lift the leg to 60 degrees, hold for 3 seconds, and slowly lower the leg.  Perform this exercise against  resistance later as your leg gets stronger.  °• Leg Slides: Lying on your back, slowly slide your foot toward your buttocks, bending your knee up off the floor (only go as far as is comfortable). Then slowly slide your foot back down until your leg is flat on the floor again.  °• Angel Wings: Lying on your back spread your legs to the side as far apart as you can without causing discomfort.  °• Hamstring Strength:  Lying on your back, push your heel against the floor with your leg straight by tightening up the muscles of your buttocks.  Repeat, but this time bend your knee to a comfortable angle, and push your heel against the floor.  You may put a pillow under the heel to make it more comfortable if necessary.  ° °A rehabilitation program following joint replacement surgery can speed recovery and prevent re-injury in the future due to weakened muscles. Contact your doctor or a physical therapist for more information on knee rehabilitation.  ° ° °CONSTIPATION ° °Constipation is defined medically as fewer than three stools per week and severe constipation as less than one stool per week.  Even if you have a regular bowel pattern at home, your normal regimen is likely to be disrupted due to multiple reasons following surgery.  Combination of anesthesia, postoperative narcotics, change in appetite and fluid intake all can affect your bowels.  ° °YOU MUST use at least one of the following options; they are listed in order of increasing strength to get the job done.  They are all available over the counter, and you may need to use some, POSSIBLY even all of these options:   ° °Drink plenty of fluids (prune juice may be helpful) and high fiber foods °Colace 100 mg by mouth twice a day  °Senokot for constipation as directed and as needed Dulcolax (bisacodyl), take with full glass of water  °Miralax (polyethylene glycol) once or twice a day as needed. ° °If you have tried all these things and are unable to have a bowel  movement in the first 3-4 days after surgery call either your surgeon or your primary doctor.   ° °If you experience loose stools or diarrhea, hold the medications until you stool forms back up.  If your symptoms do not get better within 1 week or if they get worse, check with your doctor.  If you experience "the worst abdominal pain ever" or develop nausea or vomiting, please contact the office immediately for further recommendations for treatment. ° ° °ITCHING:  If you experience itching with your medications, try taking only a single pain pill, or even half a pain pill at a time.  You can also use Benadryl over the counter for itching or also to help with sleep.  ° °TED HOSE STOCKINGS:  Use stockings on both legs until for at least 2 weeks or as   directed by physician office. They may be removed at night for sleeping.  MEDICATIONS:  See your medication summary on the After Visit Summary that nursing will review with you.  You may have some home medications which will be placed on hold until you complete the course of blood thinner medication.  It is important for you to complete the blood thinner medication as prescribed.  PRECAUTIONS:  If you experience chest pain or shortness of breath - call 911 immediately for transfer to the hospital emergency department.   If you develop a fever greater that 101 F, purulent drainage from wound, increased redness or drainage from wound, foul odor from the wound/dressing, or calf pain - CONTACT YOUR SURGEON.                                                   FOLLOW-UP APPOINTMENTS:  If you do not already have a post-op appointment, please call the office for an appointment to be seen by your surgeon.  Guidelines for how soon to be seen are listed in your After Visit Summary, but are typically between 1-4 weeks after surgery.  OTHER INSTRUCTIONS:   MAKE SURE YOU:   Understand these instructions.   Get help right away if you are not doing well or get worse.     Thank you for letting us be a part of your medical care team.  It is a privilege we respect greatly.  We hope these instructions will help you stay on track for a fast and full recovery!    Information on my medicine - XARELTO (Rivaroxaban)  This medication education was reviewed with me or my healthcare representative as part of my discharge preparation.    Why was Xarelto prescribed for you? Xarelto was prescribed for you to reduce the risk of blood clots forming after orthopedic surgery. The medical term for these abnormal blood clots is venous thromboembolism (VTE).  What do you need to know about xarelto ? Take your Xarelto ONCE DAILY at the same time every day. You may take it either with or without food.  If you have difficulty swallowing the tablet whole, you may crush it and mix in applesauce just prior to taking your dose.  Take Xarelto exactly as prescribed by your doctor and DO NOT stop taking Xarelto without talking to the doctor who prescribed the medication.  Stopping without other VTE prevention medication to take the place of Xarelto may increase your risk of developing a clot.  After discharge, you should have regular check-up appointments with your healthcare provider that is prescribing your Xarelto.    What do you do if you miss a dose? If you miss a dose, take it as soon as you remember on the same day then continue your regularly scheduled once daily regimen the next day. Do not take two doses of Xarelto on the same day.   Important Safety Information A possible side effect of Xarelto is bleeding. You should call your healthcare provider right away if you experience any of the following: ? Bleeding from an injury or your nose that does not stop. ? Unusual colored urine (red or dark brown) or unusual colored stools (red or black). ? Unusual bruising for unknown reasons. ? A serious fall or if you hit your head (even if there is no bleeding).  Some  medicines may interact with Xarelto and might increase your risk of bleeding while on Xarelto. To help avoid this, consult your healthcare provider or pharmacist prior to using any new prescription or non-prescription medications, including herbals, vitamins, non-steroidal anti-inflammatory drugs (NSAIDs) and supplements.  This website has more information on Xarelto: [email or web address]

## 2016-03-26 NOTE — Evaluation (Addendum)
Occupational Therapy Evaluation AND Discharge  Patient Details Name: Kurt [name]. 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   Clinical Impression   Patient admitted with above. Patient independent PTA. Patient currently functioning at an overall supervision to mod I level.  No additional OT needs identified, D/C from acute OT services and no additional follow-up OT needs at this time. All appropriate education provided to patient. Please re-order OT if needed.      Follow Up Recommendations  No OT follow up;Supervision - Intermittent    Equipment Recommendations  None recommended by OT    Recommendations for Other Services  None at this time    Precautions / Restrictions Precautions Precautions: Posterior Hip;Fall Precaution Comments: reviewed precautions, with min cueing pt able to verbalize all 3 Restrictions Weight Bearing Restrictions: Yes LLE Weight Bearing: Weight bearing as tolerated     Mobility Bed Mobility General bed mobility comments: In gym with PT upon OT arrival. At end of session, left pt seated EOB. Pt aware to notified nursing staff if he wants/needs to use restroom or get up into recliner.   Transfers Overall transfer level: Needs assistance Equipment used: Rolling walker (2 wheeled) Transfers: Sit to/from Stand Sit to Stand: Modified independent (Device/Increase time) General transfer comment: slow but safe technique    Balance Overall balance assessment: Needs assistance Sitting-balance support: No upper extremity supported;Feet supported Sitting balance-Leahy  Scale: Good     Standing balance support: Bilateral upper extremity supported;During functional activity Standing balance-Leahy Scale: Fair Standing balance comment: using RW    ADL Overall ADL's : Needs assistance/impaired General ADL Comments: Pt overall supervision to mod I at this time using AE prn to increase independence with LB ADLs. Pt with recent R hip surgery and states he feels confident to perform ADLs and functional ambulation/mobility using RW. Pt will have intermittent supervision/assistance from family.  Went over walk-in shower transfer and had pt practice using simulated block in therapy gym. Pt safely performed this transfer with distant supervision.     Vision Vision Assessment?: No apparent visual deficits          Pertinent Vitals/Pain Pain Assessment: 0-10 Pain Score: 8  Pain Location: LLE/hip Pain Descriptors / Indicators: Aching;Sore;Guarding;Grimacing Pain Intervention(s): Limited activity within patient's tolerance;Monitored during session;Repositioned     Hand Dominance Right   Extremity/Trunk Assessment Upper Extremity Assessment Upper Extremity Assessment: Overall WFL for tasks assessed   Lower Extremity Assessment Lower Extremity Assessment: Defer to PT evaluation   Cervical / Trunk Assessment Cervical / Trunk Assessment: Normal   Communication Communication Communication: No difficulties   Cognition Arousal/Alertness: Awake/alert Behavior During Therapy: WFL for tasks assessed/performed Overall Cognitive Status: Within Functional Limits for tasks assessed              Home Living Family/patient expects to be discharged to:: Private residence Living Arrangements: Alone Available Help at Discharge: Family;Friend(s);Available PRN/intermittently Type of Home: House Home Access: Stairs to enter [employer] of Steps: 1 Entrance Stairs-Rails: Right;Left Home Layout: One level     Bathroom Shower/Tub: Medical illustrator: Standard     Home Equipment: Environmental consultant - 2 wheels;Cane - single point;Bedside commode;Adaptive equipment Adaptive Equipment:  Reacher;Sock aid;Long-handled shoe horn;Long-handled sponge    Prior Functioning/Environment Level of Independence: Independent  Comments: reports recently weaning off cane    OT Diagnosis: Generalized weakness;Acute pain   OT Problem List:   N/a, no acute OT needs identified at this time     OT Treatment/Interventions:   N/a, no acute OT needs identified at this time     OT Goals(Current goals can be found in the care plan section) Acute Rehab OT Goals Patient Stated Goal: go home from the hospital OT Goal Formulation: All assessment and education complete, DC therapy  OT Frequency:   N/a, no acute OT needs identified at this time     Barriers to D/C:  None known at this time    End of Session Equipment Utilized During Treatment: Gait belt;Rolling walker Nurse Communication: Patient requests pain meds  Activity Tolerance: Patient tolerated treatment well Patient left: in bed;with call bell/phone within reach (seated EOB)   Time: [id number] OT Time Calculation (min): 16 min Charges:  OT General Charges $OT Visit: 1 Procedure OT Evaluation $OT Eval Moderate Complexity: 1 Procedure  Chrys Racer , MS, OTR/L, CLT Pager: [phone]  03/26/2016, 9:01 AM

## 2016-03-26 NOTE — Progress Notes (Signed)
Physical Therapy Treatment Patient Details Name: Kurt [name]. MRN: [id number] DOB: 12-28-1966 Today's Date: 03/26/2016    History of Present Illness Admitted for Lt THA due to avascular necrosis. 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    PT Comments    Patient with reports of feeling more stiff and sore today but improving with mobility. Able to go up/down stairs with safe technique. Pt states that his brother will be with him when going into his home. At the end of the session the patient reported that he was feeling confident about going home. He declined reviewing his HEP but handout has been provided and he will be receiving HHPT. Pt left in care of OT following the session.   Follow Up Recommendations  Home health PT;Supervision - Intermittent     Equipment Recommendations  None recommended by PT    Recommendations for Other Services       Precautions / Restrictions Precautions Precautions: Posterior Hip;Fall Precaution Comments: reviewed precautions Restrictions Weight Bearing Restrictions: Yes LLE Weight Bearing: Weight bearing as tolerated    Mobility  Bed Mobility               General bed mobility comments: up in chair upon arrival  Transfers Overall transfer level: Needs assistance Equipment used: Rolling walker (2 wheeled) Transfers: Sit to/from Stand Sit to Stand: Modified independent (Device/Increase time)         General transfer comment: slow but safe technique  Ambulation/Gait Ambulation/Gait assistance: Modified independent (Device/Increase time) Ambulation Distance (Feet): 20 Feet Assistive device: Rolling walker (2 wheeled) Gait Pattern/deviations: Step-through pattern;Decreased  stance time - left Gait velocity: decreased   General Gait Details: Pt moving slowly and having a tendency for trunk flexion but correcting independently.    Stairs Stairs: Yes Stairs assistance: Min guard Stair Management: One rail Left;Step to pattern Number of Stairs: 7 General stair comments: Working with pt on safe technique for going up/down stairs. Reviewed also how to keep rw with him on steps safely.   Wheelchair Mobility    Modified Rankin (Stroke Patients Only)       Balance Overall balance assessment: Needs assistance Sitting-balance support: No upper extremity supported Sitting balance-Leahy Scale: Good     Standing balance support: No upper extremity supported Standing balance-Leahy Scale: Fair                      Cognition Arousal/Alertness: Awake/alert Behavior During Therapy: WFL for tasks assessed/performed Overall Cognitive Status: Within Functional Limits for tasks assessed                      Exercises      General Comments General comments (skin integrity, edema, etc.): Pt more guarded with mobility today with c/o pain.       Pertinent Vitals/Pain Pain Assessment: 0-10 Pain Score: 7  Pain Location: Lt hip Pain Descriptors / Indicators: Aching Pain Intervention(s): Limited activity within patient's tolerance;Monitored during session    Home Living                      Prior Function            PT Goals (current goals can now be found in the care plan section) Acute Rehab PT Goals Patient Stated  Goal: go home from the hospital PT Goal Formulation: With patient Time For Goal Achievement: 04/08/16 Potential to Achieve Goals: Good Progress towards PT goals: Progressing toward goals    Frequency  7X/week    PT Plan Current plan remains appropriate    Co-evaluation             End of Session Equipment Utilized During Treatment: Gait belt Activity Tolerance: Patient tolerated treatment  well Patient left: Other (comment) (left in gym with OT)     Time: [id number] PT Time Calculation (min) (ACUTE ONLY): 18 min  Charges:  $Gait Training: 8-22 mins                    G Codes:      Cassell Clement, PT, CSCS Pager [phone] Office [phone]  03/26/2016, 8:44 AM

## 2016-03-26 NOTE — Progress Notes (Signed)
Second liter of normal saline bolus started. Nursing will continue to monitor.

## 2016-03-27 ENCOUNTER — Encounter (HOSPITAL_COMMUNITY): Payer: Self-pay | Admitting: General Practice

## 2016-03-27 DIAGNOSIS — D62 Acute posthemorrhagic anemia: Secondary | ICD-10-CM

## 2016-03-27 HISTORY — DX: Acute posthemorrhagic anemia: D62

## 2016-03-27 LAB — PREPARE RBC (CROSSMATCH)

## 2016-03-27 LAB — CBC
HCT: 19.2 % — ABNORMAL LOW (ref 39.0–52.0)
HEMATOCRIT: 25 % — AB (ref 39.0–52.0)
HEMOGLOBIN: 8.9 g/dL — AB (ref 13.0–17.0)
Hemoglobin: 6.9 g/dL — CL (ref 13.0–17.0)
MCH: 34.3 pg — ABNORMAL HIGH (ref 26.0–34.0)
MCH: 31.9 pg (ref 26.0–34.0)
MCHC: 35.9 g/dL (ref 30.0–36.0)
MCHC: 35.6 g/dL (ref 30.0–36.0)
MCV: 95.5 fL (ref 78.0–100.0)
MCV: 89.6 fL (ref 78.0–100.0)
PLATELETS: 147 10*3/uL — AB (ref 150–400)
Platelets: 160 10*3/uL (ref 150–400)
RBC: 2.01 MIL/uL — AB (ref 4.22–5.81)
RBC: 2.79 MIL/uL — AB (ref 4.22–5.81)
RDW: 13.2 % (ref 11.5–15.5)
RDW: 18.5 % — AB (ref 11.5–15.5)
WBC: 9.3 10*3/uL (ref 4.0–10.5)
WBC: 9 10*3/uL (ref 4.0–10.5)

## 2016-03-27 LAB — ABO/RH: ABO/RH(D): A POS

## 2016-03-27 MED ORDER — ENSURE ENLIVE PO LIQD
237.0000 mL | Freq: Two times a day (BID) | ORAL | Status: DC
  Administered 2016-03-28: 237 mL via ORAL

## 2016-03-27 MED ORDER — SODIUM CHLORIDE 0.9 % IV BOLUS (SEPSIS)
2000.0000 mL | Freq: Once | INTRAVENOUS | Status: AC
  Administered 2016-03-26: 2000 mL via INTRAVENOUS

## 2016-03-27 MED ORDER — SODIUM CHLORIDE 0.9 % IV BOLUS (SEPSIS): 2000.0000 mL | Freq: Once | INTRAVENOUS | Status: DC

## 2016-03-27 MED ORDER — SODIUM CHLORIDE 0.9 % IV SOLN
Freq: Once | INTRAVENOUS | Status: AC
  Administered 2016-03-27: 14:00:00 via INTRAVENOUS

## 2016-03-27 NOTE — Progress Notes (Signed)
PT Cancellation Note  Patient Details Name: Kurt [name]. MRN: [id number] DOB: June 08, 1966   Cancelled Treatment:    Reason Eval/Treat Not Completed: Medical issues which prohibited therapyHemoglobin 6.7 and symptomatic. Receiving blood transfusion this pm. PT will check on pt later as time allows.    [place] April, PTA Pager: [phone]   03/27/2016, 12:10 PM

## 2016-03-27 NOTE — Progress Notes (Signed)
PT Cancellation Note  Patient Details Name: Kurt [name]. MRN: [id number] DOB: 03/28/1966   Cancelled Treatment:    Reason Eval/Treat Not Completed: Patient declined, no reason specified Pt receiving blood transfusion. Pt reported feeling too weak to attempt therapy right now. PT will check on pt later as time allows.    Salina April, PTA Pager: [phone]   03/27/2016, 2:29 PM

## 2016-03-27 NOTE — Progress Notes (Signed)
Initial Nutrition Assessment  DOCUMENTATION CODES:   Non-severe (moderate) malnutrition in context of chronic illness  INTERVENTION:  Continue Ensure Enlive po BID, each supplement provides 350 kcal and 20 grams of protein.  Encourage adequate PO intake.   NUTRITION DIAGNOSIS:   Malnutrition related to chronic illness as evidenced by moderate depletions of muscle mass, moderate depletion of body fat.  GOAL:   Patient will meet greater than or equal to 90% of their needs  MONITOR:   PO intake, Supplement acceptance, Weight trends, Labs, I & O's  REASON FOR ASSESSMENT:   Malnutrition Screening Tool    ASSESSMENT:   50 y.o. male who presents for preoperative history and physical with a diagnosis of AVN left hip. Preoperative X-rays demonstrate end stage degenerative changes with osteophyte formation, loss of joint space, subchondral sclerosis.  PROCEDURE: (3/28): TOTAL HIP ARTHROPLASTY (Left)  Pt reports having a decreased appetite since admission. Meal completion at lunch today was 25%. Pt reports abdominal discomfort, thus unable to eat much at meals. Prior meal completions have been 100%. Pt reports usual body weight of ~180 lbs last weighing 1 year ago. Pt reports gradual weight loss due to current ongoing illness. Pt with a 18% weight loss in 1 year (not found significant for time frame). Pt reports he has been consuming on usual 3 meals a day PTA with Ensure shake only on occasion. Pt currently has Ensure ordered BID. RD to continue with orders. Pt educated on continuation of nutritional supplementation to aid in caloric and protein needs at home especially if po intake is poor.   Nutrition-Focused physical exam completed. Findings are moderate fat depletion, moderate muscle depletion, and mild edema.   Labs and medications reviewed.   Diet Order:  Diet regular Room service appropriate?: Yes; Fluid consistency:: Thin  Skin:   (Incisions on hip)  Last BM:   3/28  Height:   Ht Readings from Last 1 Encounters:  03/24/16 6' (1.829 m)    Weight:   Wt Readings from Last 1 Encounters:  03/24/16 147 lb 8 oz (66.906 kg)    Ideal Body Weight:  80.9 kg  BMI:  Body mass index is 20 kg/(m^2).  Estimated Nutritional Needs:   Kcal:  2000-2200  Protein:  90-105 grams  Fluid:  2-2.2 L/day  EDUCATION NEEDS:   Education needs addressed  Corrin Parker, MS, RD, LDN Pager # [phone] After hours/ weekend pager # [phone]

## 2016-03-27 NOTE — Progress Notes (Signed)
CRITICAL VALUE ALERT  Critical value received:  Hemoglobin 6.9  Date of notification:  3/31  Time of notification:  [id number]  Critical value read back:Yes.    Nurse who received alert:  Ethelda Chick  MD notified (1st page):  [place]

## 2016-03-27 NOTE — Progress Notes (Signed)
Patient ID: Kurt [name]., male   DOB: [birth year], 50 y.o.   MRN: [id number]     Subjective:  Patient reports pain as mild to moderate.  Patient sitting up in the chair and some what light headed.  Denies any CP or SOB.  Nausea improved.    Objective:   VITALS:   Filed Vitals:   03/26/16 1746 03/26/16 1759 03/26/16 2151 03/27/16 0515  BP: 86/54  100/54 108/63  Pulse:  100 98 101  Temp:  99.4 F (37.4 C) 100 F (37.8 C) 99.7 F (37.6 C)  TempSrc:  Oral Oral Oral  Resp:   18 18  Height:      Weight:      SpO2:  99% 100% 96%    ABD soft Sensation intact distally Dorsiflexion/Plantar flexion intact Incision: dressing C/D/I and no drainage Hematoma left thigh  Lab Results  Component Value Date   WBC 9.3 03/27/2016   HGB 6.9* 03/27/2016   HCT 19.2* 03/27/2016   MCV 95.5 03/27/2016   PLT 147* 03/27/2016   BMET    Component Value Date/Time   NA 131* 03/26/2016 0357   K 3.7 03/26/2016 0357   CL 98* 03/26/2016 0357   CO2 26 03/26/2016 0357   GLUCOSE 128* 03/26/2016 0357   BUN 7 03/26/2016 0357   CREATININE 0.86 03/26/2016 0357   CALCIUM 8.8* 03/26/2016 0357   GFRNONAA >60 03/26/2016 0357   GFRAA >60 03/26/2016 0357     Assessment/Plan: 3 Days Post-Op   Principal Problem:   Avascular necrosis of bone of left hip (HCC) Active Problems:   S/P total hip arthroplasty   Advance diet Up with therapy Plan for 2 unites of PRBC WBAT Dry dressing PRN Patient aware that he will be getting Blood Will plan to hold xerelto for the next three days  [name] 03/27/2016, 10:31 AM  ABLA likely because he has had two hip surgeries in the past month or so.   Plan to give blood, possible dc home tomorrow if improved.    [name], MD Cell [phone]

## 2016-03-28 DIAGNOSIS — E440 Moderate protein-calorie malnutrition: Secondary | ICD-10-CM | POA: Insufficient documentation

## 2016-03-28 HISTORY — DX: Moderate protein-calorie malnutrition: E44.0

## 2016-03-28 LAB — TYPE AND SCREEN
ABO/RH(D): A POS
ANTIBODY SCREEN: NEGATIVE
Unit division: 0
Unit division: 0

## 2016-03-28 NOTE — Discharge Summary (Signed)
Patient ID: Kurt [name]. MRN: [id number] DOB/AGE: August 27, 1966 50 y.o.  Admit date: 03/24/2016 Discharge date: 03/28/2016  Admission Diagnoses:  Principal Problem:   Avascular necrosis of bone of left hip (HCC) Active Problems:   S/P total hip arthroplasty   Postoperative anemia due to acute blood loss   Discharge Diagnoses:  Same  Past Medical History  Diagnosis Date  . Coronary artery disease   . Hypertension   . 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 Thyroid disease     reports that he has been low in the past but since he has stoped ETOH it has been normal  . 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([place])   . History of MI (myocardial infarction) 09/12/2015  . Coronary disease 09/12/2015  . Hyperlipidemia   . Avascular necrosis of bone of right hip ([place]) 01/14/2016  . Myocardial infarction ([place])   . Anginal pain ([place])   . Depression   . Anxiety   . Avascular necrosis of bone of left hip ([place]) 03/24/2016    Surgeries: Procedure(s): TOTAL HIP ARTHROPLASTY on 03/24/2016   Consultants:    Discharged Condition: Improved  Hospital Course: Kurt [name]. is an 50 y.o. male who was admitted 03/24/2016 for operative treatment ofAvascular necrosis of bone of left hip ([place]). Patient has severe unremitting pain that affects sleep, daily activities, and work/hobbies. After pre-op clearance the patient was taken to the operating room on 03/24/2016 and underwent  Procedure(s): TOTAL HIP ARTHROPLASTY.  Patient developed ABLA in the initial po period with a Hb of 6.9.  He was transfused with 2 units of prbc on pod#2 which brought Hb to 8.9.  He is currently stable but we will continue to follow.  Patient was given perioperative antibiotics: Anti-infectives    Start     Dose/Rate Route Frequency Ordered Stop   03/24/16 1730  ceFAZolin (ANCEF) IVPB 2g/100 mL premix     2 g 200 mL/hr over 30 Minutes Intravenous Every 6 hours 03/24/16 1539 03/25/16 0002   03/24/16 1000  ceFAZolin (ANCEF) IVPB  2g/100 mL premix     2 g 200 mL/hr over 30 Minutes Intravenous To ShortStay Surgical 03/23/16 1437 03/24/16 1136       Patient was given sequential compression devices, early ambulation, and chemoprophylaxis to prevent DVT.  Patient benefited maximally from hospital stay and there were no complications.    Recent vital signs: Patient Vitals for the past 24 hrs:  BP Temp Temp src Pulse Resp SpO2  03/28/16 0752 128/72 mmHg - - - - -  03/28/16 0525 128/72 mmHg 99 F (37.2 C) Oral 78 16 97 %  03/27/16 2039 106/62 mmHg 99 F (37.2 C) Oral 82 16 100 %  03/27/16 1944 104/67 mmHg 99 F (37.2 C) Oral 87 16 100 %  03/27/16 1711 123/73 mmHg 99.7 F (37.6 C) Oral 92 18 100 %  03/27/16 1651 125/80 mmHg (!) 100.4 F (38 C) Oral 93 14 100 %  03/27/16 1604 131/78 mmHg 99.3 F (37.4 C) Oral 87 16 98 %  03/27/16 1359 117/72 mmHg 99.7 F (37.6 C) Oral 99 16 98 %  03/27/16 1330 108/68 mmHg 99.3 F (37.4 C) Oral (!) 102 16 97 %  03/27/16 1230 111/69 mmHg 98.5 F (36.9 C) Oral 83 18 97 %     Recent laboratory studies:  Recent Labs  03/26/16 0357 03/27/16 0629 03/27/16 2202  WBC 13.1* 9.3 9.0  HGB 9.4* 6.9* 8.9*  HCT 28.1* 19.2* 25.0*  PLT 155 147* 160  NA 131*  --   --   K 3.7  --   --   CL 98*  --   --   CO2 26  --   --   BUN 7  --   --   CREATININE 0.86  --   --   GLUCOSE 128*  --   --   CALCIUM 8.8*  --   --      Discharge Medications:     Medication List    STOP taking these medications        aspirin 325 MG EC tablet      TAKE these medications        ALPRAZolam 0.25 MG tablet  Commonly known as:  XANAX  Take 1 tablet (0.25 mg total) by mouth 2 (two) times daily as needed for anxiety.     atorvastatin 20 MG tablet  Commonly known as:  LIPITOR  Take 1 tablet (20 mg total) by mouth daily at 6 PM.     baclofen 10 MG tablet  Commonly known as:  LIORESAL  Take 1 tablet (10 mg total) by mouth 3 (three) times daily. As needed for muscle spasm     buPROPion 150 MG  24 hr tablet  Commonly known as:  WELLBUTRIN XL  Take 1 tablet (150 mg total) by mouth daily.     carvedilol 3.125 MG tablet  Commonly known as:  COREG  Take 1 tablet (3.125 mg total) by mouth 2 (two) times daily with a meal.     levothyroxine 175 MCG tablet  Commonly known as:  SYNTHROID, LEVOTHROID  Take 175 mcg by mouth daily before breakfast.     lisinopril 5 MG tablet  Commonly known as:  PRINIVIL,ZESTRIL  Take 1 tablet (5 mg total) by mouth daily.     multivitamin with minerals Tabs tablet  Take 1 tablet by mouth daily.     ondansetron 4 MG tablet  Commonly known as:  ZOFRAN  Take 1 tablet (4 mg total) by mouth every 8 (eight) hours as needed for nausea or vomiting.     oxyCODONE-acetaminophen 10-325 MG tablet  Commonly known as:  PERCOCET  Take 1-2 tablets by mouth every 6 (six) hours as needed for pain. MAXIMUM TOTAL ACETAMINOPHEN DOSE IS 4000 MG PER DAY     rivaroxaban 10 MG Tabs tablet  Commonly known as:  XARELTO  Take 1 tablet (10 mg total) by mouth daily.        Diagnostic Studies: Dg Hip Port Unilat With Pelvis 1v Left  03/24/2016  CLINICAL DATA:  Status post left hip surgery. EXAM: DG HIP (WITH OR WITHOUT PELVIS) 1V PORT LEFT COMPARISON:  September 12, 2015 FINDINGS: Bilateral total hip replacements are identified without malalignment. IMPRESSION: Bilateral total hip replacements without malalignment. Electronically Signed   By: Abelardo Diesel M.D.   On: 03/24/2016 16:07    Disposition: 06-Home-Health Care Svc        Follow-up Information    Follow up with Johnny Bridge, MD. Schedule an appointment as soon as possible for a visit in 2 weeks.   Specialty:  Orthopedic Surgery   Contact information:   [address] [place] [address] [phone]       AGREE WITH ABOVE.   Signed: Fannie Knee 03/28/2016, 9:14 AM

## 2016-03-28 NOTE — Progress Notes (Signed)
Subjective: 4 Days Post-Op Procedure(s) (LRB): TOTAL HIP ARTHROPLASTY (Left) Patient reports pain as moderate.  No nausea/vomiting, lightheadedness/dizziness, chest pain/sob.  Tolerating diet.  Objective: Vital signs in last 24 hours: Temp:  [98.5 F (36.9 C)-100.4 F (38 C)] 99 F (37.2 C) (04/01 0525) Pulse Rate:  [78-102] 78 (04/01 0525) Resp:  [14-18] 16 (04/01 0525) BP: (104-131)/(62-80) 128/72 mmHg (04/01 0752) SpO2:  [97 %-100 %] 97 % (04/01 0525)  Intake/Output from previous day: 03/31 0701 - 04/01 0700 In: 1505 [P.O.:840; Blood:665] Out: 900 [Urine:900] Intake/Output this shift:     Recent Labs  03/26/16 0357 03/27/16 0629 03/27/16 2202  HGB 9.4* 6.9* 8.9*    Recent Labs  03/27/16 0629 03/27/16 2202  WBC 9.3 9.0  RBC 2.01* 2.79*  HCT 19.2* 25.0*  PLT 147* 160    Recent Labs  03/26/16 0357  NA 131*  K 3.7  CL 98*  CO2 26  BUN 7  CREATININE 0.86  GLUCOSE 128*  CALCIUM 8.8*   No results for input(s): LABPT, INR in the last 72 hours.  Neurologically intact Neurovascular intact Sensation intact distally Intact pulses distally Dorsiflexion/Plantar flexion intact Incision: dressing C/D/I No cellulitis present Compartment soft  Assessment/Plan: 4 Days Post-Op Procedure(s) (LRB): TOTAL HIP ARTHROPLASTY (Left) Advance diet Up with therapy Discharge home with home health today WBAT LLE-posterior hip precautions ABLA- improved and feeling much better after receiving 2 units prbc yesterday Dry dressing change prn  Fannie Knee 03/28/2016, 9:45 AM

## 2016-03-28 NOTE — Progress Notes (Signed)
Physical Therapy Treatment Patient Details Name: Kurt [name]. MRN: [id number] DOB: Jul 18, 1966 Today's Date: 03/28/2016    History of Present Illness Admitted for Lt THA due to avascular necrosis. 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    PT Comments    Continues to make good progress. Ambulating up to 85 feet today at a supervision level. Mod I with transfers, verbalizes hip precautions and therapeutic exercises. States he feels confident with his abilities and hopes to return home soon. Adequate for d/c from PT standpoint when medically ready. Will follow until d/c.  Follow Up Recommendations  Home health PT;Supervision - Intermittent     Equipment Recommendations  None recommended by PT    Recommendations for Other Services       Precautions / Restrictions Precautions Precautions: Posterior Hip;Fall Precaution Comments: Recalls 3/3 precautions Restrictions Weight Bearing Restrictions: Yes LLE Weight Bearing: Weight bearing as tolerated    Mobility  Bed Mobility               General bed mobility comments: In bathroom when PT entered room  Transfers Overall transfer level: Modified independent Equipment used: None   Sit to Stand: Modified independent (Device/Increase time)         General transfer comment: slow but safe technique  Ambulation/Gait Ambulation/Gait assistance: Supervision Ambulation Distance (Feet): 85 Feet Assistive device: Rolling walker (2 wheeled) Gait Pattern/deviations: Step-through pattern;Decreased stride length;Wide base of support;Antalgic Gait velocity: decreased Gait velocity interpretation: Below normal speed for age/gender General Gait Details: VC for symmetry of  gait. No buckling noted. Decreased stride but stable and appropriately using RW for support.   Stairs            Wheelchair Mobility    Modified Rankin (Stroke Patients Only)       Balance                                    Cognition Arousal/Alertness: Awake/alert Behavior During Therapy: WFL for tasks assessed/performed Overall Cognitive Status: Within Functional Limits for tasks assessed                      Exercises      General Comments        Pertinent Vitals/Pain Pain Assessment: 0-10 Pain Score: 8  Pain Location: Bil hips and groin Pain Descriptors / Indicators: Aching Pain Intervention(s): Repositioned;Monitored during session;Premedicated before session    Home Living                      Prior Function            PT Goals (current goals can now be found in the care plan section) Acute Rehab PT Goals Patient Stated Goal: go home from the hospital PT Goal Formulation: With patient Time For Goal Achievement: 04/08/16 Potential to Achieve Goals: Good Progress towards PT goals: Progressing toward goals    Frequency  7X/week    PT Plan Current plan remains appropriate    Co-evaluation             End of Session Equipment Utilized During Treatment: Gait belt Activity Tolerance: Patient tolerated treatment well Patient left: in chair;with call bell/phone within reach  Time: [id number] PT Time Calculation (min) (ACUTE ONLY): 12 min  Charges:  $Gait Training: 8-22 mins                    G Codes:      Ellouise Newer 04/09/16, 10:15 AM  Elayne Snare, [place]

## 2016-04-02 ENCOUNTER — Other Ambulatory Visit: Payer: Self-pay

## 2016-04-02 NOTE — Telephone Encounter (Signed)
Called and notified patient of Kate's comments. Patient verbalized understanding.  

## 2016-04-02 NOTE — Telephone Encounter (Signed)
Given his history of drug and narcotic abuse, I do not believe we should provide him with alprazolam. Looks like the hospitalist provided this to him numerous months ago.  Vallarie Mare, will you please notify Kurt [name] that I will not refill alprazolam and that he may try taking Melatonin for sleep for now. I will see him on 04/07/16 as scheduled.

## 2016-04-02 NOTE — Telephone Encounter (Signed)
Pt request refill alprazolam to [facility] [address]; last refilled # 15 on 11/26/15. Pt last seen 11/28/15 and pt was to schedule f/u 4-6 weeks. Pt did not schedule f/u appt. Pt said has not needed xanax for awhile but starting to have panic attacks again. Pt also requesting med for sleep. Advised pt needed to schedule 30 min appt with Allie Bossier NP; 30 min appt scheduled for 04/07/16 at 9:45 (first available 30 min appt). Pt request cb to see if med can be refilled until appt on 04/07/16.Please advise.

## 2016-04-07 ENCOUNTER — Encounter: Payer: Self-pay | Admitting: Primary Care

## 2016-04-07 ENCOUNTER — Telehealth: Payer: Self-pay | Admitting: Primary Care

## 2016-04-07 ENCOUNTER — Ambulatory Visit (INDEPENDENT_AMBULATORY_CARE_PROVIDER_SITE_OTHER): Payer: Managed Care, Other (non HMO) | Admitting: Primary Care

## 2016-04-07 VITALS — BP 160/60 | HR 84 | Temp 97.8°F | Wt 160.8 lb

## 2016-04-07 DIAGNOSIS — F419 Anxiety disorder, unspecified: Secondary | ICD-10-CM

## 2016-04-07 DIAGNOSIS — F411 Generalized anxiety disorder: Secondary | ICD-10-CM | POA: Diagnosis not present

## 2016-04-07 DIAGNOSIS — F418 Other specified anxiety disorders: Secondary | ICD-10-CM

## 2016-04-07 DIAGNOSIS — R918 Other nonspecific abnormal finding of lung field: Secondary | ICD-10-CM

## 2016-04-07 DIAGNOSIS — F329 Major depressive disorder, single episode, unspecified: Secondary | ICD-10-CM

## 2016-04-07 DIAGNOSIS — F32A Depression, unspecified: Secondary | ICD-10-CM

## 2016-04-07 MED ORDER — HYDROXYZINE HCL 25 MG PO TABS
ORAL_TABLET | ORAL | Status: DC
Start: 2016-04-07 — End: 2016-08-12

## 2016-04-07 MED ORDER — BUPROPION HCL ER (XL) 300 MG PO TB24: 300.0000 mg | ORAL_TABLET | ORAL | Status: DC

## 2016-04-07 NOTE — Assessment & Plan Note (Addendum)
Overall improvement with Wellbutrin XL, but not quite at goal. GAD 7 score of 14 today. Denies SI/HI.  Increase Wellbutrin XL to 300 mg. Add PRN hydroxyzine for panic attacks and insomnia. Suspect insomnia is related to uncontrolled anxiety given recent surgeries and stress.  Given history of substance abuse Alprazolam would not be an appropriate treatment option.  He is to follow up in 6 weeks for re-evaluation of anxiety.

## 2016-04-07 NOTE — Patient Instructions (Signed)
We've increased your Wellbutrin XL from 150 mg to 300 mg. I've sent a new prescription to your pharmacy for the 300 mg tablets. Take 1 tablet by mouth every morning.  You may use the hydroxyzine tablets as needed for panic attacks and sleep.  Follow up in 6 weeks for re-evaluation of anxiety and panic attacks.  It was a pleasure to see you today!

## 2016-04-07 NOTE — Progress Notes (Signed)
Subjective:    Patient ID: Kurt [name]., male    DOB: 1966-01-01, 50 y.o.   MRN: [id number]  HPI  Mr. Kurt [name]. Is a 50 year old male who presents today with multiple complaints.  1) Generalized Anxiety Disorder: Previously managed on Zoloft but didn't like the way it made him feel. He was initiated on Wellbutrin XL 150 mg in December 2016 for anxiety and depression. He's noticed improvement on the Wellbutrin XL 150 mg but doesn't feel as though his anxiety is well controlled. He does experience panic attacks every once in a while and would like a medication to help control. He was once managed on alprazolam for a short period of time. Denies SI/HI. GAD 7 score of 14 today.  2) Insomnia: Difficulty with sleeping since initial surgery for avascular necrosis. He has difficulty sleeping some nights but will wake up numerous times in the middle of the night, nearly everynight. He's not taken anything OTC for sleep. He has a history of anxiety with a GAD 7 score of 14 today.  Review of Systems  Respiratory: Negative for shortness of breath.   Cardiovascular: Negative for chest pain.  Neurological: Negative for dizziness.  Psychiatric/Behavioral: Positive for sleep disturbance. Negative for suicidal ideas. The patient is nervous/anxious.        Past Medical History  Diagnosis Date  . Coronary artery disease   . Hypertension   . 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 Thyroid disease     reports that he has been low in the past but since he has stoped ETOH it has been normal  . 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([place])   . History of MI (myocardial infarction) 09/12/2015  . Coronary disease 09/12/2015  . Hyperlipidemia   . Avascular necrosis of bone of right hip ([place]) 01/14/2016  . Myocardial infarction ([place])   . Anginal pain ([place])   . Depression   . Anxiety   . Avascular necrosis of bone of left hip ([place]) 03/24/2016    Social History   Social History  . Marital Status: Legally  Separated    Spouse Name: N/A  . Number of Children: N/A  . Years of Education: N/A   Occupational History  . Not on file.   Social History Main Topics  . Smoking status: Former Smoker -- 1.50 packs/day for 20 years    Types: Cigarettes    Quit date: 11/25/2015  . Smokeless tobacco: Never Used  . Alcohol Use: No     Comment: 4 months-recovering alcoholic per pt  . Drug Use: No  . Sexual Activity: Not on file   Other Topics Concern  . Not on file   Social History Narrative   Separated.   Works as a Optician, dispensing.   Enjoys fishing, Proofreader, golfing.       Past Surgical History  Procedure Laterality Date  . Coronary stent placement    . Tonsillectomy      as a child  . Total hip arthroplasty Right 01/14/2016    Procedure: TOTAL HIP ARTHROPLASTY;  Surgeon: [name], MD;  Location: [facility];  Service: Orthopedics;  Laterality: Right;  . Cardiac catheterization      many years ago in 1990's  . Total hip arthroplasty Left 03/24/2016    Procedure: TOTAL HIP ARTHROPLASTY;  Surgeon: [name], MD;  Location: [facility];  Service: Orthopedics;  Laterality: Left;    Family History  Problem Relation Age of Onset  . Cancer Mother   .  Dementia Father   . Cancer Maternal Grandmother   . Cancer Maternal Grandfather   . Cancer Paternal Grandmother   . Cancer Paternal Grandfather     Allergies  Allergen Reactions  . Peanut-Containing Drug Products Anaphylaxis  . Erythromycin Other (See Comments)    Unknown childhood allergy    Current Outpatient Prescriptions on File Prior to Visit  Medication Sig Dispense Refill  . atorvastatin (LIPITOR) 20 MG tablet Take 1 tablet (20 mg total) by mouth daily at 6 PM. 90 tablet 3  . baclofen (LIORESAL) 10 MG tablet Take 1 tablet (10 mg total) by mouth 3 (three) times daily. As needed for muscle spasm 50 tablet 0  . carvedilol (COREG) 3.125 MG tablet Take 1 tablet (3.125 mg total) by mouth 2 (two) times daily with a meal.  (Patient taking differently: Take 3.125 mg by mouth daily. ) 180 tablet 3  . levothyroxine (SYNTHROID, LEVOTHROID) 175 MCG tablet Take 175 mcg by mouth daily before breakfast.     . lisinopril (PRINIVIL,ZESTRIL) 5 MG tablet Take 1 tablet (5 mg total) by mouth daily. 90 tablet 3  . Multiple Vitamin (MULTIVITAMIN WITH MINERALS) TABS tablet Take 1 tablet by mouth daily.    . ondansetron (ZOFRAN) 4 MG tablet Take 1 tablet (4 mg total) by mouth every 8 (eight) hours as needed for nausea or vomiting. 30 tablet 0  . oxyCODONE-acetaminophen (PERCOCET) 10-325 MG tablet Take 1-2 tablets by mouth every 6 (six) hours as needed for pain. MAXIMUM TOTAL ACETAMINOPHEN DOSE IS 4000 MG PER DAY 50 tablet 0  . rivaroxaban (XARELTO) 10 MG TABS tablet Take 1 tablet (10 mg total) by mouth daily. 21 tablet 0   No current facility-administered medications on file prior to visit.    BP 160/60 mmHg  Pulse 84  Temp(Src) 97.8 F (36.6 C)  Wt 160 lb 12.8 oz (72.938 kg)  SpO2 98%    Objective:   Physical Exam  Constitutional: He appears well-nourished.  Cardiovascular: Normal rate and regular rhythm.   Pulmonary/Chest: Effort normal and breath sounds normal.  Skin: Skin is warm and dry.  Psychiatric: He has a normal mood and affect.          Assessment & Plan:

## 2016-04-07 NOTE — Telephone Encounter (Signed)
Please notify Kurt [name] that I forgot to mention that he is due for a repeat CT of his chest. We completed this in October 2016 which found nodules/spots to his lungs that were questionable. We will need to repeat this to ensure no changes have occurred in his lungs. I've placed the orders and someone will be in contact with him soon regarding scheduling.

## 2016-04-07 NOTE — Progress Notes (Signed)
Pre visit review using our clinic review tool, if applicable. No additional management support is needed unless otherwise documented below in the visit note. 

## 2016-04-21 ENCOUNTER — Ambulatory Visit
Admission: RE | Admit: 2016-04-21 | Discharge: 2016-04-21 | Disposition: A | Discharge status: Home / Self Care | Payer: Managed Care, Other (non HMO) | Source: Ambulatory Visit | Attending: Primary Care | Admitting: Primary Care

## 2016-04-21 DIAGNOSIS — R918 Other nonspecific abnormal finding of lung field: Secondary | ICD-10-CM | POA: Insufficient documentation

## 2016-05-19 ENCOUNTER — Ambulatory Visit: Payer: Managed Care, Other (non HMO) | Admitting: Primary Care

## 2016-05-20 ENCOUNTER — Other Ambulatory Visit: Payer: Self-pay | Admitting: *Deleted

## 2016-05-20 DIAGNOSIS — E785 Hyperlipidemia, unspecified: Secondary | ICD-10-CM

## 2016-05-20 DIAGNOSIS — F411 Generalized anxiety disorder: Secondary | ICD-10-CM

## 2016-05-20 DIAGNOSIS — I10 Essential (primary) hypertension: Secondary | ICD-10-CM

## 2016-05-20 MED ORDER — LISINOPRIL 5 MG PO TABS: 5.0000 mg | ORAL_TABLET | Freq: Every day | ORAL | Status: DC

## 2016-05-20 MED ORDER — ATORVASTATIN CALCIUM 20 MG PO TABS: 20.0000 mg | ORAL_TABLET | Freq: Every day | ORAL | Status: DC

## 2016-05-20 MED ORDER — BUPROPION HCL ER (XL) 300 MG PO TB24: 300.0000 mg | ORAL_TABLET | ORAL | Status: DC

## 2016-05-20 MED ORDER — CARVEDILOL 3.125 MG PO TABS: 3.1250 mg | ORAL_TABLET | Freq: Two times a day (BID) | ORAL | Status: DC

## 2016-05-20 NOTE — Telephone Encounter (Signed)
Had to cancel previous appt due to getting a new job and not having insurance. No insurance for 60-90 days with new job. Went ahead and scheduled CPE with fasting labs for July. Would like refills on meds for 60 or 90 day supply to cover him until appt. He is very limited on funds right now.

## 2016-05-21 MED ORDER — CARVEDILOL 3.125 MG PO TABS: 3.1250 mg | ORAL_TABLET | Freq: Every day | ORAL | Status: DC

## 2016-05-21 NOTE — Telephone Encounter (Signed)
Patient called and was notified that prescriptions has been sent to [facility].   Also patient is taking carvedilol 1 tablet once a day. Correction already made.

## 2016-05-21 NOTE — Addendum Note (Signed)
Addended by: Jacqualin Combes on: 05/21/2016 04:40 PM   Modules accepted: Orders

## 2016-07-19 ENCOUNTER — Other Ambulatory Visit: Payer: Self-pay | Admitting: Primary Care

## 2016-07-19 DIAGNOSIS — I10 Essential (primary) hypertension: Secondary | ICD-10-CM

## 2016-07-19 DIAGNOSIS — E785 Hyperlipidemia, unspecified: Secondary | ICD-10-CM

## 2016-07-19 DIAGNOSIS — E039 Hypothyroidism, unspecified: Secondary | ICD-10-CM

## 2016-07-19 DIAGNOSIS — Z8042 Family history of malignant neoplasm of prostate: Secondary | ICD-10-CM

## 2016-07-21 ENCOUNTER — Other Ambulatory Visit: Payer: Managed Care, Other (non HMO)

## 2016-07-27 ENCOUNTER — Encounter: Payer: Managed Care, Other (non HMO) | Admitting: Primary Care

## 2016-08-10 ENCOUNTER — Other Ambulatory Visit (INDEPENDENT_AMBULATORY_CARE_PROVIDER_SITE_OTHER): Payer: 59

## 2016-08-10 DIAGNOSIS — E039 Hypothyroidism, unspecified: Secondary | ICD-10-CM

## 2016-08-10 DIAGNOSIS — Z8042 Family history of malignant neoplasm of prostate: Secondary | ICD-10-CM | POA: Diagnosis not present

## 2016-08-10 DIAGNOSIS — I10 Essential (primary) hypertension: Secondary | ICD-10-CM | POA: Diagnosis not present

## 2016-08-10 DIAGNOSIS — E785 Hyperlipidemia, unspecified: Secondary | ICD-10-CM | POA: Diagnosis not present

## 2016-08-10 LAB — COMPREHENSIVE METABOLIC PANEL
ALBUMIN: 4.3 g/dL (ref 3.5–5.2)
ALK PHOS: 84 U/L (ref 39–117)
ALT: 21 U/L (ref 0–53)
AST: 26 U/L (ref 0–37)
BUN: 21 mg/dL (ref 6–23)
CALCIUM: 9.7 mg/dL (ref 8.4–10.5)
CHLORIDE: 100 meq/L (ref 96–112)
CO2: 30 mEq/L (ref 19–32)
CREATININE: 0.99 mg/dL (ref 0.40–1.50)
GFR: 84.43 mL/min (ref >60.00)
Glucose, Bld: 88 mg/dL (ref 70–99)
POTASSIUM: 4.4 meq/L (ref 3.5–5.1)
Sodium: 138 mEq/L (ref 135–145)
Total Bilirubin: 0.7 mg/dL (ref 0.2–1.2)
Total Protein: 6.9 g/dL (ref 6.0–8.3)

## 2016-08-10 LAB — LIPID PANEL
CHOLESTEROL: 179 mg/dL (ref 0–200)
HDL: 58.5 mg/dL (ref >39.00)
LDL CALC: 93 mg/dL (ref 0–99)
NONHDL: 120.9
Total CHOL/HDL Ratio: 3
Triglycerides: 138 mg/dL (ref 0.0–149.0)
VLDL: 27.6 mg/dL (ref 0.0–40.0)

## 2016-08-10 LAB — TSH: TSH: 1.57 u[IU]/mL (ref 0.35–4.50)

## 2016-08-10 LAB — PSA: PSA: 0.57 ng/mL (ref 0.10–4.00)

## 2016-08-10 LAB — HEMOGLOBIN A1C: HEMOGLOBIN A1C: 5.3 % (ref 4.6–6.5)

## 2016-08-12 ENCOUNTER — Ambulatory Visit (INDEPENDENT_AMBULATORY_CARE_PROVIDER_SITE_OTHER): Payer: 59 | Admitting: Primary Care

## 2016-08-12 ENCOUNTER — Encounter: Payer: Self-pay | Admitting: Primary Care

## 2016-08-12 VITALS — BP 132/82 | HR 73 | Temp 98.3°F | Ht 72.0 in | Wt 161.8 lb

## 2016-08-12 DIAGNOSIS — E039 Hypothyroidism, unspecified: Secondary | ICD-10-CM | POA: Diagnosis not present

## 2016-08-12 DIAGNOSIS — F418 Other specified anxiety disorders: Secondary | ICD-10-CM | POA: Diagnosis not present

## 2016-08-12 DIAGNOSIS — E349 Endocrine disorder, unspecified: Secondary | ICD-10-CM

## 2016-08-12 DIAGNOSIS — F419 Anxiety disorder, unspecified: Secondary | ICD-10-CM

## 2016-08-12 DIAGNOSIS — F329 Major depressive disorder, single episode, unspecified: Secondary | ICD-10-CM

## 2016-08-12 DIAGNOSIS — Z0000 Encounter for general adult medical examination without abnormal findings: Secondary | ICD-10-CM

## 2016-08-12 DIAGNOSIS — R5383 Other fatigue: Secondary | ICD-10-CM

## 2016-08-12 DIAGNOSIS — I10 Essential (primary) hypertension: Secondary | ICD-10-CM

## 2016-08-12 DIAGNOSIS — F32A Depression, unspecified: Secondary | ICD-10-CM

## 2016-08-12 DIAGNOSIS — E785 Hyperlipidemia, unspecified: Secondary | ICD-10-CM

## 2016-08-12 DIAGNOSIS — E291 Testicular hypofunction: Secondary | ICD-10-CM

## 2016-08-12 NOTE — Assessment & Plan Note (Signed)
Endorses more fatigue. Given history of testosterone deficiency with normal TSH and other labs, we'll check testosterone level.

## 2016-08-12 NOTE — Patient Instructions (Addendum)
It is important that you improve your diet. Please limit fast food, fried foods, processed foods, etc. Increase your consumption of fresh fruits and vegetables, whole grains, lean protein.  Ensure you are consuming 64 ounces of water daily.  Start exercising. You should be getting 1 hour of moderate intensity exercise 3-5 days weekly.  Schedule a lab only appointment at your convenience between the hours of 8 am to 10 am for a testosterone check.  Follow up in 1 year for repeat physical or sooner if needed.  It was a pleasure to see you today!

## 2016-08-12 NOTE — Assessment & Plan Note (Signed)
Stable in the office today. Continue carvedilol, lisinopril.

## 2016-08-12 NOTE — Assessment & Plan Note (Signed)
Recent lipid panel stable. Continue atorvastatin 20 mg.

## 2016-08-12 NOTE — Progress Notes (Signed)
   Subjective:    Patient ID: Salar Kolba., male    DOB: 1966/12/23, 50 y.o.   MRN: [id number]  HPI  Mr. Dasmine Troeger is a 50 year old male who presents today for complete physical.  Immunizations: -Tetanus: Completed in 2011 -Influenza: Did not complete last season -Pneumonia: Completed Pneumovax in 2009  Diet: He endorses a fair diet Breakfast: Fast food Lunch: Fast food, salad Dinner: Meat, vegetables, potatoes, fast food Snacks: None Desserts: None Beverages: Sweet tea, water, coffee, occasional soda  Wt Readings from Last 3 Encounters:  08/12/16 161 lb 12.8 oz (73.4 kg)  04/07/16 160 lb 12.8 oz (72.9 kg)  03/24/16 147 lb 8 oz (66.9 kg)     Exercise: Does not currently exercise. Eye exam: Completed 1 year ago. Dental exam: Has not completed in years. Colonoscopy: Completed within 5-10 years ago.   Review of Systems  Constitutional: Negative for unexpected weight change.  HENT: Negative for rhinorrhea.   Respiratory: Negative for cough and shortness of breath.   Cardiovascular: Negative for chest pain.  Gastrointestinal: Negative for blood in stool, constipation and diarrhea.  Genitourinary: Negative for difficulty urinating.  Musculoskeletal: Negative for arthralgias and myalgias.       Chronic knee, hips, lower extremity, lower back  Skin: Negative for rash.  Allergic/Immunologic: Negative for environmental allergies.  Neurological: Negative for dizziness, numbness and headaches.  Psychiatric/Behavioral:       Overall feels well managed.        Objective:   Physical Exam  Constitutional: He is oriented to person, place, and time. He appears well-nourished.  HENT:  Right Ear: Tympanic membrane and ear canal normal.  Left Ear: Tympanic membrane and ear canal normal.  Nose: Nose normal. Right sinus exhibits no maxillary sinus tenderness and no frontal sinus tenderness. Left sinus exhibits no maxillary sinus tenderness and no frontal sinus tenderness.    Mouth/Throat: Oropharynx is clear and moist.  Eyes: Conjunctivae and EOM are normal. Pupils are equal, round, and reactive to light.  Neck: Neck supple. Carotid bruit is not present. No thyromegaly present.  Cardiovascular: Normal rate, regular rhythm and normal heart sounds.   Pulmonary/Chest: Effort normal and breath sounds normal. He has no wheezes. He has no rales.  Abdominal: Soft. Bowel sounds are normal. There is no tenderness.  Musculoskeletal:  Decreased range of motion during ambulation initially upon rising from seated position.  Neurological: He is alert and oriented to person, place, and time. He has normal reflexes. No cranial nerve deficit.  Skin: Skin is warm and dry.  Psychiatric: He has a normal mood and affect.          Assessment & Plan:

## 2016-08-12 NOTE — Assessment & Plan Note (Signed)
Feels well managed on current regimen. Continue Wellbutrin XL 300 mg. Denies SI/HI.

## 2016-08-12 NOTE — Assessment & Plan Note (Signed)
Immunizations up-to-date. Colonoscopy up-to-date per patient. Discussed the importance of a healthy diet and regular exercise in order to reduce risk of other medical diseases. Exam today unremarkable. Labs stable. Will evaluate testosterone level given history of deficiency coupled with fatigue. Follow-up in one year for repeat physical.

## 2016-08-12 NOTE — Progress Notes (Signed)
Pre visit review using our clinic review tool, if applicable. No additional management support is needed unless otherwise documented below in the visit note. 

## 2016-08-12 NOTE — Assessment & Plan Note (Signed)
TSH stable on recent labs. Continue levothyroxine 175 mcg tablets.

## 2016-09-24 ENCOUNTER — Telehealth: Payer: Self-pay | Admitting: Cardiovascular Disease

## 2016-09-24 NOTE — Telephone Encounter (Signed)
3 attempts to schedule fu from recall.  lmov to call office deleting recall.

## 2016-11-12 IMAGING — CT CT CHEST W/O CM
1 series · 15 of 34 positions shown, 19 images · non-contrast
Comparison: None.

Prior chest CT 10/03/2015

CLINICAL DATA: 49-year-old male with a history of pulmonary nodules

EXAM:
CT CHEST WITHOUT CONTRAST
TECHNIQUE: Multidetector CT imaging of the chest was performed following the
standard protocol without IV contrast.

[Series 2: thorax · axial · 0.72mm/px · z∈[-804,-506]mm · 15 of 177 slices shown, 19 images]
[im 14/177  mediastinal]
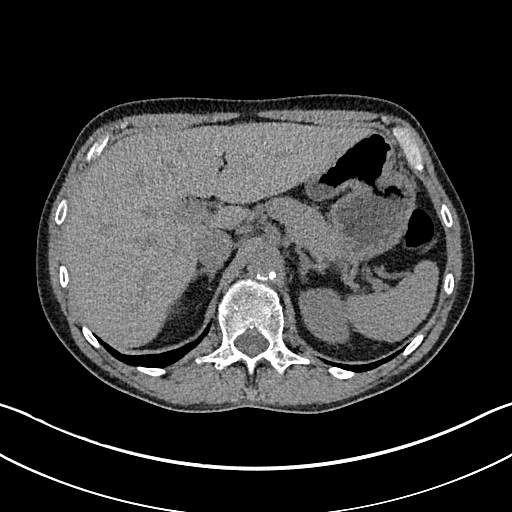
[im 14/177  lung]
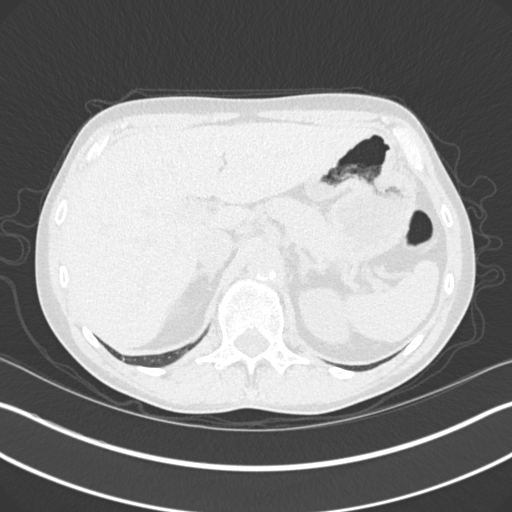
[im 27/177  lung]
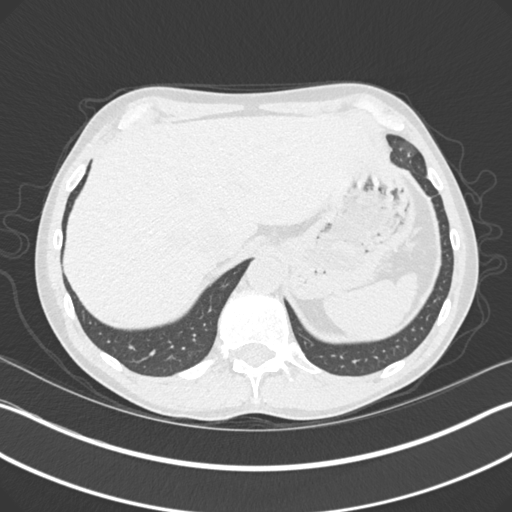
[im 36/177  lung]
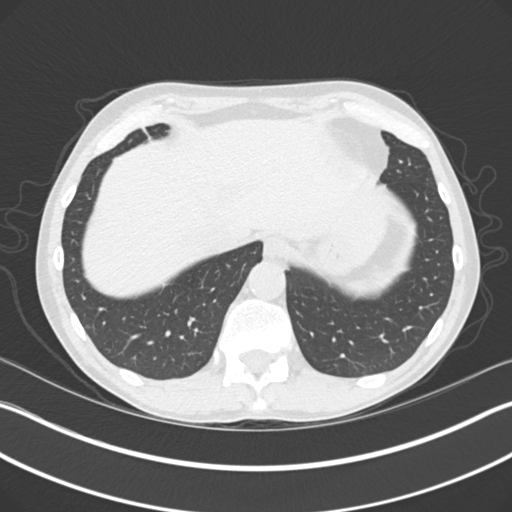
[im 46/177  lung]
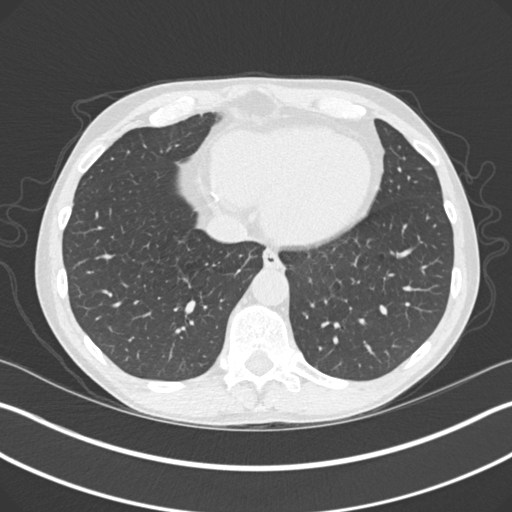
[im 59/177  mediastinal]
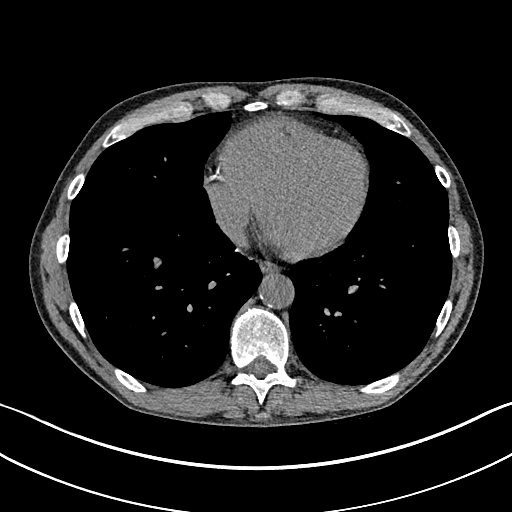
[im 59/177  lung]
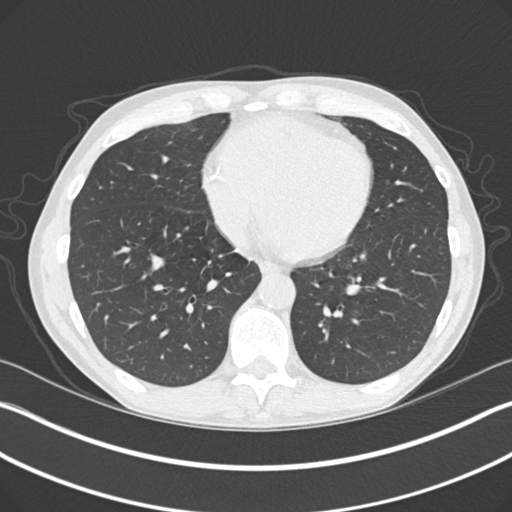
[im 71/177  lung]
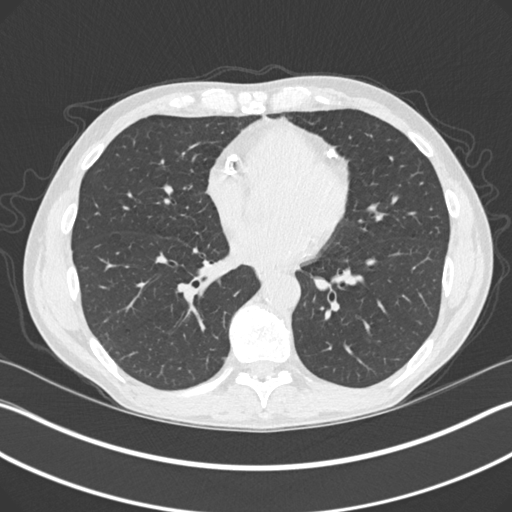
[im 79/177  lung]
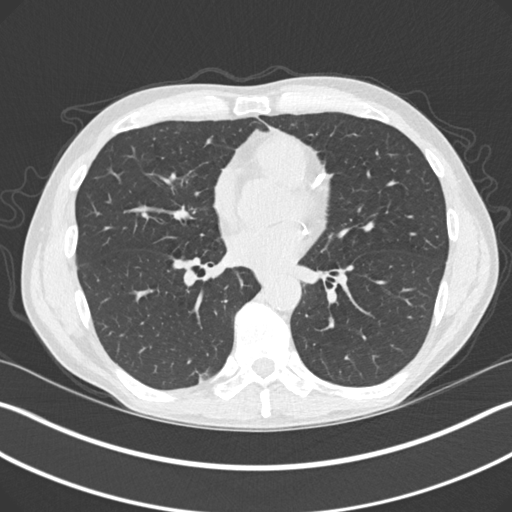
[im 92/177  lung]
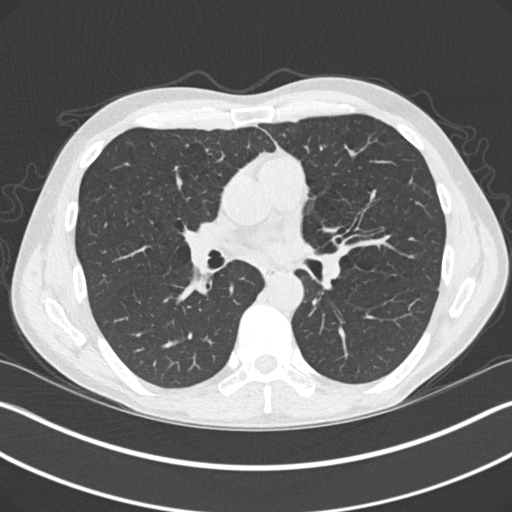
[im 98/177  mediastinal]
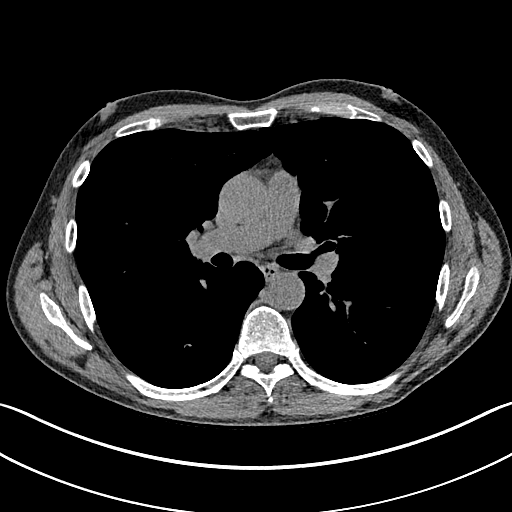
[im 98/177  lung]
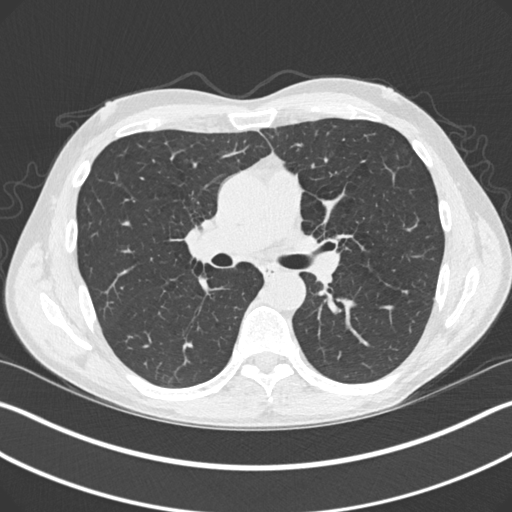
[im 106/177  lung]
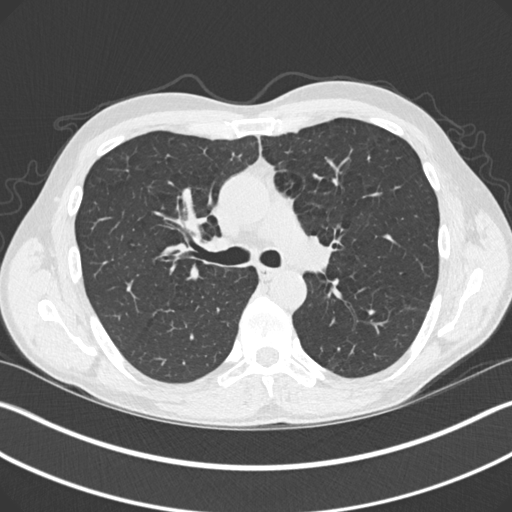
[im 118/177  lung]
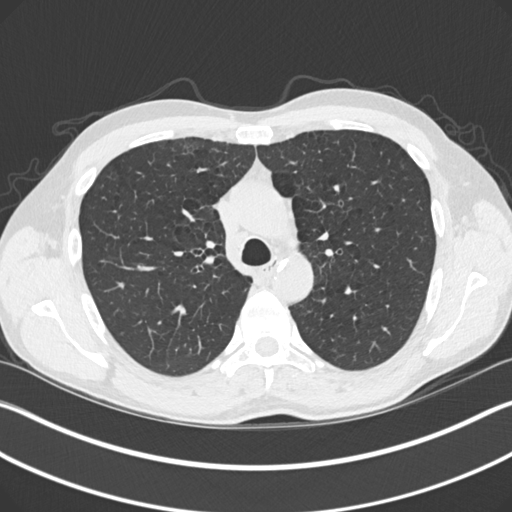
[im 131/177  lung]
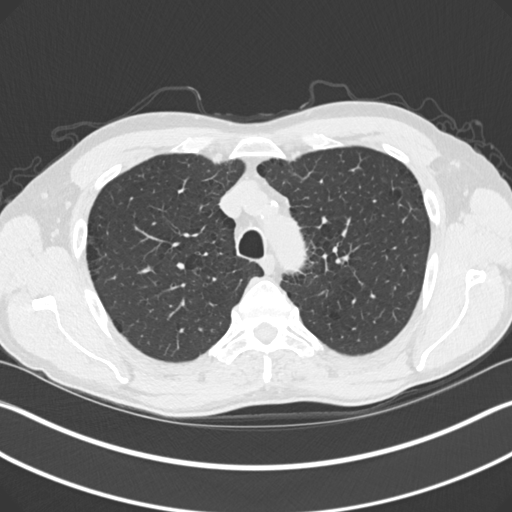
[im 141/177  mediastinal]
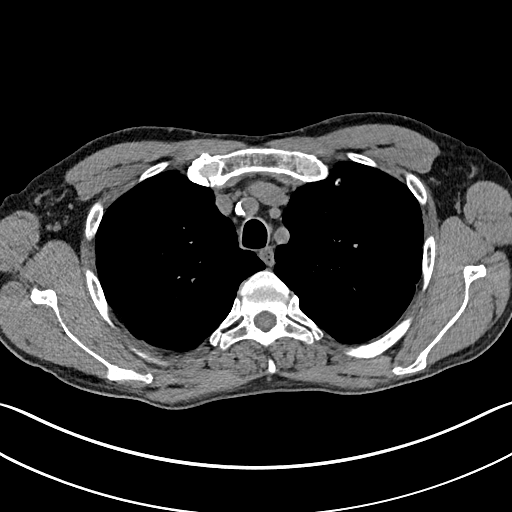
[im 141/177  lung]
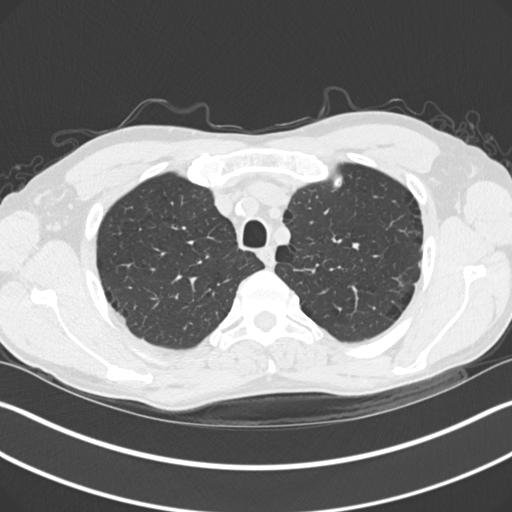
[im 150/177  lung]
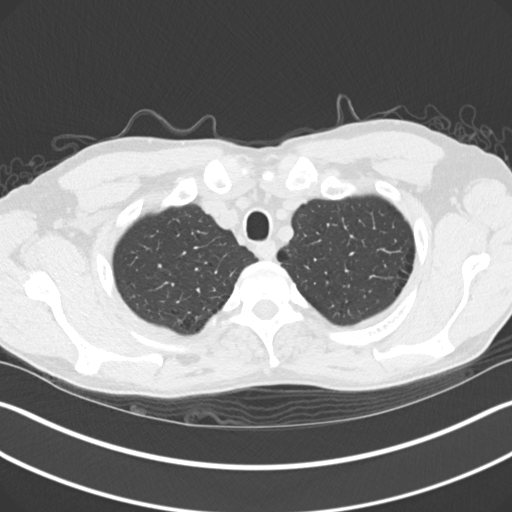
[im 163/177  lung]
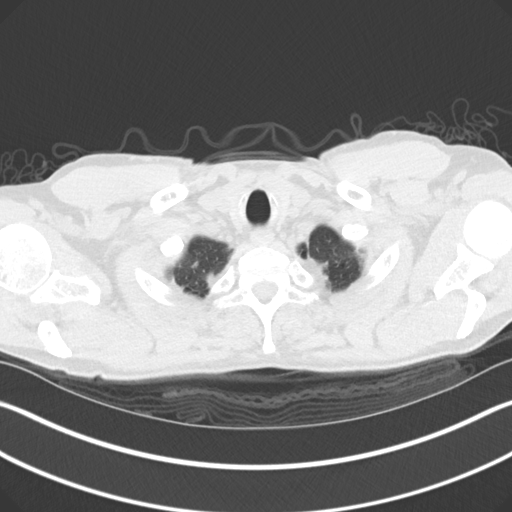

[15 of 34 positions shown; findings below may reference images not displayed]

FINDINGS: Mediastinum/Lymph Nodes: No masses or pathologically enlarged lymph
nodes identified on this un-enhanced exam. Normal caliber aorta. The
heart is normal in size. No pericardial effusion. Calcifications
present along the coronary arteries. Additionally, there are likely
coronary artery stents.

Lungs/Pleura: Stable 5 mm left upper lobe pulmonary nodule (image 40
series 3). Stable to slightly decreased size of right lower lobe
pulmonary nodule presently measuring 5 mm compared to 6 mm
previously. Subtle difference likely within measurement error. No
new pulmonary nodules are identified. There is a background of mild
paraseptal emphysema. The lungs are otherwise clear.

Upper abdomen: No acute findings.

Musculoskeletal: No chest wall mass or suspicious bone lesions
identified.
IMPRESSION: There has been no interval change in the size or appearance of the
small (5 mm or less) bilateral pulmonary nodules. No follow-up
needed if patient is low-risk (and has no known or suspected primary
neoplasm). Non-contrast chest CT can be considered in 12 months if
patient is high-risk. This recommendation follows the consensus
statement: Guidelines for Management of Incidental Pulmonary Nodules
Detected on CT Images:From the [HOSPITAL] 7322; published
online before print (10.1148/[id number]).

## 2017-01-24 ENCOUNTER — Other Ambulatory Visit: Payer: Self-pay | Admitting: Primary Care

## 2017-01-24 DIAGNOSIS — I10 Essential (primary) hypertension: Secondary | ICD-10-CM

## 2017-02-24 ENCOUNTER — Encounter: Payer: Self-pay | Admitting: Primary Care

## 2017-02-24 ENCOUNTER — Ambulatory Visit (INDEPENDENT_AMBULATORY_CARE_PROVIDER_SITE_OTHER): Payer: 59 | Admitting: Primary Care

## 2017-02-24 VITALS — BP 124/80 | HR 71 | Temp 98.1°F | Ht 72.0 in | Wt 176.8 lb

## 2017-02-24 DIAGNOSIS — J029 Acute pharyngitis, unspecified: Secondary | ICD-10-CM

## 2017-02-24 LAB — POCT RAPID STREP A (OFFICE): Rapid Strep A Screen: NEGATIVE

## 2017-02-24 NOTE — Progress Notes (Signed)
Subjective:    Patient ID: Kurt [name]., male    DOB: 1966/11/15, 51 y.o.   MRN: [id number]  HPI  Mr. Kurt [name] is a 51 year old male who presents today with a chief complaint of sore throat. He also reports fever 102 Tuesday night, no fever since), swelling to his hard palate, under his tongue. His symptoms have been present for the past 2 days. He denies chills, body aches, cough, congestion. He's taken Aspirin with improvement in swelling and fever.   Review of Systems  Constitutional: Positive for fever. Negative for chills.  HENT: Positive for sore throat. Negative for congestion, ear pain and sinus pressure.        Swelling to hard palate and sublingual region  Respiratory: Negative for cough and shortness of breath.   Musculoskeletal: Negative for myalgias.       Past Medical History:  Diagnosis Date  . Anginal pain ([place])   . Anxiety   . Avascular necrosis of bone of left hip ([place]) 03/24/2016  . Avascular necrosis of bone of right hip ([place]) 01/14/2016  . Coronary artery disease   . Coronary disease 09/12/2015  . Depression   . History of MI (myocardial infarction) 09/12/2015  . Hyperlipidemia   . Hypertension   . Myocardial infarction   . 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([place])   . Thyroid disease    reports that he has been low in the past but since he has stoped ETOH it has been normal     Social History   Social History  . Marital status: Legally Separated    Spouse name: N/A  . Number of children: N/A  . Years of education: N/A   Occupational History  . Not on file.   Social History Main Topics  . Smoking status: Former Smoker    Packs/day: 1.50    Years: 20.00    Types: Cigarettes    Quit date: 11/25/2015  . Smokeless tobacco: Never Used  . Alcohol use No     Comment: 4 months-recovering alcoholic per pt  . Drug use: No  . Sexual activity: Not on file   Other Topics Concern  . Not on file   Social History  Narrative   Separated.   Works as a Optician, dispensing.   Enjoys fishing, Proofreader, golfing.       Past Surgical History:  Procedure Laterality Date  . CARDIAC CATHETERIZATION     many years ago in 1990's  . CORONARY STENT PLACEMENT    . TONSILLECTOMY     as a child  . TOTAL HIP ARTHROPLASTY Right 01/14/2016   Procedure: TOTAL HIP ARTHROPLASTY;  Surgeon: [name], MD;  Location: [facility];  Service: Orthopedics;  Laterality: Right;  . TOTAL HIP ARTHROPLASTY Left 03/24/2016   Procedure: TOTAL HIP ARTHROPLASTY;  Surgeon: [name], MD;  Location: [facility];  Service: Orthopedics;  Laterality: Left;    Family History  Problem Relation Age of Onset  . Cancer Mother   . Dementia Father   . Cancer Maternal Grandmother   . Cancer Maternal Grandfather   . Cancer Paternal Grandmother   . Cancer Paternal Grandfather     Allergies  Allergen Reactions  . Peanut-Containing Drug Products Anaphylaxis  . Erythromycin Other (See Comments)    Unknown childhood allergy    Current Outpatient Prescriptions on File Prior to Visit  Medication Sig Dispense Refill  . atorvastatin (LIPITOR) 20 MG tablet Take 1  tablet (20 mg total) by mouth daily at 6 PM. 90 tablet 1  . buPROPion (WELLBUTRIN XL) 300 MG 24 hr tablet Take 1 tablet (300 mg total) by mouth every morning. 90 tablet 1  . carvedilol (COREG) 3.125 MG tablet Take 1 tablet (3.125 mg total) by mouth daily. 90 tablet 1  . levothyroxine (SYNTHROID, LEVOTHROID) 175 MCG tablet Take 175 mcg by mouth daily before breakfast.     . lisinopril (PRINIVIL,ZESTRIL) 5 MG tablet TAKE ONE TABLET BY MOUTH ONCE DAILY 90 tablet 1  . Multiple Vitamin (MULTIVITAMIN WITH MINERALS) TABS tablet Take 1 tablet by mouth daily.    . rivaroxaban (XARELTO) 10 MG TABS tablet Take 1 tablet (10 mg total) by mouth daily. (Patient not taking: Reported on 08/12/2016) 21 tablet 0   No current facility-administered medications on file prior to visit.     BP 124/80    Pulse 71   Temp 98.1 F (36.7 C) (Oral)   Ht 6' (1.829 m)   Wt 176 lb 12.8 oz (80.2 kg)   SpO2 97%   BMI 23.98 kg/m    Objective:   Physical Exam  Constitutional: He appears well-nourished.  HENT:  Right Ear: Tympanic membrane and ear canal normal.  Left Ear: Tympanic membrane and ear canal normal.  Nose: No mucosal edema. Right sinus exhibits no maxillary sinus tenderness and no frontal sinus tenderness. Left sinus exhibits no maxillary sinus tenderness and no frontal sinus tenderness.  Mouth/Throat: Posterior oropharyngeal erythema present. No oropharyngeal exudate or posterior oropharyngeal edema.  No obvious swelling to hard palpate or sublingual area  Eyes: Conjunctivae are normal.  Neck: Neck supple.  Mild swelling to bilateral submandibular notes, tender.  Cardiovascular: Normal rate and regular rhythm.   Pulmonary/Chest: Effort normal and breath sounds normal. He has no wheezes. He has no rales.  Skin: Skin is warm and dry.          Assessment & Plan:  Sore Throat:  Also with swelling and painful glands, swelling to hard palate and sublingual. Some improvement with Aspirin, no fevers today. Exam with tenderness to bilateral lateral submandibular lymph notes. Does not appear acutely ill, vitals stable. Rapid strep: Negative Suspect viral involvement and will treat with supportive measures.  Will treat with Ibuprofen, warm salt gargles.  Return precautions provided.  Sheral Flow, NP

## 2017-02-24 NOTE — Patient Instructions (Signed)
Your strep test was negative.  Your symptoms are representative of a viral illness which will resolve on its own over time. Our goal is to treat your symptoms in order to aid your body in the healing process and to make you more comfortable.   Start Ibuprofen 600 mg three times daily as needed for pain and inflammation.   Try warm, salt gargles three times daily to help soothe throat. You can also try chloraseptic spray, or throat lozenges.   Please call me if you continue to notice fevers over 101, your swelling gets worse, you cannot swallow.  It was a pleasure to see you today!

## 2017-02-24 NOTE — Progress Notes (Signed)
Pre visit review using our clinic review tool, if applicable. No additional management support is needed unless otherwise documented below in the visit note. 

## 2017-02-25 NOTE — Addendum Note (Signed)
Addended by: Jacqualin Combes on: 02/25/2017 07:52 AM   Modules accepted: Orders

## 2017-03-20 ENCOUNTER — Other Ambulatory Visit: Payer: Self-pay | Admitting: Primary Care

## 2017-03-20 DIAGNOSIS — E785 Hyperlipidemia, unspecified: Secondary | ICD-10-CM

## 2017-06-11 ENCOUNTER — Other Ambulatory Visit: Payer: Self-pay | Admitting: Primary Care

## 2017-06-11 DIAGNOSIS — F411 Generalized anxiety disorder: Secondary | ICD-10-CM

## 2017-06-11 NOTE — Telephone Encounter (Signed)
Pt wanted to ck on status of wellbutrin refill to [facility] [address]. Advised pt refill done; pt will ck with pharmacy.

## 2017-08-19 ENCOUNTER — Telehealth: Payer: Self-pay | Admitting: Primary Care

## 2017-08-19 DIAGNOSIS — I10 Essential (primary) hypertension: Secondary | ICD-10-CM

## 2017-08-19 DIAGNOSIS — E785 Hyperlipidemia, unspecified: Secondary | ICD-10-CM

## 2017-08-19 NOTE — Telephone Encounter (Signed)
Last labs/OV 08-12-16. No future OV

## 2017-08-20 NOTE — Telephone Encounter (Signed)
Message left for patient to return my call.  

## 2017-08-20 NOTE — Telephone Encounter (Signed)
Due for office visit follow up, please schedule. Will allow one month supply of medications.

## 2017-08-23 NOTE — Telephone Encounter (Signed)
Message left for patient to return my call.  Letter have send to patient regarding request for office visit.

## 2017-08-25 NOTE — Telephone Encounter (Signed)
Spoken to patient and notified that Rx was sent to the pharmacy on 08/20/2017.  Follow up has been schedule for 10/20/2017

## 2017-08-25 NOTE — Telephone Encounter (Signed)
Pt left v/m requesting cb about status of refill for lisinopril and atorvastatin. Pt is out of med.

## 2017-09-13 ENCOUNTER — Other Ambulatory Visit: Payer: Self-pay | Admitting: Primary Care

## 2017-09-13 DIAGNOSIS — E039 Hypothyroidism, unspecified: Secondary | ICD-10-CM

## 2017-09-13 DIAGNOSIS — E785 Hyperlipidemia, unspecified: Secondary | ICD-10-CM

## 2017-09-24 ENCOUNTER — Other Ambulatory Visit (INDEPENDENT_AMBULATORY_CARE_PROVIDER_SITE_OTHER): Payer: 59

## 2017-09-24 DIAGNOSIS — E039 Hypothyroidism, unspecified: Secondary | ICD-10-CM | POA: Diagnosis not present

## 2017-09-24 DIAGNOSIS — E785 Hyperlipidemia, unspecified: Secondary | ICD-10-CM

## 2017-09-24 LAB — COMPREHENSIVE METABOLIC PANEL
ALBUMIN: 4.3 g/dL (ref 3.5–5.2)
ALK PHOS: 79 U/L (ref 39–117)
ALT: 149 U/L — ABNORMAL HIGH (ref 0–53)
AST: 87 U/L — ABNORMAL HIGH (ref 0–37)
BUN: 14 mg/dL (ref 6–23)
CALCIUM: 9.8 mg/dL (ref 8.4–10.5)
CHLORIDE: 100 meq/L (ref 96–112)
CO2: 29 mEq/L (ref 19–32)
Creatinine, Ser: 0.87 mg/dL (ref 0.40–1.50)
GFR: 97.58 mL/min (ref >60.00)
Glucose, Bld: 90 mg/dL (ref 70–99)
POTASSIUM: 4.2 meq/L (ref 3.5–5.1)
Sodium: 135 mEq/L (ref 135–145)
TOTAL PROTEIN: 7.2 g/dL (ref 6.0–8.3)
Total Bilirubin: 0.6 mg/dL (ref 0.2–1.2)

## 2017-09-24 LAB — TSH: TSH: 2.06 u[IU]/mL (ref 0.35–4.50)

## 2017-09-24 LAB — LIPID PANEL
CHOLESTEROL: 186 mg/dL (ref 0–200)
HDL: 77.6 mg/dL (ref >39.00)
LDL Cholesterol: 97 mg/dL (ref 0–99)
NonHDL: 108.4
Total CHOL/HDL Ratio: 2
Triglycerides: 56 mg/dL (ref 0.0–149.0)
VLDL: 11.2 mg/dL (ref 0.0–40.0)

## 2017-09-28 ENCOUNTER — Encounter: Payer: 59 | Admitting: Primary Care

## 2017-10-04 ENCOUNTER — Encounter: Payer: Self-pay | Admitting: Primary Care

## 2017-10-04 ENCOUNTER — Ambulatory Visit (INDEPENDENT_AMBULATORY_CARE_PROVIDER_SITE_OTHER): Payer: 59 | Admitting: Primary Care

## 2017-10-04 VITALS — BP 114/70 | HR 85 | Temp 98.4°F | Ht 72.0 in | Wt 176.1 lb

## 2017-10-04 DIAGNOSIS — F419 Anxiety disorder, unspecified: Secondary | ICD-10-CM | POA: Diagnosis not present

## 2017-10-04 DIAGNOSIS — R05 Cough: Secondary | ICD-10-CM

## 2017-10-04 DIAGNOSIS — Z1211 Encounter for screening for malignant neoplasm of colon: Secondary | ICD-10-CM

## 2017-10-04 DIAGNOSIS — J449 Chronic obstructive pulmonary disease, unspecified: Secondary | ICD-10-CM | POA: Insufficient documentation

## 2017-10-04 DIAGNOSIS — I10 Essential (primary) hypertension: Secondary | ICD-10-CM | POA: Diagnosis not present

## 2017-10-04 DIAGNOSIS — E785 Hyperlipidemia, unspecified: Secondary | ICD-10-CM | POA: Diagnosis not present

## 2017-10-04 DIAGNOSIS — E039 Hypothyroidism, unspecified: Secondary | ICD-10-CM | POA: Diagnosis not present

## 2017-10-04 DIAGNOSIS — F329 Major depressive disorder, single episode, unspecified: Secondary | ICD-10-CM | POA: Diagnosis not present

## 2017-10-04 DIAGNOSIS — Z0001 Encounter for general adult medical examination with abnormal findings: Secondary | ICD-10-CM | POA: Diagnosis not present

## 2017-10-04 DIAGNOSIS — R7989 Other specified abnormal findings of blood chemistry: Secondary | ICD-10-CM | POA: Insufficient documentation

## 2017-10-04 DIAGNOSIS — Z0000 Encounter for general adult medical examination without abnormal findings: Secondary | ICD-10-CM

## 2017-10-04 DIAGNOSIS — R945 Abnormal results of liver function studies: Secondary | ICD-10-CM | POA: Diagnosis not present

## 2017-10-04 DIAGNOSIS — R059 Cough, unspecified: Secondary | ICD-10-CM

## 2017-10-04 DIAGNOSIS — R351 Nocturia: Secondary | ICD-10-CM | POA: Diagnosis not present

## 2017-10-04 DIAGNOSIS — F32A Depression, unspecified: Secondary | ICD-10-CM

## 2017-10-04 MED ORDER — ALBUTEROL SULFATE HFA 108 (90 BASE) MCG/ACT IN AERS: 2.0000 | INHALATION_SPRAY | RESPIRATORY_TRACT | 0 refills | Status: DC | PRN

## 2017-10-04 NOTE — Assessment & Plan Note (Signed)
TSH unremarkable. Continue to monitor.

## 2017-10-04 NOTE — Assessment & Plan Note (Signed)
Intermittent. Discussed to limit caffeine after 3 pm, limit fluids before bedtime. PSA in 2017 unremarkable. Consider repeat PSA and treatment for potential BPH if symptoms persist despite these interventions. He will update.

## 2017-10-04 NOTE — Assessment & Plan Note (Signed)
Endorses that he's not taken lisinopril in 1 year. BP stable in the office today on carvedilol. If he's in fact off of lisinopril then ACE induced cough is off the differential list. He will double check his medications at home.

## 2017-10-04 NOTE — Assessment & Plan Note (Signed)
Normal LFT's 1 year ago. He denies alcohol consumption, chronic tylenol use. Does take aspirin daily. Will start by repeating labs in 4-6 weeks and if above goal then will obtain ultrasound. Consider statin and aspirin as cause, however, he's taken both of these for years with normal CMP one year ago.

## 2017-10-04 NOTE — Assessment & Plan Note (Signed)
Doing well on Wellbutrin, continue same.

## 2017-10-04 NOTE — Patient Instructions (Signed)
You will be contacted regarding your referral to GI for the colonoscopy.  Please let us know if you have not heard back within one week.   Shortness of Breath/Wheezing/Cough: Use the albuterol inhaler. Inhale 2 puffs into the lungs every 4-6 hours as needed for wheezing and/or shortness of breath for a max of 2 weeks. Only use this as needed.  Start Zyrtec/Claritin/Allegra for allergies, take 1 tablet by mouth once daily.  Please notify me if no improvement in symptoms in 2 weeks.  Start exercising. You should be getting 150 minutes of moderate intensity exercise weekly.  Increase consumption of vegetables, fruit, whole grains.  Ensure you are consuming 64 ounces of water daily.  Limit caffeine after 3 pm and oral intake prior to bed.  Follow up in 1 year for your annual exam or sooner if needed.  It was a pleasure to see you today!

## 2017-10-04 NOTE — Progress Notes (Signed)
Subjective:    Patient ID: Kurt [name]., male    DOB: 27-Jan-1966, 51 y.o.   MRN: [id number]  HPI  Kurt [name] is a 51 year old male who presents today for complete physical.  Nocturia: Will get up 2-4 days weekly 2-3 times at a time; sometimes has no nocturia. He sometimes will drink sweet tea or water before bed. He drinks sweet tea for all meals. He denies hematuria, urinary frequency, dysuria, unexplained weight loss.  Cough: Dry cough for the past 2-3 weeks, occurs numerous times daily. Denies fevers, chills, sore throat, shortness of breath, nasal congestion, rhinorria. Cough is occasional productive with wheezing, mostly dry. Occasional esophageal burning. Cough is present every morning and night. Prior tobacco abuse, quit in 2016.  Immunizations: -Tetanus: Completed in 2011 -Influenza: Declines -Pneumonia: Completed Pneumovax in 2009.   Diet: He endorses a fair diet. Breakfast: Fast food Lunch: Fast food Dinner: Meat, vegetable, starch Snacks: None Desserts: None Beverages: Sweet, water, Soda  Exercise: He does not exercise.  Eye exam: Completed in 2018 Dental exam: Completes annually Colonoscopy: Completed over 10 years ago. Due. PSA: Negative in 2017 Hep C Screen: Negative in 2016   Review of Systems  Constitutional: Negative for unexpected weight change.  HENT: Negative for rhinorrhea.   Respiratory: Positive for cough. Negative for shortness of breath.   Cardiovascular: Negative for chest pain.  Gastrointestinal: Negative for constipation and diarrhea.  Genitourinary: Negative for difficulty urinating, dysuria, flank pain and hematuria.       Nocturia  Musculoskeletal: Negative for arthralgias and myalgias.  Skin: Negative for rash.  Allergic/Immunologic: Negative for environmental allergies.  Neurological: Negative for dizziness, numbness and headaches.  Psychiatric/Behavioral:       Doing well on Wellbutrin       Past Medical History:    Diagnosis Date  . Anginal pain ([place])   . Anxiety   . Avascular necrosis of bone of left hip ([place]) 03/24/2016  . Avascular necrosis of bone of right hip ([place]) 01/14/2016  . Coronary artery disease   . Coronary disease 09/12/2015  . Depression   . History of MI (myocardial infarction) 09/12/2015  . Hyperlipidemia   . Hypertension   . Myocardial infarction ([place])   . 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([place])   . Thyroid disease    reports that he has been low in the past but since he has stoped ETOH it has been normal     Social History   Social History  . Marital status: Legally Separated    Spouse name: N/A  . Number of children: N/A  . Years of education: N/A   Occupational History  . Not on file.   Social History Main Topics  . Smoking status: Former Smoker    Packs/day: 1.50    Years: 20.00    Types: Cigarettes    Quit date: 11/25/2015  . Smokeless tobacco: Never Used  . Alcohol use No     Comment: 4 months-recovering alcoholic per pt  . Drug use: No  . Sexual activity: Not on file   Other Topics Concern  . Not on file   Social History Narrative   Separated.   Works as a Optician, dispensing.   Enjoys fishing, Proofreader, golfing.       Past Surgical History:  Procedure Laterality Date  . CARDIAC CATHETERIZATION     many years ago in 1990's  . CORONARY STENT PLACEMENT    .  TONSILLECTOMY     as a child  . TOTAL HIP ARTHROPLASTY Right 01/14/2016   Procedure: TOTAL HIP ARTHROPLASTY;  Surgeon: [name], MD;  Location: [facility];  Service: Orthopedics;  Laterality: Right;  . TOTAL HIP ARTHROPLASTY Left 03/24/2016   Procedure: TOTAL HIP ARTHROPLASTY;  Surgeon: [name], MD;  Location: [facility];  Service: Orthopedics;  Laterality: Left;    Family History  Problem Relation Age of Onset  . Cancer Mother   . Dementia Father   . Cancer Maternal Grandmother   . Cancer Maternal Grandfather   . Cancer Paternal Grandmother   .  Cancer Paternal Grandfather     Allergies  Allergen Reactions  . Peanut-Containing Drug Products Anaphylaxis  . Erythromycin Other (See Comments)    Unknown childhood allergy    Current Outpatient Prescriptions on File Prior to Visit  Medication Sig Dispense Refill  . atorvastatin (LIPITOR) 20 MG tablet TAKE 1 TABLET BY MOUTH ONCE DAILY AT 6 IN THE EVENING 30 tablet 0  . buPROPion (WELLBUTRIN XL) 300 MG 24 hr tablet TAKE ONE TABLET BY MOUTH IN THE MORNING 90 tablet 1  . carvedilol (COREG) 3.125 MG tablet Take 1 tablet (3.125 mg total) by mouth daily. 90 tablet 1  . Multiple Vitamin (MULTIVITAMIN WITH MINERALS) TABS tablet Take 1 tablet by mouth daily.     No current facility-administered medications on file prior to visit.     BP 114/70   Pulse 85   Temp 98.4 F (36.9 C) (Oral)   Ht 6' (1.829 m)   Wt 176 lb 1.9 oz (79.9 kg)   SpO2 97%   BMI 23.89 kg/m    Objective:   Physical Exam  Constitutional: He is oriented to person, place, and time. He appears well-nourished.  HENT:  Right Ear: Tympanic membrane and ear canal normal.  Left Ear: Tympanic membrane and ear canal normal.  Nose: Nose normal. Right sinus exhibits no maxillary sinus tenderness and no frontal sinus tenderness. Left sinus exhibits no maxillary sinus tenderness and no frontal sinus tenderness.  Mouth/Throat: Oropharynx is clear and moist.  Eyes: Pupils are equal, round, and reactive to light. Conjunctivae and EOM are normal.  Neck: Neck supple. Carotid bruit is not present. No thyromegaly present.  Cardiovascular: Normal rate, regular rhythm and normal heart sounds.   Pulmonary/Chest: Effort normal. He has wheezes in the right upper field and the left upper field. He has no rales.  Abdominal: Soft. Bowel sounds are normal. There is no tenderness.  Musculoskeletal: Normal range of motion.  Neurological: He is alert and oriented to person, place, and time. He has normal reflexes. No cranial nerve deficit.    Skin: Skin is warm and dry.  Psychiatric: He has a normal mood and affect.          Assessment & Plan:

## 2017-10-04 NOTE — Assessment & Plan Note (Signed)
Long history of tobacco abuse, quit in 2016. Repeat CT chest in 2017 without change in lung nodules, he kindly declines repeat CT this year, this is reasonable as he has no alarm signs.   Based off of examination and HPI, suspect cough to be secondary to COPD and/or allergies. Rx for albuterol inhaler sent to pharmacy, discussed instructions for use, he will update if no improvement and/or if he requires use of the albuterol inhaler for more than three days weekly after 2 weeks time.

## 2017-10-04 NOTE — Assessment & Plan Note (Signed)
Well controlled on atorvastatin.

## 2017-10-04 NOTE — Assessment & Plan Note (Signed)
Td UTD, declines influenza vaccination. PSA due next year. Colonoscopy due, pending. Discussed the importance of a healthy diet and regular exercise in order to reduce the risk of other medical problems. Exam with mild wheezing, otherwise unremarkable. Labs with elevated LFT's, otherwise unremarkable. Repeat LFT's in 6 weeks, follow up in 1 year.

## 2017-10-20 ENCOUNTER — Encounter: Payer: Self-pay | Admitting: Primary Care

## 2017-10-20 ENCOUNTER — Ambulatory Visit (INDEPENDENT_AMBULATORY_CARE_PROVIDER_SITE_OTHER): Payer: 59 | Admitting: Primary Care

## 2017-10-20 VITALS — BP 118/76 | HR 73 | Temp 98.0°F | Ht 72.0 in | Wt 178.8 lb

## 2017-10-20 DIAGNOSIS — R945 Abnormal results of liver function studies: Secondary | ICD-10-CM

## 2017-10-20 DIAGNOSIS — R7989 Other specified abnormal findings of blood chemistry: Secondary | ICD-10-CM

## 2017-10-20 DIAGNOSIS — J449 Chronic obstructive pulmonary disease, unspecified: Secondary | ICD-10-CM | POA: Diagnosis not present

## 2017-10-20 LAB — HEPATIC FUNCTION PANEL
ALT: 42 U/L (ref 0–53)
AST: 38 U/L — ABNORMAL HIGH (ref 0–37)
Albumin: 4.2 g/dL (ref 3.5–5.2)
Alkaline Phosphatase: 72 U/L (ref 39–117)
BILIRUBIN TOTAL: 0.4 mg/dL (ref 0.2–1.2)
Bilirubin, Direct: 0.1 mg/dL (ref 0.0–0.3)
TOTAL PROTEIN: 7.1 g/dL (ref 6.0–8.3)

## 2017-10-20 MED ORDER — FLUTICASONE-SALMETEROL 250-50 MCG/DOSE IN AEPB: 1.0000 | INHALATION_SPRAY | Freq: Two times a day (BID) | RESPIRATORY_TRACT | 1 refills | Status: DC

## 2017-10-20 NOTE — Progress Notes (Signed)
Subjective:    Patient ID: Kurt [name]., male    DOB: [birth year], 51 y.o.   MRN: [id number]  HPI  Mr. Kurt [name] is a 51 year old male who presents today for follow up.  1) Cough: Last visit he endorsed a dry cough that has been present for the prior 2-3 weeks. He had no other symptoms, had been off of ACE for 1 year, and had an unremarkable CT chest for lung cancer screening. His cough was presumed to be secondary to COPD so he was provided with an albuterol inhaler.  Since his last visit he's noticed temporary improvement with his albuterol inhaler and is using his albuterol inhaler 2-3 times daily. He denies esopahgeal burning, rhinorrhea, post nasal drip.  2) Elevated LFT's: Noted on last visit with ALT 149 and AST of 87. He is currently managed on aspirin and atorvastatin for which he's taken for years. CMP from 2017 with ALT of 21 and AST of 26. He denies recent alcohol use, tylenol use, abdominal pain, unexplained weight loss.   Review of Systems  Constitutional: Negative for chills, fatigue and fever.  HENT: Negative for postnasal drip and rhinorrhea.   Respiratory: Positive for cough. Negative for shortness of breath and wheezing.   Cardiovascular: Negative for chest pain.  Gastrointestinal:       Denies GERD symptoms       Past Medical History:  Diagnosis Date  . Anginal pain ([place])   . Anxiety   . Avascular necrosis of bone of left hip ([place]) 03/24/2016  . Avascular necrosis of bone of right hip ([place]) 01/14/2016  . Coronary artery disease   . Coronary disease 09/12/2015  . Depression   . History of MI (myocardial infarction) 09/12/2015  . Hyperlipidemia   . Hypertension   . Myocardial infarction ([place])   . 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([place])   . Thyroid disease    reports that he has been low in the past but since he has stoped ETOH it has been normal     Social History   Social History  . Marital status: Legally Separated     Spouse name: N/A  . Number of children: N/A  . Years of education: N/A   Occupational History  . Not on file.   Social History Main Topics  . Smoking status: Former Smoker    Packs/day: 1.50    Years: 20.00    Types: Cigarettes    Quit date: 11/25/2015  . Smokeless tobacco: Never Used  . Alcohol use No     Comment: 4 months-recovering alcoholic per pt  . Drug use: No  . Sexual activity: Not on file   Other Topics Concern  . Not on file   Social History Narrative   Separated.   Works as a Optician, dispensing.   Enjoys fishing, Proofreader, golfing.       Past Surgical History:  Procedure Laterality Date  . CARDIAC CATHETERIZATION     many years ago in 1990's  . CORONARY STENT PLACEMENT    . TONSILLECTOMY     as a child  . TOTAL HIP ARTHROPLASTY Right 01/14/2016   Procedure: TOTAL HIP ARTHROPLASTY;  Surgeon: [name], MD;  Location: [facility];  Service: Orthopedics;  Laterality: Right;  . TOTAL HIP ARTHROPLASTY Left 03/24/2016   Procedure: TOTAL HIP ARTHROPLASTY;  Surgeon: [name], MD;  Location: [facility];  Service: Orthopedics;  Laterality: Left;    Family History  Problem Relation Age of Onset  . Cancer Mother   . Dementia Father   . Cancer Maternal Grandmother   . Cancer Maternal Grandfather   . Cancer Paternal Grandmother   . Cancer Paternal Grandfather     Allergies  Allergen Reactions  . Peanut-Containing Drug Products Anaphylaxis  . Erythromycin Other (See Comments)    Unknown childhood allergy    Current Outpatient Prescriptions on File Prior to Visit  Medication Sig Dispense Refill  . albuterol (PROVENTIL HFA;VENTOLIN HFA) 108 (90 Base) MCG/ACT inhaler Inhale 2 puffs into the lungs every 4 (four) hours as needed for wheezing (cough). 1 Inhaler 0  . atorvastatin (LIPITOR) 20 MG tablet TAKE 1 TABLET BY MOUTH ONCE DAILY AT 6 IN THE EVENING 30 tablet 0  . buPROPion (WELLBUTRIN XL) 300 MG 24 hr tablet TAKE ONE TABLET BY MOUTH IN THE MORNING 90  tablet 1  . carvedilol (COREG) 3.125 MG tablet Take 1 tablet (3.125 mg total) by mouth daily. 90 tablet 1  . Multiple Vitamin (MULTIVITAMIN WITH MINERALS) TABS tablet Take 1 tablet by mouth daily.     No current facility-administered medications on file prior to visit.     BP 118/76   Pulse 73   Temp 98 F (36.7 C) (Oral)   Ht 6' (1.829 m)   Wt 178 lb 12.8 oz (81.1 kg)   SpO2 96%   BMI 24.25 kg/m    Objective:   Physical Exam  Constitutional: He appears well-nourished.  HENT:  Nose: No mucosal edema.  Mouth/Throat: Oropharynx is clear and moist.  Neck: Neck supple.  Cardiovascular: Normal rate and regular rhythm.   Pulmonary/Chest: Effort normal and breath sounds normal. He has no wheezes. He has no rales.  Skin: Skin is warm and dry.          Assessment & Plan:

## 2017-10-20 NOTE — Patient Instructions (Signed)
Start fluticasone-salmetrol (Advair) inhaler for cough. Inhale one puff into the lungs twice daily, everyday.  Use the albuterol inhaler only as needed for breakthrough cough, shortness of breath. Please notify me if you require this more than three times weekly.  Complete lab work prior to leaving today. I will notify you of your results once received.   Please update me in 2 weeks.

## 2017-10-20 NOTE — Assessment & Plan Note (Signed)
Recheck LFT's today. Consider [place] of liver if levels remain high.  Would prefer he remain on his statin and ASA given history of MI. Will await lab results.

## 2017-10-20 NOTE — Assessment & Plan Note (Signed)
Temporary improvement in cough with albuterol, however, requiring use 2-3 times daily. Rx for Advair sent to pharmacy for daily use. Discussed to use albuterol only as needed and to notify if he requires use more than three times weekly. Discussed to rinse mouth after Advair use. He will update in 2 weeks.

## 2017-10-21 ENCOUNTER — Encounter: Payer: Self-pay | Admitting: *Deleted

## 2017-11-03 ENCOUNTER — Other Ambulatory Visit: Payer: Self-pay | Admitting: Primary Care

## 2017-11-05 ENCOUNTER — Encounter: Payer: Self-pay | Admitting: Primary Care

## 2017-11-09 ENCOUNTER — Other Ambulatory Visit: Payer: 59

## 2017-12-11 ENCOUNTER — Other Ambulatory Visit: Payer: Self-pay | Admitting: Primary Care

## 2017-12-11 DIAGNOSIS — E785 Hyperlipidemia, unspecified: Secondary | ICD-10-CM

## 2017-12-31 ENCOUNTER — Other Ambulatory Visit: Payer: Self-pay | Admitting: Primary Care

## 2017-12-31 DIAGNOSIS — I252 Old myocardial infarction: Secondary | ICD-10-CM

## 2017-12-31 DIAGNOSIS — I10 Essential (primary) hypertension: Secondary | ICD-10-CM

## 2017-12-31 DIAGNOSIS — E785 Hyperlipidemia, unspecified: Secondary | ICD-10-CM

## 2017-12-31 DIAGNOSIS — I2510 Atherosclerotic heart disease of native coronary artery without angina pectoris: Secondary | ICD-10-CM

## 2017-12-31 MED ORDER — ATORVASTATIN CALCIUM 20 MG PO TABS: ORAL_TABLET | ORAL | 2 refills | Status: DC

## 2017-12-31 NOTE — Telephone Encounter (Signed)
Copied from [place] [id number]. Topic: Quick Communication - See Telephone Encounter >> Dec 31, 2017  2:59 PM Bea Graff, NT wrote: CRM for notification. See Telephone encounter for: Pt needs refill of Lipitor-[facility] states they never received it and a refill of carvedilol (COREG). [facility] [address]  12/31/17.

## 2017-12-31 NOTE — Telephone Encounter (Signed)
I spoke with pt and [facility] [address] told pt they did not receive the lipitor so pt requesting refill on that. Pt last got carvedilol # 90 x 1 on 05/23/16. On 10/04/17 annual exam it appeared pt was taking med. Pt said he is not at home and is not sure if he is taking carvedilol or coreg or not. Pt said to send to Gentry Fitz NP and if she wants pt to take the carvedilol send to [facility] [address].

## 2017-12-31 NOTE — Telephone Encounter (Signed)
He really needs to follow up with his cardiologist, please have him do so. He needs a repeat echocardiogram which can show [place] his heart function. If he's unwilling to go back, let me know. Yes, we was supposed to be taking carvedilol due to decreased heart function in 2016, please have him confirm if he's been taking or not.  Refill for atorvastatin sent to pharmacy. Will await response.

## 2017-12-31 NOTE — Telephone Encounter (Signed)
Patient called in for refill of Lipitor, he says [facility] never got the prescription. He also wants Coreg refilled, last OV 10/04/17, last prescription dated 04/2016 with 1 refill.

## 2018-01-05 NOTE — Telephone Encounter (Signed)
Patient notified as instructed by telephone and verbalized understanding. Patient stated that he is willing to go to the cardiologist. Patient stated that he does not remember the name of the cardiologist that he saw. Patient stated that he has not taken Carvedilol for a long time, but is okay starting back on it. Patient stated script can be sent to [facility].

## 2018-01-06 MED ORDER — CARVEDILOL 3.125 MG PO TABS: 3.1250 mg | ORAL_TABLET | Freq: Two times a day (BID) | ORAL | 0 refills | Status: DC

## 2018-01-06 NOTE — Telephone Encounter (Signed)
Noted referral placed for cardiology, Rx sent to [facility].

## 2018-01-10 NOTE — Telephone Encounter (Signed)
Patient wants someone to call him regarding his referral. [phone]

## 2018-01-10 NOTE — Telephone Encounter (Signed)
I spoke with pt and gave him info about appt with Dr Rockey Situ at [facility] office on 01/13/18 at 1:40 pm. Pt voiced understanding.

## 2018-01-11 NOTE — Progress Notes (Signed)
Cardiology Office Note  Date:  01/13/2018   ID:  Kurt [name]., DOB [birth year], MRN [id number]  PCP:  Pleas Koch, NP   Chief Complaint  Patient presents with  . Other    Patient last seen in 2016. Patient denies chest pain and SOB. Meds reviewed verbally with patient.     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  In follow-up today he reports having some wheezing and congestion at nighttime Denies having significant problems with breathing during the daytime Attributes much of his symptoms to continued smoking Smoking has been on and off  Previous ran out of his medications, recently restarted on carvedilol 3.125 mg twice daily He is not taking lisinopril Unclear if he is still drinking  Lab reviewed personally with him September elevation of LFTs, improved 1 month later October 2018 Total cholesterol 180s  EKG personally reviewed by myself on todays visit  shows normal sinus rhythm rate 74 bpm no significant ST or T wave changes  Other past medical history reviewed Ejection fraction by echocardiogram 2009 was 55-60% per the notes     PMH:   has a past medical history of Anginal pain ([place]), Anxiety, Avascular necrosis of bone of left hip ([place]) (03/24/2016), Avascular necrosis of bone of right hip ([place]) (01/14/2016), Coronary artery disease, Coronary disease (09/12/2015), Depression, History of MI (myocardial infarction) (09/12/2015), Hyperlipidemia, Hypertension, Myocardial infarction 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  PSH:    Past Surgical History:  Procedure Laterality Date  . CARDIAC CATHETERIZATION     many years ago in 1990's  . CORONARY STENT PLACEMENT    . TONSILLECTOMY     as a child  . TOTAL HIP ARTHROPLASTY Right 01/14/2016   Procedure: TOTAL HIP ARTHROPLASTY;  Surgeon: [name], MD;  Location: [facility];  Service: Orthopedics;  Laterality: Right;  . TOTAL HIP ARTHROPLASTY Left 03/24/2016   Procedure: TOTAL HIP ARTHROPLASTY;  Surgeon: [name], MD;  Location: [facility];  Service: Orthopedics;  Laterality: Left;    Current Outpatient Medications  Medication Sig Dispense Refill  . albuterol (PROVENTIL HFA;VENTOLIN HFA) 108 (90 Base) MCG/ACT inhaler Inhale 2 puffs into the lungs every 4 (four) hours as needed for wheezing (cough). 1 Inhaler 0  . aspirin 325 MG tablet Take 325 mg by mouth daily.    Marland Kitchen atorvastatin (LIPITOR) 20 MG tablet Take 1 tablet by mouth every evening. 90 tablet 2  . buPROPion (WELLBUTRIN XL) 300 MG 24 hr tablet TAKE ONE TABLET BY MOUTH IN THE MORNING 90 tablet 1  . carvedilol (COREG) 6.25 MG tablet Take 1 tablet (6.25 mg total) by mouth 2 (two) times daily with a meal. 180 tablet 3  . Fluticasone-Salmeterol (ADVAIR DISKUS) 250-50 MCG/DOSE AEPB Inhale 1 puff into the lungs 2 (two) times daily. 1 each 1  . Multiple Vitamin (MULTIVITAMIN WITH MINERALS) TABS tablet Take 1 tablet by mouth daily.    . Omega-3 Fatty Acids (FISH OIL) 1000 MG CAPS Take by mouth.    Marland Kitchen lisinopril (PRINIVIL,ZESTRIL) 10 MG tablet Take 1  tablet (10 mg total) by mouth daily. 90 tablet 3   No current facility-administered medications for this visit.      Allergies:   Peanut-containing drug products and Erythromycin   Social History:  The patient  reports that he quit smoking about 2 years ago. His smoking use included cigarettes. He has a 30.00 pack-year smoking history. he has never used smokeless tobacco. He reports  that he does not drink alcohol or use drugs.   Family History:   family history includes Cancer in his maternal grandfather, maternal grandmother, mother, paternal grandfather, and paternal grandmother; Dementia in his father.    Review of Systems: Review of Systems  Constitutional: Negative.   Respiratory: Positive for cough.   Cardiovascular: Negative.   Gastrointestinal: Negative.   Musculoskeletal: Negative.   Neurological: Negative.   Psychiatric/Behavioral: Negative.   All other systems reviewed and are negative.    PHYSICAL EXAM: VS:  BP (!) 150/82 (BP Location: Left Arm, Patient Position: Sitting, Cuff Size: Normal)   Pulse 74   Ht 6' (1.829 m)   Wt 173 lb (78.5 kg)   BMI 23.46 kg/m  , BMI Body mass index is 23.46 kg/m. GEN: Well nourished, well developed, in no acute distress  HEENT: normal  Neck: no JVD, carotid bruits, or masses Cardiac: RRR; no murmurs, rubs, or gallops,no edema  Respiratory: Moderately decreased breath sounds throughout, normal work of breathing GI: soft, nontender, nondistended, + BS MS: no deformity or atrophy  Skin: warm and dry, no rash Neuro:  Strength and sensation are intact Psych: euthymic mood, full affect    Recent Labs: 09/24/2017: BUN 14; Creatinine, Ser 0.87; Potassium 4.2; Sodium 135; TSH 2.06 10/20/2017: ALT 42    Lipid Panel Lab Results  Component Value Date   CHOL 186 09/24/2017   HDL 77.60 09/24/2017   LDLCALC 97 09/24/2017   TRIG 56.0 09/24/2017      Wt Readings from Last 3 Encounters:  01/13/18 173 lb (78.5 kg)  10/20/17 178 lb 12.8 oz (81.1 kg)  10/04/17 176 lb 1.9 oz (79.9 kg)       ASSESSMENT AND PLAN:  Alcoholic cardiomyopathy ([place]) Not interested in echocardiogram at this time No clear signs of heart failure but does have cough at nighttime with wheezing Recommended he monitor symptoms for now, restart his medications We will increase carvedilol up to 6.25 mg twice daily, start lisinopril 10 mg  daily  Arteriosclerosis of coronary artery - Plan: EKG 12-Lead Stressed importance of taking aspirin, Lipitor  Benign essential HTN - Plan: EKG 12-Lead Medication changes as above  Mixed hyperlipidemia - Plan: EKG 12-Lead Suggested he continue on Lipitor 20  Alcoholism ([place]) Stressed importance of alcohol cessation and effect on the heart   Total encounter time more than 25 minutes  Greater than 50% was spent in counseling and coordination of care with the patient   Disposition:   F/U  12 months   Orders Placed This Encounter  Procedures  . EKG 12-Lead     Signed, Esmond Plants, M.D., Ph.D. 01/13/2018  [place], [place]

## 2018-01-13 ENCOUNTER — Encounter: Payer: Self-pay | Admitting: Cardiovascular Disease

## 2018-01-13 ENCOUNTER — Ambulatory Visit (INDEPENDENT_AMBULATORY_CARE_PROVIDER_SITE_OTHER): Payer: 59 | Admitting: Cardiovascular Disease

## 2018-01-13 VITALS — BP 150/82 | HR 74 | Ht 72.0 in | Wt 173.0 lb

## 2018-01-13 DIAGNOSIS — Z91199 Patient's noncompliance with other medical treatment and regimen due to unspecified reason: Secondary | ICD-10-CM

## 2018-01-13 DIAGNOSIS — E782 Mixed hyperlipidemia: Secondary | ICD-10-CM

## 2018-01-13 DIAGNOSIS — I2510 Atherosclerotic heart disease of native coronary artery without angina pectoris: Secondary | ICD-10-CM | POA: Diagnosis not present

## 2018-01-13 DIAGNOSIS — I252 Old myocardial infarction: Secondary | ICD-10-CM

## 2018-01-13 DIAGNOSIS — F1020 Alcohol dependence, uncomplicated: Secondary | ICD-10-CM | POA: Diagnosis not present

## 2018-01-13 DIAGNOSIS — I426 Alcoholic cardiomyopathy: Secondary | ICD-10-CM | POA: Diagnosis not present

## 2018-01-13 DIAGNOSIS — I10 Essential (primary) hypertension: Secondary | ICD-10-CM | POA: Diagnosis not present

## 2018-01-13 DIAGNOSIS — Z9119 Patient's noncompliance with other medical treatment and regimen: Secondary | ICD-10-CM | POA: Diagnosis not present

## 2018-01-13 MED ORDER — LISINOPRIL 10 MG PO TABS: 10.0000 mg | ORAL_TABLET | Freq: Every day | ORAL | 3 refills | Status: DC

## 2018-01-13 MED ORDER — CARVEDILOL 6.25 MG PO TABS: 3.1250 mg | ORAL_TABLET | Freq: Two times a day (BID) | ORAL | 3 refills | Status: DC

## 2018-01-13 MED ORDER — CARVEDILOL 6.25 MG PO TABS: 6.2500 mg | ORAL_TABLET | Freq: Two times a day (BID) | ORAL | 3 refills | Status: DC

## 2018-01-13 NOTE — Patient Instructions (Signed)
Medication Instructions:   Please increase the coreg up to 6.25 mg twice a day Start the lisinopril once a day   Labwork:  No new labs needed  Testing/Procedures:  No further testing at this time   Follow-Up: It was a pleasure seeing you in the office today. Please call us if you have new issues that need to be addressed before your next appt.  [phone]  Your physician wants you to follow-up in: 12 months.  You will receive a reminder letter in the mail two months in advance. If you don't receive a letter, please call our office to schedule the follow-up appointment.  If you need a refill on your cardiac medications before your next appointment, please call your pharmacy.

## 2018-01-30 ENCOUNTER — Other Ambulatory Visit: Payer: Self-pay | Admitting: Primary Care

## 2018-01-30 DIAGNOSIS — F411 Generalized anxiety disorder: Secondary | ICD-10-CM

## 2018-02-22 ENCOUNTER — Ambulatory Visit: Payer: 59 | Admitting: Internal Medicine

## 2018-02-23 ENCOUNTER — Telehealth: Payer: Self-pay | Admitting: Primary Care

## 2018-02-23 NOTE — Telephone Encounter (Signed)
Copied from [place]. Topic: Quick Communication - Rx Refill/Question >> Feb 23, 2018  9:37 AM Bea Graff, NT wrote: Medication:  albuterol (PROVENTIL HFA;VENTOLIN HFA)   Has the patient contacted their pharmacy? Yes.     (Agent: If no, request that the patient contact the pharmacy for the refill.)   Preferred Pharmacy (with phone number or street name): [facility] [address]   Agent: Please be advised that RX refills may take up to 3 business days. We ask that you follow-up with your pharmacy.

## 2018-02-24 ENCOUNTER — Other Ambulatory Visit: Payer: Self-pay

## 2018-02-24 DIAGNOSIS — R05 Cough: Secondary | ICD-10-CM

## 2018-02-24 DIAGNOSIS — R059 Cough, unspecified: Secondary | ICD-10-CM

## 2018-02-24 MED ORDER — ALBUTEROL SULFATE HFA 108 (90 BASE) MCG/ACT IN AERS: 2.0000 | INHALATION_SPRAY | RESPIRATORY_TRACT | 0 refills | Status: DC | PRN

## 2018-03-26 DIAGNOSIS — Z79899 Other long term (current) drug therapy: Secondary | ICD-10-CM | POA: Insufficient documentation

## 2018-03-26 DIAGNOSIS — E039 Hypothyroidism, unspecified: Secondary | ICD-10-CM | POA: Insufficient documentation

## 2018-03-26 DIAGNOSIS — I2510 Atherosclerotic heart disease of native coronary artery without angina pectoris: Secondary | ICD-10-CM | POA: Diagnosis not present

## 2018-03-26 DIAGNOSIS — I10 Essential (primary) hypertension: Secondary | ICD-10-CM | POA: Diagnosis not present

## 2018-03-26 DIAGNOSIS — J449 Chronic obstructive pulmonary disease, unspecified: Secondary | ICD-10-CM | POA: Diagnosis not present

## 2018-03-26 DIAGNOSIS — R0789 Other chest pain: Secondary | ICD-10-CM | POA: Insufficient documentation

## 2018-03-26 DIAGNOSIS — Z7982 Long term (current) use of aspirin: Secondary | ICD-10-CM | POA: Diagnosis not present

## 2018-03-26 DIAGNOSIS — Z87891 Personal history of nicotine dependence: Secondary | ICD-10-CM | POA: Insufficient documentation

## 2018-03-26 DIAGNOSIS — Z96643 Presence of artificial hip joint, bilateral: Secondary | ICD-10-CM | POA: Diagnosis not present

## 2018-03-27 ENCOUNTER — Emergency Department: Payer: 59

## 2018-03-27 ENCOUNTER — Other Ambulatory Visit: Payer: Self-pay

## 2018-03-27 ENCOUNTER — Emergency Department
Admission: EM | Admit: 2018-03-27 | Discharge: 2018-03-27 | Disposition: A | Discharge status: Home / Self Care | Payer: 59 | Attending: Emergency Medicine | Admitting: Emergency Medicine

## 2018-03-27 DIAGNOSIS — R0789 Other chest pain: Secondary | ICD-10-CM

## 2018-03-27 LAB — CBC
HCT: 37.9 % — ABNORMAL LOW (ref 40.0–52.0)
Hemoglobin: 13.3 g/dL (ref 13.0–18.0)
MCH: 34.6 pg — AB (ref 26.0–34.0)
MCHC: 35.2 g/dL (ref 32.0–36.0)
MCV: 98.4 fL (ref 80.0–100.0)
PLATELETS: 228 10*3/uL (ref 150–440)
RBC: 3.85 MIL/uL — ABNORMAL LOW (ref 4.40–5.90)
RDW: 13.2 % (ref 11.5–14.5)
WBC: 7.2 10*3/uL (ref 3.8–10.6)

## 2018-03-27 LAB — BASIC METABOLIC PANEL
Anion gap: 12 (ref 5–15)
BUN: 18 mg/dL (ref 6–20)
CHLORIDE: 102 mmol/L (ref 101–111)
CO2: 22 mmol/L (ref 22–32)
CREATININE: 1.03 mg/dL (ref 0.61–1.24)
Calcium: 8.6 mg/dL — ABNORMAL LOW (ref 8.9–10.3)
GFR calc Af Amer: >60 mL/min (ref >60)
GFR calc non Af Amer: >60 mL/min (ref >60)
GLUCOSE: 92 mg/dL (ref 65–99)
Potassium: 3.7 mmol/L (ref 3.5–5.1)
SODIUM: 136 mmol/L (ref 135–145)

## 2018-03-27 LAB — TROPONIN I
Troponin I: <0.03 ng/mL (ref <0.03)
Troponin I: <0.03 ng/mL (ref <0.03)

## 2018-03-27 MED ORDER — NITROGLYCERIN 0.4 MG SL SUBL: 0.4000 mg | SUBLINGUAL_TABLET | SUBLINGUAL | 0 refills | Status: DC | PRN

## 2018-03-27 NOTE — ED Triage Notes (Signed)
Patient reports left sided chest pain, non radiating for approximately.

## 2018-03-27 NOTE — ED Provider Notes (Signed)
[facility] Emergency Department Provider Note  ____________________________________________   First MD Initiated Contact with Patient 03/27/18 [phone]     (approximate)  I have reviewed the triage vital signs and the nursing notes.   HISTORY  Chief Complaint Chest Pain   HPI Kurt [name]. is a 52 y.o. male who self presents to the emergency department with a 25-minute episode of chest pain that began this evening when he laid down to go to bed.  The pain began rather suddenly in his left lateral chest.  Nonradiating.  Associated with some nausea but no vomiting.  Nonexertional.  No shortness of breath.  He sat up and his wife gave him Mylanta because he began to burp and this nearly completely resolved his symptoms.  He does have a past medical history of coronary artery disease and has a stent.  He takes aspirin daily.  The pain was not ripping or tearing and does not go straight to his back.  He has no leg swelling.  History of DVT or pulmonary embolism.  Past Medical History:  Diagnosis Date  . Anginal pain ([place])   . Anxiety   . Avascular necrosis of bone of left hip ([place]) 03/24/2016  . Avascular necrosis of bone of right hip ([place]) 01/14/2016  . Coronary artery disease   . Coronary disease 09/12/2015  . Depression   . History of MI (myocardial infarction) 09/12/2015  . Hyperlipidemia   . Hypertension   . Myocardial infarction ([place])   . 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([place])   . Thyroid disease    reports that he has been low in the past but since he has stoped ETOH it has been normal    Patient Active Problem List   Diagnosis Date Noted  . Elevated LFTs 10/04/2017  . COPD (chronic obstructive pulmonary disease) ([place]) 10/04/2017  . Nocturia 10/04/2017  . Malnutrition of moderate degree 03/28/2016  . Postoperative anemia due to acute blood loss 03/27/2016  . Avascular necrosis of bone of left hip ([place]) 03/24/2016    . 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  . Avascular necrosis of bone of right hip ([place]) 01/14/2016  . Avascular necrosis of hip ([place]) 01/14/2016  . 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 Unstable angina ([place]) 11/25/2015  . Arteriosclerosis of coronary artery 10/20/2015  . HLD (hyperlipidemia) 10/20/2015  . Alcoholism ([place]) 10/20/2015  . History of MI (myocardial infarction) 09/12/2015  . Anxiety and depression 07/31/2015  . Testosterone deficiency 07/31/2015  . Preventative health care 07/31/2015  . Benign essential HTN 06/28/2014  . Acquired hypothyroidism 04/06/2013  . General patient noncompliance 03/29/2013    Past Surgical History:  Procedure Laterality Date  . CARDIAC CATHETERIZATION     many years ago in 1990's  . CORONARY STENT PLACEMENT    . TONSILLECTOMY     as a child  . TOTAL HIP ARTHROPLASTY Right 01/14/2016   Procedure: TOTAL HIP ARTHROPLASTY;  Surgeon: [name], MD;  Location: [place];  Service: Orthopedics;  Laterality: Right;  . TOTAL HIP ARTHROPLASTY Left 03/24/2016   Procedure: TOTAL HIP ARTHROPLASTY;  Surgeon: [name], MD;  Location: [place];  Service: Orthopedics;  Laterality: Left;    Prior to Admission medications   Medication Sig Start Date End Date Taking? Authorizing Provider  albuterol (PROVENTIL HFA;VENTOLIN HFA) 108 (90 Base) MCG/ACT inhaler Inhale 2 puffs into the lungs every 4 (four) hours as needed for wheezing (cough). 02/24/18   Alma Friendly  K, NP  aspirin 325 MG tablet Take 325 mg by mouth daily.    [provider]  atorvastatin (LIPITOR) 20 MG tablet Take 1 tablet by mouth every evening. 12/31/17   Pleas Koch, NP  buPROPion (WELLBUTRIN XL) 300 MG 24 hr tablet TAKE 1 TABLET BY MOUTH IN THE MORNING 01/31/18   Pleas Koch, NP  carvedilol (COREG) 6.25 MG tablet Take 1 tablet (6.25 mg total) by mouth 2 (two) times daily with a meal. 01/13/18   Gollan, Kathlene November, MD  Fluticasone-Salmeterol (ADVAIR DISKUS) 250-50  MCG/DOSE AEPB Inhale 1 puff into the lungs 2 (two) times daily. 10/20/17   Pleas Koch, NP  lisinopril (PRINIVIL,ZESTRIL) 10 MG tablet Take 1 tablet (10 mg total) by mouth daily. 01/13/18   Minna Merritts, MD  Multiple Vitamin (MULTIVITAMIN WITH MINERALS) TABS tablet Take 1 tablet by mouth daily.    [provider]  nitroGLYCERIN (NITROSTAT) 0.4 MG SL tablet Place 1 tablet (0.4 mg total) under the tongue every 5 (five) minutes as needed for chest pain. 03/27/18 03/27/19  Darel Hong, MD  Omega-3 Fatty Acids (FISH OIL) 1000 MG CAPS Take by mouth.    [provider]    Allergies Peanut-containing drug products and Erythromycin  Family History  Problem Relation Age of Onset  . Cancer Mother   . Dementia Father   . Cancer Maternal Grandmother   . Cancer Maternal Grandfather   . Cancer Paternal Grandmother   . Cancer Paternal Grandfather     Social History Social History   Tobacco Use  . Smoking status: Former Smoker    Packs/day: 1.50    Years: 20.00    Pack years: 30.00    Types: Cigarettes    Last attempt to quit: 11/25/2015    Years since quitting: 2.3  . Smokeless tobacco: Never Used  Substance Use Topics  . Alcohol use: No    Alcohol/week: 0.0 oz    Comment: 4 months-recovering alcoholic per pt  . Drug use: No    Review of Systems Constitutional: No fever/chills Eyes: No visual changes. ENT: No sore throat. Cardiovascular: Positive for chest pain. Respiratory: Denies shortness of breath. Gastrointestinal: No abdominal pain.  Positive for nausea, no vomiting.  No diarrhea.  No constipation. Genitourinary: Negative for dysuria. Musculoskeletal: Negative for back pain. Skin: Negative for rash. Neurological: Negative for headaches, focal weakness or numbness.   ____________________________________________   PHYSICAL EXAM:  VITAL SIGNS: ED Triage Vitals  Enc Vitals Group     BP 03/27/18 0004 (!) 146/90     Pulse Rate 03/27/18 0004  74     Resp 03/27/18 0004 (!) 22     Temp 03/27/18 0004 98.7 F (37.1 C)     Temp Source 03/27/18 0004 Oral     SpO2 03/27/18 0004 96 %     Weight 03/27/18 0003 175 lb (79.4 kg)     Height 03/27/18 0003 6' (1.829 m)     Head Circumference --      Peak Flow --      Pain Score --      Pain Loc --      Pain Edu? --      Excl. in [place]? --     Constitutional: Alert and oriented x4 pleasant cooperative speaks in full clear sentences no diaphoresis Eyes: PERRL EOMI. Head: Atraumatic. Nose: No congestion/rhinnorhea. Mouth/Throat: No trismus Neck: No stridor.   Cardiovascular: Normal rate, regular rhythm. Grossly normal heart sounds.  Good peripheral  circulation. Respiratory: Normal respiratory effort.  No retractions. Lungs CTAB and moving good air Gastrointestinal: Soft nontender Musculoskeletal: No lower extremity edema   Neurologic:  Normal speech and language. No gross focal neurologic deficits are appreciated. Skin:  Skin is warm, dry and intact. No rash noted. Psychiatric: Mood and affect are normal. Speech and behavior are normal.    ____________________________________________   DIFFERENTIAL includes but not limited to  Acute coronary syndrome, pulmonary embolism, aortic dissection, gastritis ____________________________________________   LABS (all labs ordered are listed, but only abnormal results are displayed)  Labs Reviewed  BASIC METABOLIC PANEL - Abnormal; Notable for the following components:      Result Value   Calcium 8.6 (*)    All other components within normal limits  CBC - Abnormal; Notable for the following components:   RBC 3.85 (*)    HCT 37.9 (*)    MCH 34.6 (*)    All other components within normal limits  TROPONIN I  TROPONIN I    Lab work reviewed by me with no acute ischemia x2 __________________________________________  EKG  ED ECG REPORT I, Darel Hong, the attending physician, personally viewed and interpreted this ECG.  Date:  03/26/2018 EKG Time: 2357 Rate: 75 Rhythm: normal sinus rhythm QRS Axis: Rightward axis Intervals: normal ST/T Wave abnormalities: Tall T waves in 2 3 and aVF although roughly consistent with previous old EKG Narrative Interpretation: no evidence of acute ischemia  ED ECG REPORT I, Darel Hong, the attending physician, personally viewed and interpreted this ECG.  Date: 03/27/2018 EKG Time: 0022 Rate: 75 Rhythm: normal sinus rhythm QRS Axis: Rightward axis Intervals: normal ST/T Wave abnormalities: T waves are tall in 2 3 aVF but unchanged Narrative Interpretation: no evidence of acute ischemia  ____________________________________________  RADIOLOGY  Chest x-ray reviewed by me with chronic changes in a pulmonary nodule although no acute disease ____________________________________________   PROCEDURES  Procedure(s) performed: no  Procedures  Critical Care performed: no  Observation: no ____________________________________________   INITIAL IMPRESSION / ASSESSMENT AND PLAN / ED COURSE  Pertinent labs & imaging results that were available during my care of the patient were reviewed by me and considered in my medical decision making (see chart for details).  By the time I saw the patient he had already had 2 troponins 3 hours apart which were negative.  His history of burning left-sided nonradiating nonexertional chest pain with no shortness of breath which is associated with belching and improved with Mylanta is consistent with a gastric etiology.  I have encouraged the patient to continue taking over-the-counter medications and to follow-up with his primary care physician as needed.  Strict return precautions have been given and the patient verbalizes understanding and agree with the plan.  We discussed his pulmonary nodule and he understands to follow-up with his primary care physician.       FINAL CLINICAL IMPRESSION(S) / ED DIAGNOSES  Final diagnoses:    Atypical chest pain      NEW MEDICATIONS STARTED DURING THIS VISIT:  New Prescriptions   NITROGLYCERIN (NITROSTAT) 0.4 MG SL TABLET    Place 1 tablet (0.4 mg total) under the tongue every 5 (five) minutes as needed for chest pain.     Note:  This document was prepared using Dragon voice recognition software and may include unintentional dictation errors.     Darel Hong, MD 03/27/18 [phone]

## 2018-03-27 NOTE — Discharge Instructions (Signed)
Please make an appointment to follow-up with your primary care physician as needed and return to the emergency department for any concerns.  It was a pleasure to take care of you today, and thank you for coming to our emergency department.  If you have any questions or concerns before leaving please ask the nurse to grab me and I'm more than happy to go through your aftercare instructions again.  If you were prescribed any opioid pain medication today such as Norco, Vicodin, Percocet, morphine, hydrocodone, or oxycodone please make sure you do not drive when you are taking this medication as it can alter your ability to drive safely.  If you have any concerns once you are home that you are not improving or are in fact getting worse before you can make it to your follow-up appointment, please do not hesitate to call 911 and come back for further evaluation.  Darel Hong, MD  Results for orders placed or performed during the hospital encounter of 74/08/14  Basic metabolic panel  Result Value Ref Range   Sodium 136 135 - 145 mmol/L   Potassium 3.7 3.5 - 5.1 mmol/L   Chloride 102 101 - 111 mmol/L   CO2 22 22 - 32 mmol/L   Glucose, Bld 92 65 - 99 mg/dL   BUN 18 6 - 20 mg/dL   Creatinine, Ser 1.03 0.61 - 1.24 mg/dL   Calcium 8.6 (L) 8.9 - 10.3 mg/dL   GFR calc non Af Amer >60 >60 mL/min   GFR calc Af Amer >60 >60 mL/min   Anion gap 12 5 - 15  CBC  Result Value Ref Range   WBC 7.2 3.8 - 10.6 K/uL   RBC 3.85 (L) 4.40 - 5.90 MIL/uL   Hemoglobin 13.3 13.0 - 18.0 g/dL   HCT 37.9 (L) 40.0 - 52.0 %   MCV 98.4 80.0 - 100.0 fL   MCH 34.6 (H) 26.0 - 34.0 pg   MCHC 35.2 32.0 - 36.0 g/dL   RDW 13.2 11.5 - 14.5 %   Platelets 228 150 - 440 K/uL  Troponin I  Result Value Ref Range   Troponin I <0.03 <0.03 ng/mL  Troponin I  Result Value Ref Range   Troponin I <0.03 <0.03 ng/mL   Dg Chest 2 View  Result Date: 03/27/2018 CLINICAL DATA:  Non radiating LEFT chest pain. EXAM: CHEST - 2 VIEW  COMPARISON:  CT chest April 21, 2016 FINDINGS: Cardiomediastinal silhouette is normal. Redemonstration of LEFT upper lobe pulmonary nodule. Mild hyperinflation chronic interstitial changes. No pleural effusion or focal consolidation. No pneumothorax. Mild degenerative change of thoracic spine. IMPRESSION: Mild suspected COPD. Redemonstration of LEFT upper lobe pulmonary nodule. Electronically Signed   By: Elon Alas M.D.   On: 03/27/2018 01:04

## 2018-03-27 NOTE — ED Notes (Signed)
Sitting quietly with friend, no acute distress noted.

## 2018-08-14 ENCOUNTER — Other Ambulatory Visit: Payer: Self-pay | Admitting: Primary Care

## 2018-08-14 DIAGNOSIS — F411 Generalized anxiety disorder: Secondary | ICD-10-CM

## 2018-08-15 MED ORDER — BUPROPION HCL ER (XL) 300 MG PO TB24: 300.0000 mg | ORAL_TABLET | Freq: Every morning | ORAL | 0 refills | Status: DC

## 2018-08-15 NOTE — Addendum Note (Signed)
Addended by: Jacqualin Combes on: 08/15/2018 11:25 AM   Modules accepted: Orders

## 2018-08-19 ENCOUNTER — Other Ambulatory Visit: Payer: Self-pay | Admitting: Primary Care

## 2018-08-19 DIAGNOSIS — F411 Generalized anxiety disorder: Secondary | ICD-10-CM

## 2018-09-02 ENCOUNTER — Other Ambulatory Visit: Payer: Self-pay | Admitting: Primary Care

## 2018-09-02 DIAGNOSIS — R05 Cough: Secondary | ICD-10-CM

## 2018-09-02 DIAGNOSIS — J449 Chronic obstructive pulmonary disease, unspecified: Secondary | ICD-10-CM

## 2018-09-02 DIAGNOSIS — R059 Cough, unspecified: Secondary | ICD-10-CM

## 2018-10-05 ENCOUNTER — Encounter: Payer: Self-pay | Admitting: Primary Care

## 2018-10-05 ENCOUNTER — Ambulatory Visit (INDEPENDENT_AMBULATORY_CARE_PROVIDER_SITE_OTHER): Payer: 59 | Admitting: Primary Care

## 2018-10-05 VITALS — BP 146/92 | HR 79 | Temp 98.0°F | Ht 72.0 in | Wt 178.5 lb

## 2018-10-05 DIAGNOSIS — I10 Essential (primary) hypertension: Secondary | ICD-10-CM

## 2018-10-05 DIAGNOSIS — F32A Depression, unspecified: Secondary | ICD-10-CM

## 2018-10-05 DIAGNOSIS — L989 Disorder of the skin and subcutaneous tissue, unspecified: Secondary | ICD-10-CM

## 2018-10-05 DIAGNOSIS — F419 Anxiety disorder, unspecified: Secondary | ICD-10-CM | POA: Diagnosis not present

## 2018-10-05 DIAGNOSIS — F329 Major depressive disorder, single episode, unspecified: Secondary | ICD-10-CM

## 2018-10-05 MED ORDER — LISINOPRIL 20 MG PO TABS: ORAL_TABLET | ORAL | 0 refills | Status: DC

## 2018-10-05 MED ORDER — ESCITALOPRAM OXALATE 10 MG PO TABS: ORAL_TABLET | ORAL | 0 refills | Status: DC

## 2018-10-05 MED ORDER — BUPROPION HCL ER (XL) 150 MG PO TB24: ORAL_TABLET | ORAL | 0 refills | Status: DC

## 2018-10-05 NOTE — Assessment & Plan Note (Signed)
Above goal in the office today, also on prior visits and home readings. Increase lisinopril to 20 mg. Continue carvedilol BID.  Will have him monitor readings and follow up in 4 weeks for re-evaluation. He will call sooner if no improvement in 2 weeks. BMP next visit.

## 2018-10-05 NOTE — Assessment & Plan Note (Signed)
Deteriorated over the last one year. PHQ 8 score of 18 and GAD 7 score of 16 today.   Will decrease Wellbutrin XL to 150 daily, add in Lexapro 10 mg daily. He will follow up in 4 weeks for re-evaluation. Denies SI/HI.

## 2018-10-05 NOTE — Patient Instructions (Signed)
We've increased your dose of lisinopril to 20 mg. I sent a new prescription to the pharmacy. You may take two of the 10 mg tablets to equal 20 mg until your bottle is empty.  Start monitoring your blood pressure daily, around the same time of day, for the next several weeks.  Ensure that you have rested for 30 minutes prior to checking your blood pressure. Record your readings and bring them to your next visit.  We've reduced your bupropion ER 300 mg tablet for depression down to bupropion ER 150 mg. I sent a new prescription to the pharmacy.  Start escitalopram (Lexapro) 10 mg tablets for anxiety and depression. Take 1 tablet by mouth once daily.   You will be contacted regarding your referral to dermatology.  Please let us know if you have not been contacted within one week.   Please schedule a follow up appointment in 1 month for blood pressure and anxiety/depression check. Your blood pressure goal is 130/80 or below.   Please call me sooner if you experience any problems.   It was a pleasure to see you today!

## 2018-10-05 NOTE — Progress Notes (Signed)
Subjective:    Patient ID: Kurt Covello., male    DOB: August 28, 1966, 52 y.o.   MRN: [id number]  HPI  Kurt [name] is a 52 year old male who presents today with a chief complaint of skin lesion. He would also like to discuss his current medications.  BP Readings from Last 3 Encounters:  10/05/18 (!) 146/92  03/27/18 (!) 148/97  01/13/18 (!) 150/82    1) Skin Lesion: Present for the last one month and located to the right forehead. He is exposed to the sun for prolonged periods of time during the day, sometimes wears sunscreen. He's not noticed changes in size, shape, or color over the last one month. History of tobacco abuse.  2) Essential Hypertension: Currently managed on carvedilol 6.25 mg BID and lisinopril 10 mg. He's checking his BP at home and is getting readings of 135-140/80-90's, sometimes diastolic is 185'U. He endorses a headache/pressure most everyday for the last several weeks. He denies chest pain.   3) Depression and Anxiety: Currently managed on Wellbutrin XL 300 mg once daily. He's been taking this for the last 2-3 years and has noticed a deterioration of effectiveness. Symptoms include feeling fatigued, feeling down/depressed, little motivation to do anything, decrease in appetite, easily stressed, daily worry. GAD 7 score if 16 and PHQ 9 score of 18 today. He denies SI/HI.  Review of Systems  Respiratory: Negative for shortness of breath.   Cardiovascular: Negative for chest pain.  Skin:       Skin lesion  Neurological: Positive for headaches. Negative for dizziness.  Psychiatric/Behavioral:       See HPI       Past Medical History:  Diagnosis Date  . Anginal pain ([place])   . Anxiety   . Avascular necrosis of bone of left hip ([place]) 03/24/2016  . Avascular necrosis of bone of right hip ([place]) 01/14/2016  . Coronary artery disease   . Coronary disease 09/12/2015  . Depression   . History of MI (myocardial infarction) 09/12/2015  . Hyperlipidemia   .  Hypertension   . Myocardial infarction ([place])   . 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([place])   . Thyroid disease    reports that he has been low in the past but since he has stoped ETOH it has been normal     Social History   Socioeconomic History  . Marital status: Legally Separated    Spouse name: Not on file  . Number of children: Not on file  . Years of education: Not on file  . Highest education level: Not on file  Occupational History  . Not on file  Social Needs  . Financial resource strain: Not on file  . Food insecurity:    Worry: Not on file    Inability: Not on file  . Transportation needs:    Medical: Not on file    Non-medical: Not on file  Tobacco Use  . Smoking status: Former Smoker    Packs/day: 1.50    Years: 20.00    Pack years: 30.00    Types: Cigarettes    Last attempt to quit: 11/25/2015    Years since quitting: 2.8  . Smokeless tobacco: Never Used  Substance and Sexual Activity  . Alcohol use: No    Alcohol/week: 0.0 standard drinks    Comment: 4 months-recovering alcoholic per pt  . Drug use: No  . Sexual activity: Not on file  Lifestyle  . Physical  activity:    Days per week: Not on file    Minutes per session: Not on file  . Stress: Not on file  Relationships  . Social connections:    Talks on phone: Not on file    Gets together: Not on file    Attends religious service: Not on file    Active member of club or organization: Not on file    Attends meetings of clubs or organizations: Not on file    Relationship status: Not on file  . Intimate partner violence:    Fear of current or ex partner: Not on file    Emotionally abused: Not on file    Physically abused: Not on file    Forced sexual activity: Not on file  Other Topics Concern  . Not on file  Social History Narrative   Separated.   Works as a Optician, dispensing.   Enjoys fishing, Proofreader, golfing.    Past Surgical History:  Procedure  Laterality Date  . CARDIAC CATHETERIZATION     many years ago in 1990's  . CORONARY STENT PLACEMENT    . TONSILLECTOMY     as a child  . TOTAL HIP ARTHROPLASTY Right 01/14/2016   Procedure: TOTAL HIP ARTHROPLASTY;  Surgeon: [name], MD;  Location: [facility];  Service: Orthopedics;  Laterality: Right;  . TOTAL HIP ARTHROPLASTY Left 03/24/2016   Procedure: TOTAL HIP ARTHROPLASTY;  Surgeon: [name], MD;  Location: [facility];  Service: Orthopedics;  Laterality: Left;    Family History  Problem Relation Age of Onset  . Cancer Mother   . Dementia Father   . Cancer Maternal Grandmother   . Cancer Maternal Grandfather   . Cancer Paternal Grandmother   . Cancer Paternal Grandfather     Allergies  Allergen Reactions  . Peanut-Containing Drug Products Anaphylaxis  . Erythromycin Other (See Comments)    Unknown childhood allergy    Current Outpatient Medications on File Prior to Visit  Medication Sig Dispense Refill  . aspirin 325 MG tablet Take 325 mg by mouth daily.    Marland Kitchen atorvastatin (LIPITOR) 20 MG tablet Take 1 tablet by mouth every evening. 90 tablet 2  . carvedilol (COREG) 6.25 MG tablet Take 1 tablet (6.25 mg total) by mouth 2 (two) times daily with a meal. 180 tablet 3  . Fluticasone-Salmeterol (ADVAIR) 250-50 MCG/DOSE AEPB INHALE 1 PUFF INTO THE LUNGS  TWICE DAILY 60 each 1  . Multiple Vitamin (MULTIVITAMIN WITH MINERALS) TABS tablet Take 1 tablet by mouth daily.    . nitroGLYCERIN (NITROSTAT) 0.4 MG SL tablet Place 1 tablet (0.4 mg total) under the tongue every 5 (five) minutes as needed for chest pain. 100 tablet 0  . Omega-3 Fatty Acids (FISH OIL) 1000 MG CAPS Take by mouth.    . VENTOLIN HFA 108 (90 Base) MCG/ACT inhaler INHALE 2 PUFFS BY MOUTH EVERY 4 HOURS AS NEEDED FOR WHEEZING OR COUGH 18 each 0   No current facility-administered medications on file prior to visit.     BP (!) 146/92   Pulse 79   Temp 98 F (36.7 C) (Oral)   Ht 6' (1.829 m)   Wt 178 lb 8 oz (81  kg)   SpO2 98%   BMI 24.21 kg/m    Objective:   Physical Exam  Constitutional: He appears well-nourished.  Neck: Neck supple.  Cardiovascular: Normal rate and regular rhythm.  Respiratory: Effort normal and breath sounds normal.  Skin: Skin is warm and dry.  1 cm oval shaped lesion to right forehead with central hypopigmentation with surrounding light brown border. Flat. No erythema.   Psychiatric: He has a normal mood and affect.           Assessment & Plan:  Skin Lesion:  Noted to right forehead x 1 month. Unsure if suspicious but given tobacco abuse history and frequent exposure to the sun, will send to dermatology for evaluation.  Referral placed.  Pleas Koch, NP

## 2018-10-18 IMAGING — CR DG CHEST 2V
2 series · 2 of 2 positions shown · non-contrast
Comparison: CT chest April 21, 2016

CLINICAL DATA: Non radiating LEFT chest pain.

EXAM:
CHEST - 2 VIEW

[chest pa]
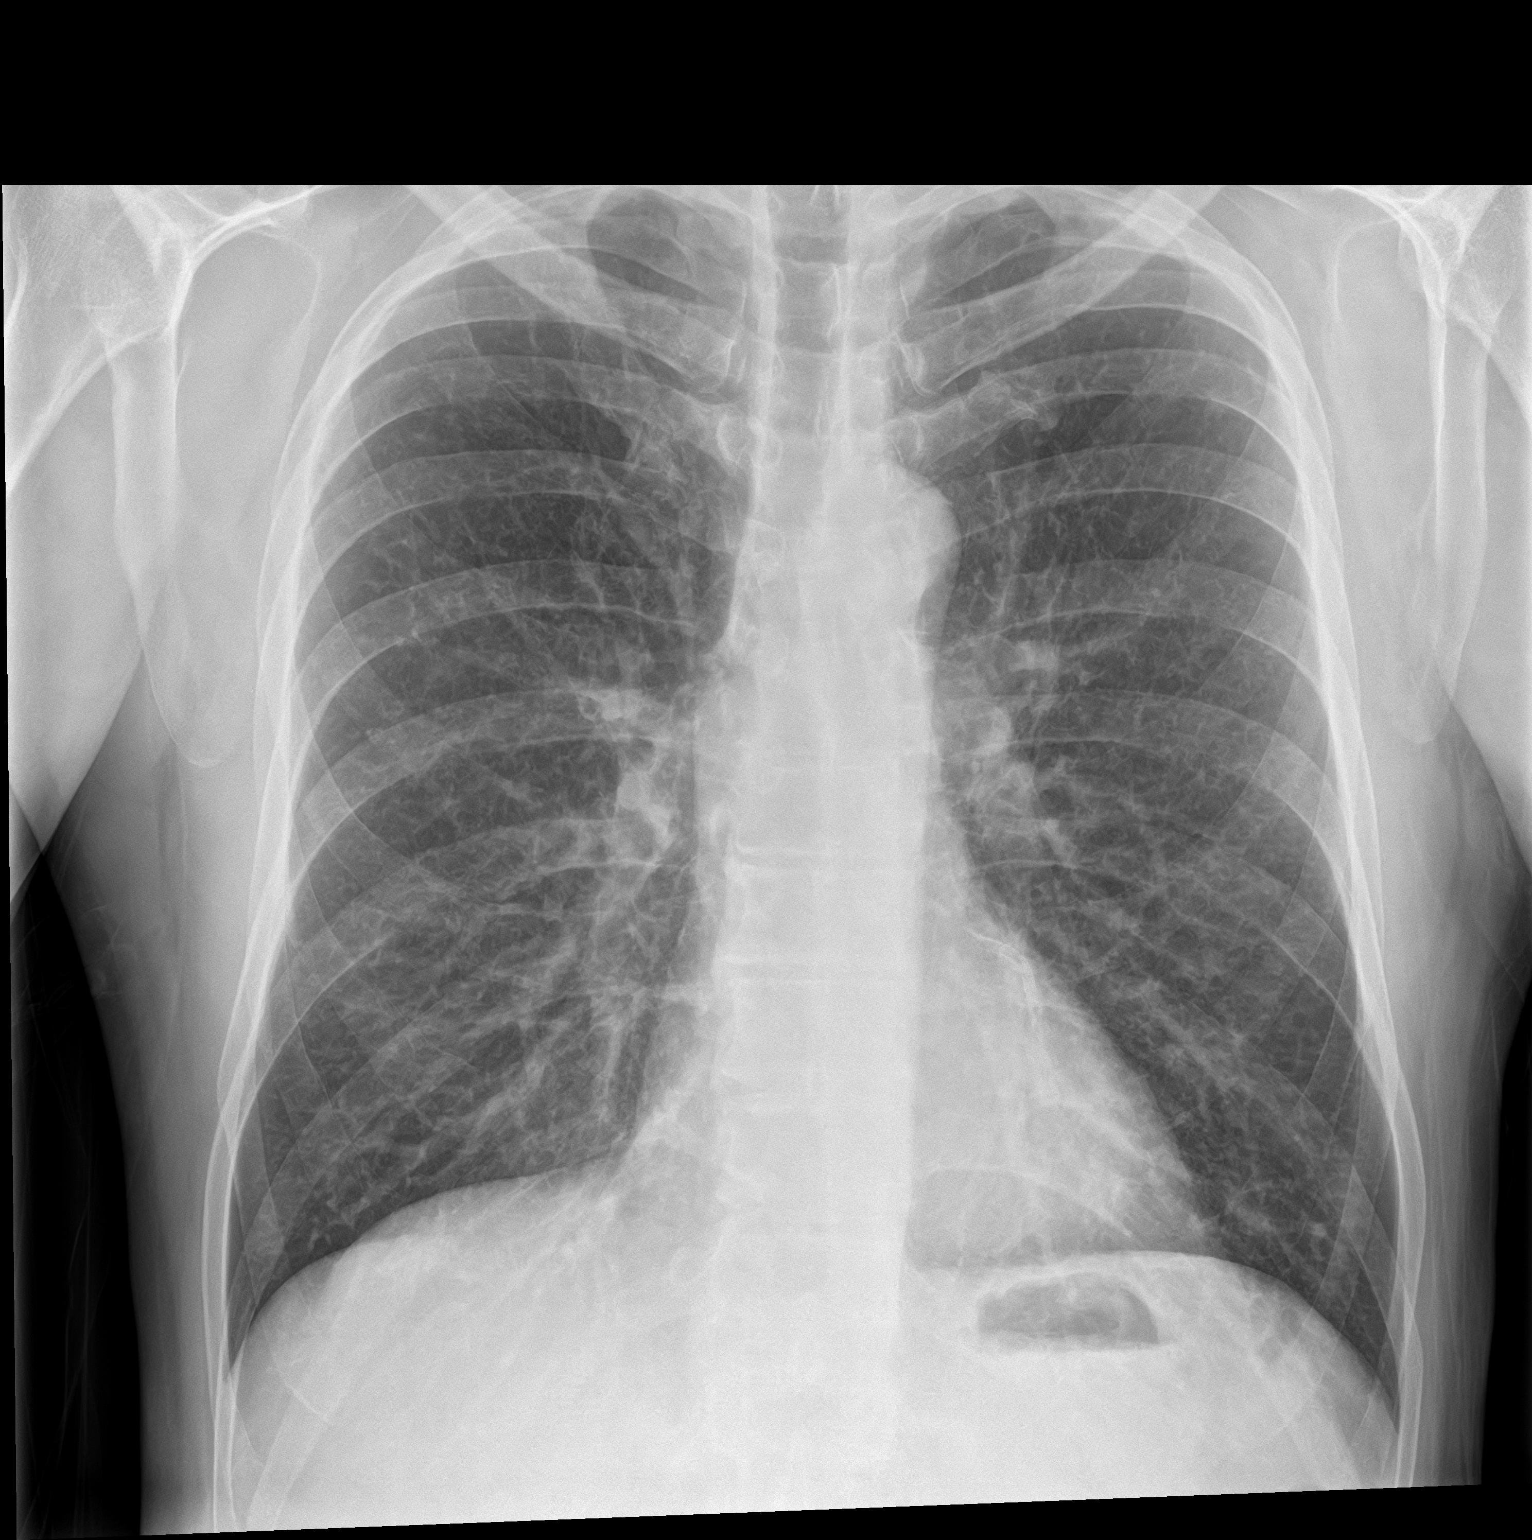

[chest lat]
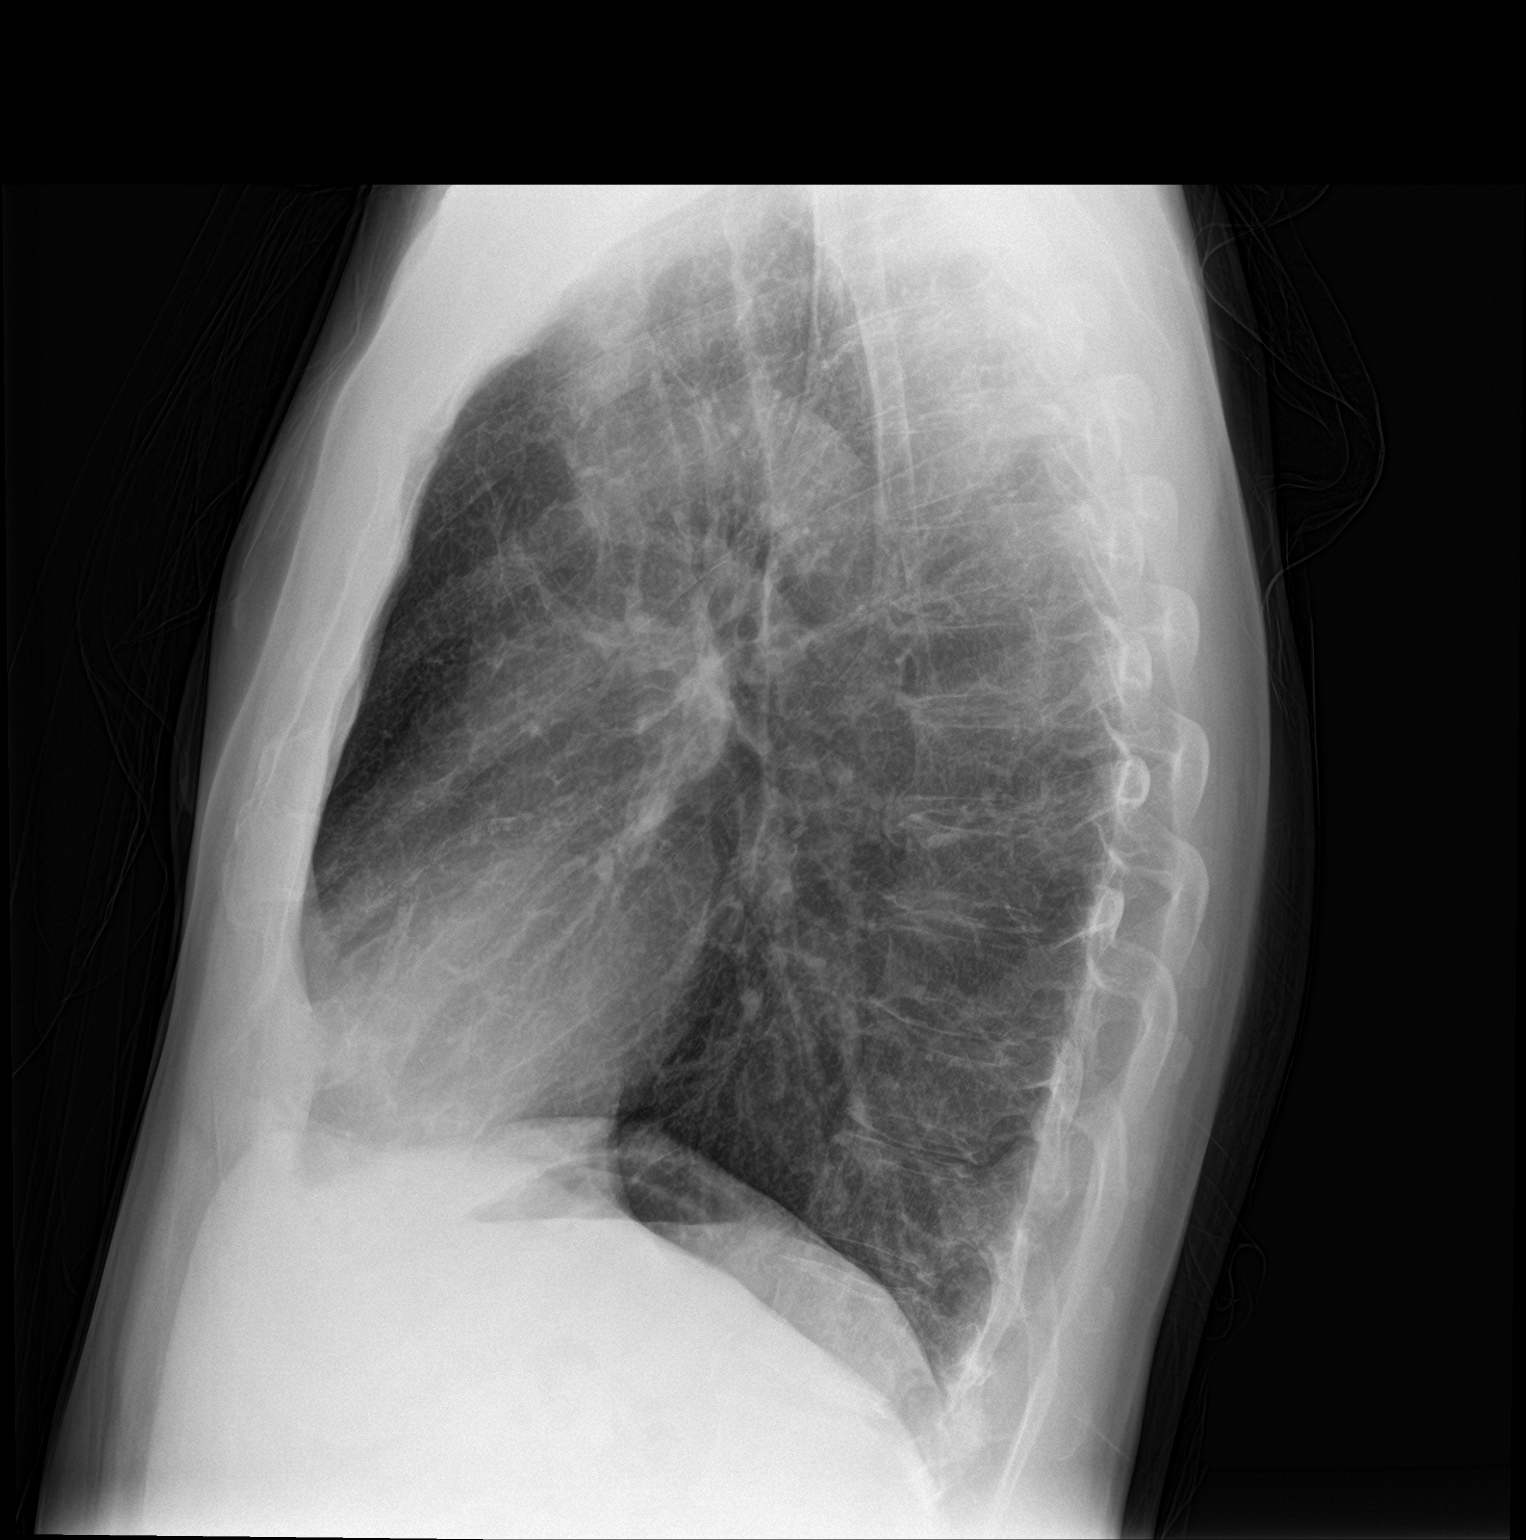

[2 of 2 positions shown; findings below may reference images not displayed]

FINDINGS: Cardiomediastinal silhouette is normal. Redemonstration of LEFT
upper lobe pulmonary nodule. Mild hyperinflation chronic
interstitial changes. No pleural effusion or focal consolidation. No
pneumothorax. Mild degenerative change of thoracic spine.
IMPRESSION: Mild suspected COPD.

Redemonstration of LEFT upper lobe pulmonary nodule.

## 2018-12-22 ENCOUNTER — Other Ambulatory Visit: Payer: Self-pay | Admitting: Primary Care

## 2018-12-22 DIAGNOSIS — E785 Hyperlipidemia, unspecified: Secondary | ICD-10-CM

## 2019-01-17 ENCOUNTER — Telehealth: Payer: Self-pay | Admitting: Primary Care

## 2019-01-17 DIAGNOSIS — F32A Depression, unspecified: Secondary | ICD-10-CM

## 2019-01-17 DIAGNOSIS — I10 Essential (primary) hypertension: Secondary | ICD-10-CM

## 2019-01-17 DIAGNOSIS — F419 Anxiety disorder, unspecified: Secondary | ICD-10-CM

## 2019-01-17 DIAGNOSIS — F329 Major depressive disorder, single episode, unspecified: Secondary | ICD-10-CM

## 2019-01-17 NOTE — Telephone Encounter (Signed)
Pt need refills for all his medications. But will call back because he didn't know which ones.

## 2019-01-18 MED ORDER — ESCITALOPRAM OXALATE 10 MG PO TABS: ORAL_TABLET | ORAL | 0 refills | Status: DC

## 2019-01-18 MED ORDER — BUPROPION HCL ER (XL) 150 MG PO TB24: ORAL_TABLET | ORAL | 0 refills | Status: DC

## 2019-01-18 MED ORDER — LISINOPRIL 20 MG PO TABS: ORAL_TABLET | ORAL | 0 refills | Status: DC

## 2019-01-18 NOTE — Telephone Encounter (Signed)
Refilled as requested to [facility]. Patient has been notified.

## 2019-01-18 NOTE — Telephone Encounter (Signed)
Pt called office requesting a refill on his Bupropion, Lisinopril, and Esciatlopram. Pt uses [facility] on [address]. Pt stated he needs refills on all medications and is requesting a call form Kate's CMA.

## 2019-02-02 ENCOUNTER — Other Ambulatory Visit (INDEPENDENT_AMBULATORY_CARE_PROVIDER_SITE_OTHER): Payer: 59

## 2019-02-02 ENCOUNTER — Encounter: Payer: Self-pay | Admitting: Primary Care

## 2019-02-02 ENCOUNTER — Ambulatory Visit (INDEPENDENT_AMBULATORY_CARE_PROVIDER_SITE_OTHER): Payer: 59 | Admitting: Primary Care

## 2019-02-02 VITALS — BP 124/80 | HR 61 | Temp 98.0°F | Ht 72.0 in | Wt 173.8 lb

## 2019-02-02 DIAGNOSIS — F329 Major depressive disorder, single episode, unspecified: Secondary | ICD-10-CM

## 2019-02-02 DIAGNOSIS — I10 Essential (primary) hypertension: Secondary | ICD-10-CM

## 2019-02-02 DIAGNOSIS — F1020 Alcohol dependence, uncomplicated: Secondary | ICD-10-CM

## 2019-02-02 DIAGNOSIS — I200 Unstable angina: Secondary | ICD-10-CM

## 2019-02-02 DIAGNOSIS — Z0000 Encounter for general adult medical examination without abnormal findings: Secondary | ICD-10-CM | POA: Diagnosis not present

## 2019-02-02 DIAGNOSIS — E039 Hypothyroidism, unspecified: Secondary | ICD-10-CM | POA: Diagnosis not present

## 2019-02-02 DIAGNOSIS — I2510 Atherosclerotic heart disease of native coronary artery without angina pectoris: Secondary | ICD-10-CM

## 2019-02-02 DIAGNOSIS — J449 Chronic obstructive pulmonary disease, unspecified: Secondary | ICD-10-CM

## 2019-02-02 DIAGNOSIS — E785 Hyperlipidemia, unspecified: Secondary | ICD-10-CM

## 2019-02-02 DIAGNOSIS — F419 Anxiety disorder, unspecified: Secondary | ICD-10-CM | POA: Diagnosis not present

## 2019-02-02 DIAGNOSIS — F32A Depression, unspecified: Secondary | ICD-10-CM

## 2019-02-02 DIAGNOSIS — Z1211 Encounter for screening for malignant neoplasm of colon: Secondary | ICD-10-CM

## 2019-02-02 LAB — COMPREHENSIVE METABOLIC PANEL
ALK PHOS: 65 U/L (ref 39–117)
ALT: 24 U/L (ref 0–53)
AST: 23 U/L (ref 0–37)
Albumin: 4.4 g/dL (ref 3.5–5.2)
BUN: 19 mg/dL (ref 6–23)
CHLORIDE: 101 meq/L (ref 96–112)
CO2: 32 meq/L (ref 19–32)
Calcium: 9.5 mg/dL (ref 8.4–10.5)
Creatinine, Ser: 0.97 mg/dL (ref 0.40–1.50)
GFR: 80.56 mL/min (ref >60.00)
Glucose, Bld: 82 mg/dL (ref 70–99)
POTASSIUM: 4.6 meq/L (ref 3.5–5.1)
Sodium: 136 mEq/L (ref 135–145)
TOTAL PROTEIN: 7.1 g/dL (ref 6.0–8.3)
Total Bilirubin: 0.4 mg/dL (ref 0.2–1.2)

## 2019-02-02 LAB — LIPID PANEL
CHOL/HDL RATIO: 5
Cholesterol: 246 mg/dL — ABNORMAL HIGH (ref 0–200)
HDL: 51.9 mg/dL (ref >39.00)
LDL CALC: 158 mg/dL — AB (ref 0–99)
NONHDL: 194.23
Triglycerides: 182 mg/dL — ABNORMAL HIGH (ref 0.0–149.0)
VLDL: 36.4 mg/dL (ref 0.0–40.0)

## 2019-02-02 LAB — TSH: TSH: 1.72 u[IU]/mL (ref 0.35–4.50)

## 2019-02-02 LAB — HEMOGLOBIN A1C: Hgb A1c MFr Bld: 5.5 % (ref 4.6–6.5)

## 2019-02-02 MED ORDER — LISINOPRIL 20 MG PO TABS: ORAL_TABLET | ORAL | 3 refills | Status: DC

## 2019-02-02 MED ORDER — BUPROPION HCL ER (XL) 150 MG PO TB24: ORAL_TABLET | ORAL | 3 refills | Status: DC

## 2019-02-02 MED ORDER — ESCITALOPRAM OXALATE 10 MG PO TABS: ORAL_TABLET | ORAL | 3 refills | Status: DC

## 2019-02-02 NOTE — Assessment & Plan Note (Signed)
Stable in the office today, continue current regimen. BMP pending. 

## 2019-02-02 NOTE — Patient Instructions (Signed)
Stop by the lab prior to leaving today. I will notify you of your results once received.   You will be contacted regarding your referral to GI for the colonoscopy.  Please let us know if you have not been contacted within one week.   Start exercising. You should be getting 150 minutes of moderate intensity exercise weekly.  It's important to improve your diet by reducing consumption of fast food, fried food, processed snack foods, sugary drinks. Increase consumption of fresh vegetables and fruits, whole grains, water.  Ensure you are drinking 64 ounces of water daily.  We will see you in one year for your annual exam or sooner if needed.  It was a pleasure to see you today!   Preventive Care 40-64 Years, Male Preventive care refers to lifestyle choices and visits with your health care provider that can promote health and wellness. What does preventive care include?   A yearly physical exam. This is also called an annual well check.  Dental exams once or twice a year.  Routine eye exams. Ask your health care provider how often you should have your eyes checked.  Personal lifestyle choices, including: ? Daily care of your teeth and gums. ? Regular physical activity. ? Eating a healthy diet. ? Avoiding tobacco and drug use. ? Limiting alcohol use. ? Practicing safe sex. ? Taking low-dose aspirin every day starting at age 55. What happens during an annual well check? The services and screenings done by your health care provider during your annual well check will depend on your age, overall health, lifestyle risk factors, and family history of disease. Counseling Your health care provider may ask you questions about your:  Alcohol use.  Tobacco use.  Drug use.  Emotional well-being.  Home and relationship well-being.  Sexual activity.  Eating habits.  Work and work Statistician. Screening You may have the following tests or measurements:  Height, weight, and  BMI.  Blood pressure.  Lipid and cholesterol levels. These may be checked every 5 years, or more frequently if you are over 27 years old.  Skin check.  Lung cancer screening. You may have this screening every year starting at age 87 if you have a 30-pack-year history of smoking and currently smoke or have quit within the past 15 years.  Colorectal cancer screening. All adults should have this screening starting at age 53 and continuing until age 69. Your health care provider may recommend screening at age 41. You will have tests every 1-10 years, depending on your results and the type of screening test. People at increased risk should start screening at an earlier age. Screening tests may include: ? Guaiac-based fecal occult blood testing. ? Fecal immunochemical test (FIT). ? Stool DNA test. ? Virtual colonoscopy. ? Sigmoidoscopy. During this test, a flexible tube with a tiny camera (sigmoidoscope) is used to examine your rectum and lower colon. The sigmoidoscope is inserted through your anus into your rectum and lower colon. ? Colonoscopy. During this test, a long, thin, flexible tube with a tiny camera (colonoscope) is used to examine your entire colon and rectum.  Prostate cancer screening. Recommendations will vary depending on your family history and other risks.  Hepatitis C blood test.  Hepatitis B blood test.  Sexually transmitted disease (STD) testing.  Diabetes screening. This is done by checking your blood sugar (glucose) after you have not eaten for a while (fasting). You may have this done every 1-3 years. Discuss your test results, treatment options, and if  necessary, the need for more tests with your health care provider. Vaccines Your health care provider may recommend certain vaccines, such as:  Influenza vaccine. This is recommended every year.  Tetanus, diphtheria, and acellular pertussis (Tdap, Td) vaccine. You may need a Td booster every 10 years.  Varicella  vaccine. You may need this if you have not been vaccinated.  Zoster vaccine. You may need this after age 63.  Measles, mumps, and rubella (MMR) vaccine. You may need at least one dose of MMR if you were born in 1957 or later. You may also need a second dose.  Pneumococcal 13-valent conjugate (PCV13) vaccine. You may need this if you have certain conditions and have not been vaccinated.  Pneumococcal polysaccharide (PPSV23) vaccine. You may need one or two doses if you smoke cigarettes or if you have certain conditions.  Meningococcal vaccine. You may need this if you have certain conditions.  Hepatitis A vaccine. You may need this if you have certain conditions or if you travel or work in places where you may be exposed to hepatitis A.  Hepatitis B vaccine. You may need this if you have certain conditions or if you travel or work in places where you may be exposed to hepatitis B.  Haemophilus influenzae type b (Hib) vaccine. You may need this if you have certain risk factors. Talk to your health care provider about which screenings and vaccines you need and how often you need them. This information is not intended to replace advice given to you by your health care provider. Make sure you discuss any questions you have with your health care provider. Document Released: 01/10/2016 Document Revised: 02/03/2018 Document Reviewed: 10/15/2015 Elsevier Interactive Patient Education  2019 [employer].

## 2019-02-02 NOTE — Assessment & Plan Note (Signed)
Asymptomatic. 

## 2019-02-02 NOTE — Progress Notes (Signed)
Subjective:    Patient ID: Kurt Treese., male    DOB: 1966/02/25, 53 y.o.   MRN: [id number]  HPI  Mr. Kurt [name] is a 53 year old male who presents today for complete physical.  Immunizations: -Tetanus: Completed in 2011 -Influenza: Declines  -Pneumonia: Completed in 2009  Diet: He endorses a healthy diet Breakfast: Fast food, cereal  Lunch: Restaurant food Dinner: Meat, vegetable, starch Snacks: Chips, Veggies with dip Desserts: Infrequent  Beverages: Sweet tea, coffee, water, soda  Exercise: He is not exercising  Eye exam: Completed in 2018 Dental exam: Completes annually  Colonoscopy: Completed about 10 year ago PSA: Due Hep C Screen:  BP Readings from Last 3 Encounters:  02/02/19 124/80  10/05/18 (!) 146/92  03/27/18 (!) 148/97     Review of Systems  Constitutional: Negative for unexpected weight change.  HENT: Negative for rhinorrhea.   Respiratory: Negative for cough and shortness of breath.   Cardiovascular: Negative for chest pain.  Gastrointestinal: Negative for constipation and diarrhea.  Genitourinary: Negative for difficulty urinating.  Musculoskeletal: Negative for arthralgias and myalgias.  Skin: Negative for rash.  Allergic/Immunologic: Negative for environmental allergies.  Neurological: Negative for dizziness, numbness and headaches.  Psychiatric/Behavioral: Negative for suicidal ideas.       Past Medical History:  Diagnosis Date  . Anginal pain ([place])   . Anxiety   . Avascular necrosis of bone of left hip ([place]) 03/24/2016  . Avascular necrosis of bone of right hip ([place]) 01/14/2016  . Coronary artery disease   . Coronary disease 09/12/2015  . Depression   . History of MI (myocardial infarction) 09/12/2015  . Hyperlipidemia   . Hypertension   . Myocardial infarction ([place])   . 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([place])   . Thyroid disease    reports that he has been low in the past but since he has  stoped ETOH it has been normal     Social History   Socioeconomic History  . Marital status: Legally Separated    Spouse name: Not on file  . Number of children: Not on file  . Years of education: Not on file  . Highest education level: Not on file  Occupational History  . Not on file  Social Needs  . Financial resource strain: Not on file  . Food insecurity:    Worry: Not on file    Inability: Not on file  . Transportation needs:    Medical: Not on file    Non-medical: Not on file  Tobacco Use  . Smoking status: Former Smoker    Packs/day: 1.50    Years: 20.00    Pack years: 30.00    Types: Cigarettes    Last attempt to quit: 11/25/2015    Years since quitting: 3.1  . Smokeless tobacco: Never Used  Substance and Sexual Activity  . Alcohol use: No    Alcohol/week: 0.0 standard drinks    Comment: 4 months-recovering alcoholic per pt  . Drug use: No  . Sexual activity: Not on file  Lifestyle  . Physical activity:    Days per week: Not on file    Minutes per session: Not on file  . Stress: Not on file  Relationships  . Social connections:    Talks on phone: Not on file    Gets together: Not on file    Attends religious service: Not on file    Active member of club or organization: Not on  file    Attends meetings of clubs or organizations: Not on file    Relationship status: Not on file  . Intimate partner violence:    Fear of current or ex partner: Not on file    Emotionally abused: Not on file    Physically abused: Not on file    Forced sexual activity: Not on file  Other Topics Concern  . Not on file  Social History Narrative   Separated.   Works as a Optician, dispensing.   Enjoys fishing, Proofreader, golfing.    Past Surgical History:  Procedure Laterality Date  . CARDIAC CATHETERIZATION     many years ago in 1990's  . CORONARY STENT PLACEMENT    . TONSILLECTOMY     as a child  . 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 TOTAL HIP ARTHROPLASTY Left 03/24/2016   Procedure: TOTAL HIP ARTHROPLASTY;  Surgeon: [name], MD;  Location: [facility];  Service: Orthopedics;  Laterality: Left;    Family History  Problem Relation Age of Onset  . Cancer Mother   . Dementia Father   . Cancer Maternal Grandmother   . Cancer Maternal Grandfather   . Cancer Paternal Grandmother   . Cancer Paternal Grandfather     Allergies  Allergen Reactions  . Peanut-Containing Drug Products Anaphylaxis  . Erythromycin Other (See Comments)    Unknown childhood allergy    Current Outpatient Medications on File Prior to Visit  Medication Sig Dispense Refill  . aspirin 325 MG tablet Take 325 mg by mouth daily.    Marland Kitchen atorvastatin (LIPITOR) 20 MG tablet Take 1 tablet by moth daily in the evening NEED APPOINTMENT FOR ANY MORE REFILLS 30 tablet 0  . carvedilol (COREG) 6.25 MG tablet Take 1 tablet (6.25 mg total) by mouth 2 (two) times daily with a meal. 180 tablet 3  . Fluticasone-Salmeterol (ADVAIR) 250-50 MCG/DOSE AEPB INHALE 1 PUFF INTO THE LUNGS  TWICE DAILY 60 each 1  . Multiple Vitamin (MULTIVITAMIN WITH MINERALS) TABS tablet Take 1 tablet by mouth daily.    . nitroGLYCERIN (NITROSTAT) 0.4 MG SL tablet Place 1 tablet (0.4 mg total) under the tongue every 5 (five) minutes as needed for chest pain. 100 tablet 0  . Omega-3 Fatty Acids (FISH OIL) 1000 MG CAPS Take by mouth.    . VENTOLIN HFA 108 (90 Base) MCG/ACT inhaler INHALE 2 PUFFS BY MOUTH EVERY 4 HOURS AS NEEDED FOR WHEEZING OR COUGH 18 each 0   No current facility-administered medications on file prior to visit.     BP 124/80   Pulse 61   Temp 98 F (36.7 C) (Oral)   Ht 6' (1.829 m)   Wt 173 lb 12 oz (78.8 kg)   SpO2 96%   BMI 23.56 kg/m    Objective:   Physical Exam  Constitutional: He is oriented to person, place, and time. He appears well-nourished.  HENT:    Mouth/Throat: No oropharyngeal exudate.  Eyes: Pupils are equal, round, and reactive to light. EOM are normal.  Neck: Neck supple. No thyromegaly present.  Cardiovascular: Normal rate and regular rhythm.  Respiratory: Effort normal and breath sounds normal.  GI: Soft. Bowel sounds are normal. There is no abdominal tenderness.  Musculoskeletal: Normal range of motion.  Neurological: He is alert and oriented to person, place, and time.  Skin: Skin is warm and  dry.  Psychiatric: He has a normal mood and affect.           Assessment & Plan:

## 2019-02-02 NOTE — Assessment & Plan Note (Signed)
Denies symptoms. No use of Advair or albuterol. Exam unremarkable. Continue to monitor.

## 2019-02-02 NOTE — Assessment & Plan Note (Signed)
Doing well on current regimen, continue same. Refills sent to pharmacy.  Denies SI/HI.

## 2019-02-02 NOTE — Assessment & Plan Note (Signed)
Declines influenza vaccination, tetanus UTD. PSA pending. Colonoscopy due, referral placed.  Recommended regular exercise, healthy diet. Exam stable. Labs pending. Follow up in 1 year for CPE.

## 2019-02-02 NOTE — Assessment & Plan Note (Signed)
Endorses sobriety over the last several months, commended him on this success.

## 2019-02-02 NOTE — Assessment & Plan Note (Signed)
Compliant to statin and aspirin, continue same. Lipid panel pending.

## 2019-02-02 NOTE — Assessment & Plan Note (Signed)
TSH unremarkable in 2018. Asymptotic and not on meds.  Repeat TSH pending.

## 2019-02-02 NOTE — Assessment & Plan Note (Signed)
Repeat lipids pending, continue to monitor.

## 2019-02-03 ENCOUNTER — Encounter: Payer: Self-pay | Admitting: Gastroenterology

## 2019-02-08 ENCOUNTER — Encounter: Payer: Self-pay | Admitting: *Deleted

## 2019-02-21 ENCOUNTER — Ambulatory Visit (AMBULATORY_SURGERY_CENTER): Payer: Self-pay | Admitting: *Deleted

## 2019-02-21 VITALS — Ht 72.0 in | Wt 185.0 lb

## 2019-02-21 DIAGNOSIS — Z800 Family history of malignant neoplasm of digestive organs: Secondary | ICD-10-CM

## 2019-02-21 MED ORDER — NA SULFATE-K SULFATE-MG SULF 17.5-3.13-1.6 GM/177ML PO SOLN: 1.0000 | Freq: Once | ORAL | 0 refills | Status: AC

## 2019-02-21 NOTE — Progress Notes (Signed)
No egg or soy allergy known to patient  No issues with past sedation with any surgeries  or procedures, no intubation problems  No diet pills per patient No home 02 use per patient  No blood thinners per patient  Pt denies issues with constipation  No A fib or A flutter  EMMI video sent to pt's e mail - declined  Suprep $15 coupon to pt

## 2019-02-22 ENCOUNTER — Encounter: Payer: Self-pay | Admitting: Gastroenterology

## 2019-03-02 ENCOUNTER — Ambulatory Visit (AMBULATORY_SURGERY_CENTER): Payer: 59 | Admitting: Gastroenterology

## 2019-03-02 ENCOUNTER — Encounter: Payer: Self-pay | Admitting: Gastroenterology

## 2019-03-02 VITALS — BP 116/63 | HR 55 | Temp 96.4°F | Resp 12 | Ht 72.0 in | Wt 185.0 lb

## 2019-03-02 DIAGNOSIS — Z1211 Encounter for screening for malignant neoplasm of colon: Secondary | ICD-10-CM | POA: Diagnosis not present

## 2019-03-02 DIAGNOSIS — Z800 Family history of malignant neoplasm of digestive organs: Secondary | ICD-10-CM | POA: Diagnosis not present

## 2019-03-02 DIAGNOSIS — K641 Second degree hemorrhoids: Secondary | ICD-10-CM

## 2019-03-02 DIAGNOSIS — D125 Benign neoplasm of sigmoid colon: Secondary | ICD-10-CM

## 2019-03-02 DIAGNOSIS — D123 Benign neoplasm of transverse colon: Secondary | ICD-10-CM

## 2019-03-02 HISTORY — PX: COLONOSCOPY: SHX174

## 2019-03-02 MED ORDER — SODIUM CHLORIDE 0.9 % IV SOLN: 500.0000 mL | Freq: Once | INTRAVENOUS | Status: DC

## 2019-03-02 NOTE — Patient Instructions (Signed)
Discharge instructions given. Handouts on polyps and hemorrhoids. Resume aspirin at prior dose in 5 days. Resume other medications today. Office will schedule repeat colonoscopy in 6 months.  Recommend to be done at [facility]:   Refer to the procedure report that was given to you for any specific questions about what was found during the examination.  If the procedure report does not answer your questions, please call your gastroenterologist to clarify.  If you requested that your care partner not be given the details of your procedure findings, then the procedure report has been included in a sealed envelope for you to review at your convenience later.  YOU SHOULD EXPECT: Some feelings of bloating in the abdomen. Passage of more gas than usual.  Walking can help get rid of the air that was put into your GI tract during the procedure and reduce the bloating. If you had a lower endoscopy (such as a colonoscopy or flexible sigmoidoscopy) you may notice spotting of blood in your stool or on the toilet paper. If you underwent a bowel prep for your procedure, you may not have a normal bowel movement for a few days.  Please Note:  You might notice some irritation and congestion in your nose or some drainage.  This is from the oxygen used during your procedure.  There is no need for concern and it should clear up in a day or so.  SYMPTOMS TO REPORT IMMEDIATELY:   Following lower endoscopy (colonoscopy or flexible sigmoidoscopy):  Excessive amounts of blood in the stool  Significant tenderness or worsening of abdominal pains  Swelling of the abdomen that is new, acute  Fever of 100F or higher  For urgent or emergent issues, a gastroenterologist can be reached at any hour by calling [phone].   DIET:  We do recommend a small meal at first, but then you may proceed to your regular diet.  Drink plenty of fluids but  you should avoid alcoholic beverages for 24 hours.  ACTIVITY:  You should plan to take it easy for the rest of today and you should NOT DRIVE or use heavy machinery until tomorrow (because of the sedation medicines used during the test).    FOLLOW UP: Our staff will call the number listed on your records the next business day following your procedure to check on you and address any questions or concerns that you may have regarding the information given to you following your procedure. If we do not reach you, we will leave a message.  However, if you are feeling well and you are not experiencing any problems, there is no need to return our call.  We will assume that you have returned to your regular daily activities without incident.  If any biopsies were taken you will be contacted by phone or by letter within the next 1-3 weeks.  Please call us at [phone] if you have not heard about the biopsies in 3 weeks.    SIGNATURES/CONFIDENTIALITY: You and/or your care partner have signed paperwork which will be entered into your electronic medical record.  These signatures attest to the fact that that the information above on your After Visit Summary has been reviewed and is understood.  Full responsibility of the confidentiality of this discharge information lies with you and/or your care-partner.

## 2019-03-02 NOTE — Op Note (Signed)
[facility] Patient Name: Kurt [name] Procedure Date: 03/02/2019 8:40 AM MRN: [id number] Endoscopist: Gerrit Heck , MD Age: 53 Referring MD:  Date of Birth: 1966-03-15 Gender: Male Account #: [id number] Procedure:                Colonoscopy Indications:              Screening in patient at increased risk: Colorectal                            cancer in brother before age 74, High risk colon                            cancer surveillance: Personal history of colonic                            polyps Medicines:                Monitored Anesthesia Care Procedure:                Pre-Anesthesia Assessment:                           - Prior to the procedure, a History and Physical                            was performed, and patient medications and                            allergies were reviewed. The patient's tolerance of                            previous anesthesia was also reviewed. The risks                            and benefits of the procedure and the sedation                            options and risks were discussed with the patient.                            All questions were answered, and informed consent                            was obtained. Prior Anticoagulants: The patient has                            taken aspirin, last dose was 1 day prior to                            procedure. ASA Grade Assessment: II - A patient                            with mild systemic disease. After reviewing the  risks and benefits, the patient was deemed in                            satisfactory condition to undergo the procedure.                           After obtaining informed consent, the colonoscope                            was passed under direct vision. Throughout the                            procedure, the patient's blood pressure, pulse, and                            oxygen saturations were monitored continuously. The                         Colonoscope was introduced through the anus and                            advanced to the the cecum, identified by                            appendiceal orifice and ileocecal valve. The                            colonoscopy was technically difficult and complex                            due to significant looping. Successful completion                            of the procedure was aided by applying abdominal                            pressure. The patient tolerated the procedure well.                            The quality of the bowel preparation was adequate.                            The ileocecal valve, appendiceal orifice, and                            rectum were photographed. Scope In: 8:42:47 AM Scope Out: 9:48:52 AM Scope Withdrawal Time: 0 hours 23 minutes 9 seconds  Total Procedure Duration: 1 hour 6 minutes 5 seconds  Findings:                 Skin tags were found on perianal exam.                           A 20 mm polyp was found in the sigmoid colon. The  polyp was flat. the outer borders of the polyp were                            marked with cautery 1-2 mm from the margin edge.                            The polyp was then removed with a saline injection                            (10 cc injected) then hot snare. Cold forceps were                            used to remove edges via avulsion techinique                            followed by tissue fulguration of the edges with                            the tip of the hot snare with coag. Resection and                            retrieval were complete. Area 3-4 cm distal to the                            lesion was tattooed with an injection of 5 mL of                            Spot (carbon black).                           A 2 mm polyp was found in the sigmoid colon. The                            polyp was sessile. The polyp was removed with a                             cold biopsy forceps. Resection and retrieval were                            complete. Estimated blood loss was minimal.                           A 6 mm polyp was found in the sigmoid colon. The                            polyp was sessile. The polyp was removed with a                            cold snare. Resection and retrieval were complete.  Estimated blood loss was minimal.                           The hepatic flexure was moderately tortuous.                            External pressure was applied to the patient's                            abdomen in order to accomplish the maneuver.                           Non-bleeding internal hemorrhoids were found during                            retroflexion. The hemorrhoids were medium-sized.                           Retroflexion in the right colon was performed. Complications:            No immediate complications. Estimated Blood Loss:     Estimated blood loss was minimal. Impression:               - Perianal skin tags found on perianal exam.                           - One 20 mm polyp in the sigmoid colon, removed                            using injection-lift and a hot snare. Resected and                            retrieved. Biopsied. Tattooed.                           - One 2 mm polyp in the sigmoid colon, removed with                            a cold biopsy forceps. Resected and retrieved.                           - One 6 mm polyp in the sigmoid colon, removed with                            a cold snare. Resected and retrieved.                           - Tortuous colon.                           - Non-bleeding internal hemorrhoids. Recommendation:           - Patient has a contact number available for                            emergencies.  The signs and symptoms of potential                            delayed complications were discussed with the                            patient. Return  to normal activities tomorrow.                            Written discharge instructions were provided to the                            patient.                           - Resume previous diet.                           - Resume aspirin at prior dose in 5 days.                           - Await pathology results.                           - Repeat colonoscopy in 6 months for surveillance                            after piecemeal polypectomy. Recommend this be done                            at [facility]                           - Return to GI clinic PRN.                           - Use fiber, for example Citrucel, Fibercon, Konsyl                            or Metamucil.                           - Internal hemorrhoids were noted on this study and                            may be amenable to hemorrhoid band ligation. If you                            are interested in further treatment of these                            hemorrhoids with band ligation, please contact my                            clinic to set up an appointment for evaluation and  treatment. Gerrit Heck, MD 03/02/2019 10:03:27 AM

## 2019-03-02 NOTE — Progress Notes (Signed)
To PACU< VSS. Report to [email or web address] 

## 2019-03-02 NOTE — Progress Notes (Signed)
Pt's states no medical or surgical changes since previsit or office visit. 

## 2019-03-03 ENCOUNTER — Telehealth: Payer: Self-pay | Admitting: *Deleted

## 2019-03-03 NOTE — Telephone Encounter (Signed)
  Follow up Call-  Call back number 03/02/2019  Post procedure Call Back phone  # [phone]  Permission to leave phone message Yes  Some recent data might be hidden     Patient questions:  Do you have a fever, pain , or abdominal swelling? No. Pain Score  0 *  Have you tolerated food without any problems? Yes.    Have you been able to return to your normal activities? Yes.    Do you have any questions about your discharge instructions: Diet   No. Medications  No. Follow up visit  No.  Do you have questions or concerns about your Care? No.  Actions: * If pain score is 4 or above: No action needed, pain <4.

## 2019-03-16 ENCOUNTER — Encounter: Payer: Self-pay | Admitting: Gastroenterology

## 2019-03-16 ENCOUNTER — Telehealth: Payer: Self-pay | Admitting: Gastroenterology

## 2019-03-16 NOTE — Telephone Encounter (Signed)
The pt was given the information that is written in the letter.  He has been advised he will be mailed a copy

## 2019-03-16 NOTE — Telephone Encounter (Signed)
Pt requested a CB to discuss pathology results from colonoscopy.

## 2019-08-30 ENCOUNTER — Other Ambulatory Visit: Payer: Self-pay | Admitting: Emergency Medicine

## 2019-08-30 ENCOUNTER — Other Ambulatory Visit: Payer: Self-pay

## 2019-08-30 ENCOUNTER — Encounter: Payer: Self-pay | Admitting: Family Medicine

## 2019-08-30 ENCOUNTER — Ambulatory Visit (INDEPENDENT_AMBULATORY_CARE_PROVIDER_SITE_OTHER): Payer: 59 | Admitting: Family Medicine

## 2019-08-30 DIAGNOSIS — Z20822 Contact with and (suspected) exposure to covid-19: Secondary | ICD-10-CM

## 2019-08-30 DIAGNOSIS — B349 Viral infection, unspecified: Secondary | ICD-10-CM

## 2019-08-30 NOTE — Assessment & Plan Note (Signed)
Headache,congestion, diarrhea and nausea  Chills Theophilus Bones ? Fever and no appetite  Sent for covid testing  Then isolation at home  Fluids/rest Ibuprofen prn  Call if worse symptoms Start checking temp Watch for cough/sob as well  Will update with result or earlier if needed

## 2019-08-30 NOTE — Patient Instructions (Addendum)
Go to the [facility] testing site to get tested for covid Then go home and isolate until negative test result and feeling better Ibuprofen with food 600 mg up to every 6-8 hours is ok for pain /headache or fever  Start checking your temperature and keep track of it  We will let you know when we get a result  If symptoms suddenly worsen or no improvement in a week please call  Drink lots of fluids and rest  Watch for cough or shortness of breath

## 2019-08-30 NOTE — Progress Notes (Signed)
Virtual Visit via Video Note  I connected with Kurt Kurt [name]. on 08/30/19 at 11:30 AM EDT by a video enabled telemedicine application and verified that I am speaking with the correct person using two identifiers.  Location: Patient: work  Provider: office    I discussed the limitations of evaluation and management by telemedicine and the availability of in person appointments. The patient expressed understanding and agreed to proceed.  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 Viral syndrome 08/30/2019  . Elevated LFTs 10/04/2017  . COPD (chronic obstructive pulmonary disease) ([place]) 10/04/2017  . Nocturia 10/04/2017  . Malnutrition of moderate degree 03/28/2016  . Postoperative anemia due to acute blood loss 03/27/2016  . Avascular necrosis of bone of left hip ([place]) 03/24/2016  . 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  . Avascular necrosis of bone of right hip ([place]) 01/14/2016  . Avascular necrosis of hip ([place]) 01/14/2016  . 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 Unstable angina ([place]) 11/25/2015  . Arteriosclerosis of coronary artery 10/20/2015  . HLD (hyperlipidemia) 10/20/2015  . Alcoholism ([place]) 10/20/2015  . History of MI (myocardial infarction)  09/12/2015  . Anxiety and depression 07/31/2015  . Testosterone deficiency 07/31/2015  . Preventative health care 07/31/2015  . Benign essential HTN 06/28/2014  . Acquired hypothyroidism 04/06/2013  . General patient noncompliance 03/29/2013   Past Medical History:  Diagnosis Date  . Allergy    mild  . Anginal pain ([place])   . Anxiety   . Arthritis    maybe in hands   . Avascular necrosis of bone of left hip ([place]) 03/24/2016  . Avascular necrosis of bone of right hip ([place]) 01/14/2016  . COPD (chronic obstructive pulmonary disease) ([place])   . Coronary artery disease   . Coronary disease 09/12/2015  . Depression   . History of MI (myocardial infarction) 09/12/2015  . Hyperlipidemia   . Hypertension   . Myocardial infarction ([place])    20 yrs ago   . 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([place])   . Thyroid disease    reports that he has been low in the past but since he has stoped ETOH it has been normal   Past Surgical History:  Procedure Laterality Date  . CARDIAC CATHETERIZATION     many years ago in 1990's  . COLONOSCOPY     12 yrs ago - ? where   . CORONARY STENT PLACEMENT    . TONSILLECTOMY     as a child  . TOTAL HIP ARTHROPLASTY Right 01/14/2016   Procedure: TOTAL HIP ARTHROPLASTY;  Surgeon: [name], MD;  Location: [facility]  OR;  Service: Orthopedics;  Laterality: Right;  . TOTAL HIP ARTHROPLASTY Left 03/24/2016   Procedure: TOTAL HIP ARTHROPLASTY;  Surgeon: [name], MD;  Location: [place];  Service: Orthopedics;  Laterality: Left;   Social History   Tobacco Use  . Smoking status: Former Smoker    Packs/day: 1.50    Years: 20.00    Pack years: 30.00    Types: Cigarettes    Quit date: 11/25/2015    Years since quitting: 3.7  . Smokeless tobacco: Never Used  Substance Use Topics  . Alcohol use: No    Alcohol/week: 0.0 standard drinks    Comment: 4 months-recovering alcoholic per pt  . Drug use: No   Family History  Problem Relation  Age of Onset  . Cancer Mother   . Dementia Father   . Colon cancer Brother 14  . Cancer Maternal Grandmother   . Cancer Maternal Grandfather   . Cancer Paternal Grandmother   . Cancer Paternal Grandfather   . Colon polyps Neg Hx   . Esophageal cancer Neg Hx   . Rectal cancer Neg Hx   . Stomach cancer Neg Hx    Allergies  Allergen Reactions  . Peanut-Containing Drug Products Anaphylaxis  . Erythromycin Other (See Comments)    Unknown childhood allergy   Current Outpatient Medications on File Prior to Visit  Medication Sig Dispense Refill  . aspirin 325 MG tablet Take 325 mg by mouth daily.    Marland Kitchen atorvastatin (LIPITOR) 20 MG tablet Take 1 tablet by moth daily in the evening NEED APPOINTMENT FOR ANY MORE REFILLS 30 tablet 0  . buPROPion (WELLBUTRIN XL) 150 MG 24 hr tablet Take 1 tablet by mouth once daily for anxiety and depression. 90 tablet 3  . carvedilol (COREG) 6.25 MG tablet Take 1 tablet (6.25 mg total) by mouth 2 (two) times daily with a meal. 180 tablet 3  . escitalopram (LEXAPRO) 10 MG tablet Take 1 tablet by mouth once daily for anxiety and depression. 90 tablet 3  . Fluticasone-Salmeterol (ADVAIR) 250-50 MCG/DOSE AEPB INHALE 1 PUFF INTO THE LUNGS  TWICE DAILY 60 each 1  . lisinopril (PRINIVIL,ZESTRIL) 20 MG tablet Take 1 tablet by mouth once daily for blood pressure. 90 tablet 3  . Multiple Vitamin (MULTIVITAMIN WITH MINERALS) TABS tablet Take 1 tablet by mouth daily.    . Omega-3 Fatty Acids (FISH OIL) 1000 MG CAPS Take by mouth.    . VENTOLIN HFA 108 (90 Base) MCG/ACT inhaler INHALE 2 PUFFS BY MOUTH EVERY 4 HOURS AS NEEDED FOR WHEEZING OR COUGH 18 each 0  . nitroGLYCERIN (NITROSTAT) 0.4 MG SL tablet Place 1 tablet (0.4 mg total) under the tongue every 5 (five) minutes as needed for chest pain. (Patient not taking: Reported on 03/02/2019) 100 tablet 0   No current facility-administered medications on file prior to visit.    Review of Systems  Constitutional: Positive for  chills and malaise/fatigue. Negative for diaphoresis.  HENT: Negative for ear discharge, ear pain and sinus pain.   Eyes: Negative for discharge and redness.  Respiratory: Negative for cough, shortness of breath and stridor.   Cardiovascular: Negative for chest pain, palpitations and leg swelling.  Gastrointestinal: Positive for diarrhea and nausea. Negative for abdominal pain and vomiting.  Skin: Negative for rash.  Neurological: Positive for headaches. Negative for dizziness.    Observations/Objective: Pt sounds well  No sob or hoarseness Not distressed  No sneezing/coughing during interview Nl cognition  Nl affect   Assessment and  Plan: Problem List Items Addressed This Visit      Other   Viral syndrome    Headache,congestion, diarrhea and nausea  Chills Kurt Kurt [name] ? Fever and no appetite  Sent for covid testing  Then isolation at home  Fluids/rest Ibuprofen prn  Call if worse symptoms Start checking temp Watch for cough/sob as well  Will update with result or earlier if needed      Relevant Orders   Novel Coronavirus, NAA ([facility])       Follow Up Instructions: Go to the [facility] testing site to get tested for covid Then go home and isolate until negative test result and feeling better Ibuprofen with food 600 mg up to every 6-8 hours is ok for pain /headache or fever  Start checking your temperature and keep track of it  We will let you know when we get a result  If symptoms suddenly worsen or no improvement in a week please call  Drink lots of fluids and rest  Watch for cough or shortness of breath   I discussed the assessment and treatment plan with the patient. The patient was provided an opportunity to ask questions and all were answered. The patient agreed with the plan and demonstrated an understanding of the instructions.   The patient was advised to call back or seek an in-person evaluation if the symptoms worsen or if the condition fails to improve as  anticipated.     Loura Pardon, MD

## 2019-08-31 LAB — NOVEL CORONAVIRUS, NAA: SARS-CoV-2, NAA: NOT DETECTED

## 2019-09-01 ENCOUNTER — Telehealth: Payer: Self-pay

## 2019-09-01 NOTE — Telephone Encounter (Signed)
Pt left v/m; pt cannot get into my chart and pt request cb ASAP with covid results.

## 2019-09-01 NOTE — Telephone Encounter (Signed)
Addressed through result notes  

## 2019-09-29 ENCOUNTER — Encounter: Payer: Self-pay | Admitting: Gastroenterology

## 2019-11-03 ENCOUNTER — Encounter: Payer: Self-pay | Admitting: Gastroenterology

## 2019-11-21 ENCOUNTER — Telehealth: Payer: Self-pay | Admitting: Gastroenterology

## 2019-11-21 DIAGNOSIS — D123 Benign neoplasm of transverse colon: Secondary | ICD-10-CM

## 2019-11-21 DIAGNOSIS — D125 Benign neoplasm of sigmoid colon: Secondary | ICD-10-CM

## 2019-11-21 DIAGNOSIS — Z800 Family history of malignant neoplasm of digestive organs: Secondary | ICD-10-CM

## 2019-11-21 NOTE — Telephone Encounter (Signed)
Called and spoke with patient - patient advised he has been scheduled for colon at [facility] on 12/05/2019 at 10:30am; COVID screen scheduled on 12/01/2019 at 3:00 pm; patient will need prep RX sent in and instructions placed at front desk for him to pick up; Patient advised to call back to the office at [phone] should questions/concerns arise;  Patient verbalized understanding of information/instructions;

## 2019-11-22 ENCOUNTER — Other Ambulatory Visit: Payer: Self-pay

## 2019-11-22 DIAGNOSIS — Z800 Family history of malignant neoplasm of digestive organs: Secondary | ICD-10-CM

## 2019-11-22 DIAGNOSIS — D123 Benign neoplasm of transverse colon: Secondary | ICD-10-CM

## 2019-11-22 DIAGNOSIS — D125 Benign neoplasm of sigmoid colon: Secondary | ICD-10-CM

## 2019-11-22 DIAGNOSIS — K641 Second degree hemorrhoids: Secondary | ICD-10-CM

## 2019-11-22 MED ORDER — NA SULFATE-K SULFATE-MG SULF 17.5-3.13-1.6 GM/177ML PO SOLN: 1.0000 | Freq: Once | ORAL | 0 refills | Status: AC

## 2019-12-01 ENCOUNTER — Other Ambulatory Visit (HOSPITAL_COMMUNITY)
Admission: RE | Admit: 2019-12-01 | Discharge: 2019-12-01 | Disposition: A | Discharge status: Home / Self Care | Payer: 59 | Source: Ambulatory Visit | Attending: Gastroenterology | Admitting: Gastroenterology

## 2019-12-01 ENCOUNTER — Encounter (HOSPITAL_COMMUNITY): Payer: Self-pay

## 2019-12-01 DIAGNOSIS — Z01812 Encounter for preprocedural laboratory examination: Secondary | ICD-10-CM | POA: Diagnosis not present

## 2019-12-01 DIAGNOSIS — Z20828 Contact with and (suspected) exposure to other viral communicable diseases: Secondary | ICD-10-CM | POA: Diagnosis not present

## 2019-12-01 NOTE — Progress Notes (Signed)
Attempted to obtain medical history via telephone, unable to reach at this time. I left a voicemail to return pre surgical testing department's phone call.  

## 2019-12-02 LAB — NOVEL CORONAVIRUS, NAA (HOSP ORDER, SEND-OUT TO REF LAB; TAT 18-24 HRS): SARS-CoV-2, NAA: NOT DETECTED

## 2019-12-05 ENCOUNTER — Encounter (HOSPITAL_COMMUNITY): Payer: Self-pay | Admitting: *Deleted

## 2019-12-05 ENCOUNTER — Ambulatory Visit (HOSPITAL_COMMUNITY): Payer: 59 | Admitting: Certified Registered"

## 2019-12-05 ENCOUNTER — Ambulatory Visit (HOSPITAL_COMMUNITY)
Admission: RE | Admit: 2019-12-05 | Discharge: 2019-12-05 | Disposition: A | Discharge status: Home / Self Care | Payer: 59 | Attending: Gastroenterology | Admitting: Gastroenterology

## 2019-12-05 ENCOUNTER — Encounter (HOSPITAL_COMMUNITY)
Admission: RE | Disposition: A | Discharge status: Home / Self Care | Payer: Self-pay | Source: Home / Self Care | Attending: Gastroenterology

## 2019-12-05 ENCOUNTER — Other Ambulatory Visit: Payer: Self-pay

## 2019-12-05 DIAGNOSIS — Z860100 Personal history of colon polyps, unspecified: Secondary | ICD-10-CM

## 2019-12-05 DIAGNOSIS — I509 Heart failure, unspecified: Secondary | ICD-10-CM | POA: Insufficient documentation

## 2019-12-05 DIAGNOSIS — Z87891 Personal history of nicotine dependence: Secondary | ICD-10-CM | POA: Diagnosis not present

## 2019-12-05 DIAGNOSIS — I110 Hypertensive heart disease with heart failure: Secondary | ICD-10-CM | POA: Diagnosis not present

## 2019-12-05 DIAGNOSIS — D125 Benign neoplasm of sigmoid colon: Secondary | ICD-10-CM | POA: Diagnosis not present

## 2019-12-05 DIAGNOSIS — M1990 Unspecified osteoarthritis, unspecified site: Secondary | ICD-10-CM | POA: Insufficient documentation

## 2019-12-05 DIAGNOSIS — I252 Old myocardial infarction: Secondary | ICD-10-CM | POA: Insufficient documentation

## 2019-12-05 DIAGNOSIS — Z955 Presence of coronary angioplasty implant and graft: Secondary | ICD-10-CM | POA: Diagnosis not present

## 2019-12-05 DIAGNOSIS — I2510 Atherosclerotic heart disease of native coronary artery without angina pectoris: Secondary | ICD-10-CM | POA: Insufficient documentation

## 2019-12-05 DIAGNOSIS — K648 Other hemorrhoids: Secondary | ICD-10-CM | POA: Diagnosis not present

## 2019-12-05 DIAGNOSIS — Z09 Encounter for follow-up examination after completed treatment for conditions other than malignant neoplasm: Secondary | ICD-10-CM | POA: Insufficient documentation

## 2019-12-05 DIAGNOSIS — K621 Rectal polyp: Secondary | ICD-10-CM | POA: Diagnosis not present

## 2019-12-05 DIAGNOSIS — K635 Polyp of colon: Secondary | ICD-10-CM

## 2019-12-05 DIAGNOSIS — J449 Chronic obstructive pulmonary disease, unspecified: Secondary | ICD-10-CM | POA: Insufficient documentation

## 2019-12-05 HISTORY — PX: BIOPSY: SHX5522

## 2019-12-05 HISTORY — PX: COLONOSCOPY WITH PROPOFOL: SHX5780

## 2019-12-05 SURGERY — COLONOSCOPY WITH PROPOFOL
Anesthesia: Monitor Anesthesia Care

## 2019-12-05 MED ORDER — LACTATED RINGERS IV SOLN
INTRAVENOUS | Status: DC
  Administered 2019-12-05: 09:00:00 via INTRAVENOUS

## 2019-12-05 MED ORDER — PROPOFOL 500 MG/50ML IV EMUL
INTRAVENOUS | Status: AC
  Filled 2019-12-05: qty 50

## 2019-12-05 MED ORDER — PROPOFOL 500 MG/50ML IV EMUL
INTRAVENOUS | Status: DC | PRN
  Administered 2019-12-05: 150 ug/kg/min via INTRAVENOUS

## 2019-12-05 MED ORDER — PROPOFOL 10 MG/ML IV BOLUS
INTRAVENOUS | Status: DC | PRN
  Administered 2019-12-05 (×3): 20 mg via INTRAVENOUS

## 2019-12-05 SURGICAL SUPPLY — 21 items

## 2019-12-05 NOTE — Anesthesia Preprocedure Evaluation (Addendum)
Anesthesia Evaluation  Patient identified by MRN, date of birth, ID band Patient awake    Reviewed: Allergy & Precautions, NPO status , Patient's Chart, lab work & pertinent test results, reviewed documented beta blocker date and time   History of Anesthesia Complications Negative for: history of anesthetic complications  Airway Mallampati: II  TM Distance: >3 FB Neck ROM: Full    Dental  (+) Teeth Intact   Pulmonary COPD, former smoker,    Pulmonary exam normal        Cardiovascular hypertension, Pt. on medications and Pt. on home beta blockers + CAD, + Past MI, + Cardiac Stents and +CHF  Normal cardiovascular exam     Neuro/Psych PSYCHIATRIC DISORDERS Anxiety Depression negative neurological ROS     GI/Hepatic negative GI ROS, (+)     substance abuse  alcohol use,   Endo/Other  negative endocrine ROS  Renal/GU negative Renal ROS  negative genitourinary   Musculoskeletal negative musculoskeletal ROS (+)   Abdominal   Peds  Hematology negative hematology ROS (+)   Anesthesia Other Findings Echo 2016: EF 35-40%, grade 1 dd, valves unremarkable  Reproductive/Obstetrics                            Anesthesia Physical Anesthesia Plan  ASA: III  Anesthesia Plan: MAC   Post-op Pain Management:    Induction: Intravenous  PONV Risk Score and Plan: 1 and Propofol infusion, TIVA and Treatment may vary due to age or medical condition  Airway Management Planned: Natural Airway, Nasal Cannula and Simple Face Mask  Additional Equipment: None  Intra-op Plan:   Post-operative Plan:   Informed Consent: I have reviewed the patients History and Physical, chart, labs and discussed the procedure including the risks, benefits and alternatives for the proposed anesthesia with the patient or authorized representative who has indicated his/her understanding and acceptance.       Plan Discussed  with:   Anesthesia Plan Comments:        Anesthesia Quick Evaluation

## 2019-12-05 NOTE — Anesthesia Postprocedure Evaluation (Signed)
Anesthesia Post Note  Patient: Kurt [name].  Procedure(s) Performed: COLONOSCOPY WITH PROPOFOL (N/A ) BIOPSY     Patient location during evaluation: Endoscopy Anesthesia Type: MAC Level of consciousness: awake and alert Pain management: pain level controlled Vital Signs Assessment: post-procedure vital signs reviewed and stable Respiratory status: spontaneous breathing, nonlabored ventilation and respiratory function stable Cardiovascular status: blood pressure returned to baseline and stable Postop Assessment: no apparent nausea or vomiting Anesthetic complications: no    Last Vitals:  Vitals:   12/05/19 1120 12/05/19 1130  BP: 116/73 119/84  Pulse: (!) 54 (!) 51  Resp: (!) 22 (!) 21  Temp:    SpO2: 99% 99%    Last Pain:  Vitals:   12/05/19 1130  TempSrc:   PainSc: 0-No pain                 Lidia Collum

## 2019-12-05 NOTE — Transfer of Care (Signed)
Immediate Anesthesia Transfer of Care Note  Patient: Kurt [name].  Procedure(s) Performed: COLONOSCOPY WITH PROPOFOL (N/A ) BIOPSY  Patient Location: PACU and Endoscopy Unit  Anesthesia Type:MAC  Level of Consciousness: awake, alert  and oriented  Airway & Oxygen Therapy: Patient Spontanous Breathing and Patient connected to face mask oxygen  Post-op Assessment: Report given to RN and Post -op Vital signs reviewed and stable  Post vital signs: Reviewed and stable  Last Vitals:  Vitals Value Taken Time  BP 103/79 12/05/19 1113  Temp    Pulse 43 12/05/19 1115  Resp 14 12/05/19 1115  SpO2 91 % 12/05/19 1115  Vitals shown include unvalidated device data.  Last Pain:  Vitals:   12/05/19 0925  TempSrc: Oral  PainSc: 0-No pain         Complications: No apparent anesthesia complications

## 2019-12-05 NOTE — Anesthesia Procedure Notes (Signed)
Procedure Name: MAC Date/Time: 12/05/2019 10:28 AM Performed by: Eben Burow, CRNA Pre-anesthesia Checklist: Patient identified, Emergency Drugs available, Suction available, Patient being monitored and Timeout performed Oxygen Delivery Method: Simple face mask Dental Injury: Teeth and Oropharynx as per pre-operative assessment

## 2019-12-05 NOTE — H&P (Signed)
    P  Chief Complaint:    Polyp surveillance, history of large adenomatous polyp  HPI:    Patient is a 53 y.o. male with colonoscopy in 02/2019 notable for a 20 mm flat polyp in the sigmoid colon, resected with saline inject-left and hot snare then cold forceps with resection of the edges of the polyp for avulsion technique followed by tissue fulguration with the tip of the hot snare.  Resection felt to be complete, and tattoo placed in 3 to 4 cm distal to the resection site.  Additionally, a 2 mm polyp resected from sigmoid by cold forceps and a 6 mm polyp resected from the sigmoid colon by cold snare.  All tubular adenomas.  Moderately tortuous hepatic flexure.  Given resection technique applied to the largest polyp, recommended repeat colonoscopy in 6 months, to be done at [facility].  Today, he states he is otherwise without any GI issues.  No hematochezia, melena, change in bowel habits, abdominal pain, nausea, vomiting, fever, chills.  Tolerating all p.o. intake without issue prior to starting bowel prep.  No issue tolerating bowel prep.    Review of systems:     No chest pain, no SOB, no fevers, no urinary sx   Past Medical History:  Diagnosis Date  . Allergy    mild  . Anginal pain ([place])   . Anxiety   . Arthritis    maybe in hands   . Avascular necrosis of bone of left hip ([place]) 03/24/2016  . Avascular necrosis of bone of right hip ([place]) 01/14/2016  . COPD (chronic obstructive pulmonary disease) ([place])   . Coronary artery disease   . Coronary disease 09/12/2015  . Depression   . History of MI (myocardial infarction) 09/12/2015  . Hyperlipidemia   . Hypertension   . Myocardial infarction ([place])    20 yrs ago   . 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([place])   . Thyroid disease    reports that he has been low in the past but since he has stoped ETOH it has been normal    Patient's surgical history, family medical history, social history, medications  and allergies were all reviewed in Epic    Current Facility-Administered Medications  Medication Dose Route Frequency Provider Last Rate Last Dose  . lactated ringers infusion   Intravenous Continuous Monicka Cyran V, DO        Physical Exam:     There were no vitals taken for this visit.  GENERAL:  Pleasant male in NAD PSYCH: : Cooperative, normal affect EENT:  conjunctiva pink, mucous membranes moist, neck supple without masses CARDIAC:  RRR, no murmur heard, no peripheral edema PULM: Normal respiratory effort, lungs CTA bilaterally, no wheezing ABDOMEN:  Nondistended, soft, nontender. No obvious masses, no hepatomegaly,  normal bowel sounds SKIN:  turgor, no lesions seen Musculoskeletal:  Normal muscle tone, normal strength NEURO: Alert and oriented x 3, no focal neurologic deficits   IMPRESSION and PLAN:     1) History of colon polyps -Repeat colonoscopy today at [facility] -Recommendations regarding ongoing surveillance interval pending today's endoscopic findings.          Lavena Bullion ,DO, FACG 12/05/2019, 9:14 AM

## 2019-12-05 NOTE — Discharge Instructions (Signed)
YOU HAD AN ENDOSCOPIC PROCEDURE TODAY: Refer to the procedure report and other information in the discharge instructions given to you for any specific questions about what was found during the examination. If this information does not answer your questions, please call [place] office at [phone] to clarify.  ° °YOU SHOULD EXPECT: Some feelings of bloating in the abdomen. Passage of more gas than usual. Walking can help get rid of the air that was put into your GI tract during the procedure and reduce the bloating. If you had a lower endoscopy (such as a colonoscopy or flexible sigmoidoscopy) you may notice spotting of blood in your stool or on the toilet paper. Some abdominal soreness may be present for a day or two, also. ° °DIET: Your first meal following the procedure should be a light meal and then it is ok to progress to your normal diet. A half-sandwich or bowl of soup is an example of a good first meal. Heavy or fried foods are harder to digest and may make you feel nauseous or bloated. Drink plenty of fluids but you should avoid alcoholic beverages for 24 hours. If you had a esophageal dilation, please see attached instructions for diet.   ° °ACTIVITY: Your care partner should take you home directly after the procedure. You should plan to take it easy, moving slowly for the rest of the day. You can resume normal activity the day after the procedure however YOU SHOULD NOT DRIVE, use power tools, machinery or perform tasks that involve climbing or major physical exertion for 24 hours (because of the sedation medicines used during the test).  ° °SYMPTOMS TO REPORT IMMEDIATELY: °A gastroenterologist can be reached at any hour. Please call [phone]  for any of the following symptoms:  °Following lower endoscopy (colonoscopy, flexible sigmoidoscopy) °Excessive amounts of blood in the stool  °Significant tenderness, worsening of abdominal pains  °Swelling of the abdomen that is new, acute  °Fever of 100° or  higher  °Following upper endoscopy (EGD, EUS, ERCP, esophageal dilation) °Vomiting of blood or coffee ground material  °New, significant abdominal pain  °New, significant chest pain or pain under the shoulder blades  °Painful or persistently difficult swallowing  °New shortness of breath  °Black, tarry-looking or red, bloody stools ° °FOLLOW UP:  °If any biopsies were taken you will be contacted by phone or by letter within the next 1-3 weeks. Call [phone]  if you have not heard about the biopsies in 3 weeks.  °Please also call with any specific questions about appointments or follow up tests. ° °

## 2019-12-05 NOTE — Interval H&P Note (Signed)
History and Physical Interval Note:  12/05/2019 10:23 AM  Kurt [name].  has presented today for surgery, with the diagnosis of 6 month repeat colon after piecemeal polypectomy/.  The various methods of treatment have been discussed with the patient and family. After consideration of risks, benefits and other options for treatment, the patient has consented to  Procedure(s): COLONOSCOPY WITH PROPOFOL (N/A) as a surgical intervention.  The patient's history has been reviewed, patient examined, no change in status, stable for surgery.  I have reviewed the patient's chart and labs.  Questions were answered to the patient's satisfaction.     Dominic Pea Adrik Khim

## 2019-12-05 NOTE — Op Note (Signed)
[facility] Patient Name: Kurt [name] Procedure Date: 12/05/2019 MRN: [id number] Attending MD: Gerrit Heck , MD Date of Birth: 01/03/1966 CSN: [id number] Age: 53 Admit Type: Outpatient Procedure:                Colonoscopy Indications:              Surveillance: Personal history of piecemeal removal                            of adenoma on last colonoscopy (less than 1 year                            ago)                           53 yo male with colonoscopy in 02/2019 notable for a                            20 mm flat polyp in the sigmoid colon, resected                            with saline inject?"left and hot snare then cold                            forceps with resection of the edges of the polyp                            for avulsion technique followed by tissue                            fulguration with the tip of the hot snare.                            Resection felt to be complete, and tattoo placed in                            3 to 4 cm distal to the resection site.                            Additionally, a 2 mm polyp resected from sigmoid by                            cold forceps and a 6 mm polyp resected from the                            sigmoid colon by cold snare. All tubular adenomas.                            Moderately tortuous hepatic flexure. Given                            resection technique applied to the largest polyp,  recommended repeat colonoscopy in 6 months. Providers:                Gerrit Heck, MD, Jobe Igo, RN,                            [facility] CRNA, Cherylynn Ridges, Technician Referring MD:              Medicines:                Monitored Anesthesia Care Complications:            No immediate complications. Estimated Blood Loss:     Estimated blood loss was minimal. Procedure:                Pre-Anesthesia Assessment:                           - Prior to the procedure, a  History and Physical                            was performed, and patient medications and                            allergies were reviewed. The patient's tolerance of                            previous anesthesia was also reviewed. The risks                            and benefits of the procedure and the sedation                            options and risks were discussed with the patient.                            All questions were answered, and informed consent                            was obtained. Prior Anticoagulants: The patient has                            taken no previous anticoagulant or antiplatelet                            agents. ASA Grade Assessment: II - A patient with                            mild systemic disease. After reviewing the risks                            and benefits, the patient was deemed in                            satisfactory condition to undergo the procedure.  After obtaining informed consent, the colonoscope                            was passed under direct vision. Throughout the                            procedure, the patient's blood pressure, pulse, and                            oxygen saturations were monitored continuously. The                            [id number] [id number]) Olympus colonoscope was                            introduced through the anus and advanced to the the                            cecum, identified by appendiceal orifice and                            ileocecal valve. The colonoscopy was performed                            without difficulty. The patient tolerated the                            procedure well. The quality of the bowel                            preparation was good. The ileocecal valve,                            appendiceal orifice, and rectum were photographed. Scope In: 10:35:49 AM Scope Out: 11:02:31 AM Scope Withdrawal Time: 0 hours 22 minutes 8 seconds  Total  Procedure Duration: 0 hours 26 minutes 42 seconds  Findings:      The perianal and digital rectal examinations were normal.      Three sessile polyps were found in the rectum and sigmoid colon. The       polyps were 3 to 4 mm in size. These polyps were removed with a cold       snare. Resection and retrieval were complete. Estimated blood loss was       minimal.      A tattoo was seen in the sigmoid colon. The tattoo site appeared normal.       Evaluated the area surrounding the tattoo multiple times. No       post-polypectomy scar noted. No residual adenomatous tissue.      Non-bleeding internal hemorrhoids were found during retroflexion. The       hemorrhoids were small.      The exam was otherwise normal throughout the remainder of the colon. Impression:               - Three 3 to 4 mm polyps in the rectum and in the  sigmoid colon, removed with a cold snare. Resected                            and retrieved.                           - A tattoo was seen in the sigmoid colon. The                            tattoo site appeared normal.                           - Non-bleeding internal hemorrhoids. Moderate Sedation:      Not Applicable - Patient had care per Anesthesia. Recommendation:           - Patient has a contact number available for                            emergencies. The signs and symptoms of potential                            delayed complications were discussed with the                            patient. Return to normal activities tomorrow.                            Written discharge instructions were provided to the                            patient.                           - Resume previous diet.                           - Continue present medications.                           - Await pathology results.                           - Repeat colonoscopy in 3 years for surveillance.                           - Return to GI office PRN.                            - Use fiber, for example Citrucel, Fibercon, Konsyl                            or Metamucil.                           - Internal hemorrhoids were noted on this study and  may be amenable to hemorrhoid band ligation. If you                            are interested in further treatment of these                            hemorrhoids with band ligation, please contact my                            clinic to set up an appointment for evaluation and                            treatment. Procedure Code(s):        --- Professional ---                           [phone], Colonoscopy, flexible; with removal of                            tumor(s), polyp(s), or other lesion(s) by snare                            technique Diagnosis Code(s):        --- Professional ---                           K62.1, Rectal polyp                           K63.5, Polyp of colon                           K64.8, Other hemorrhoids                           Z09, Encounter for follow-up examination after                            completed treatment for conditions other than                            malignant neoplasm                           Z86.010, Personal history of colonic polyps CPT copyright 2019 American [employer] Association. All rights reserved. The codes documented in this report are preliminary and upon coder review may  be revised to meet current compliance requirements. Gerrit Heck, MD 12/05/2019 11:15:37 AM Number of Addenda: 0

## 2019-12-06 LAB — SURGICAL PATHOLOGY

## 2019-12-07 ENCOUNTER — Encounter: Payer: Self-pay | Admitting: Gastroenterology

## 2019-12-08 ENCOUNTER — Encounter: Payer: Self-pay | Admitting: *Deleted

## 2020-01-03 ENCOUNTER — Telehealth: Payer: Self-pay | Admitting: Cardiovascular Disease

## 2020-01-03 NOTE — Telephone Encounter (Signed)

## 2020-01-05 ENCOUNTER — Telehealth (INDEPENDENT_AMBULATORY_CARE_PROVIDER_SITE_OTHER): Payer: 59 | Admitting: Family

## 2020-01-05 ENCOUNTER — Other Ambulatory Visit: Payer: Self-pay

## 2020-01-05 ENCOUNTER — Encounter: Payer: Self-pay | Admitting: Family

## 2020-01-05 VITALS — BP 135/80 | HR 60 | Ht 73.0 in | Wt 178.0 lb

## 2020-01-05 DIAGNOSIS — I426 Alcoholic cardiomyopathy: Secondary | ICD-10-CM | POA: Diagnosis not present

## 2020-01-05 DIAGNOSIS — I252 Old myocardial infarction: Secondary | ICD-10-CM | POA: Diagnosis not present

## 2020-01-05 DIAGNOSIS — I25118 Atherosclerotic heart disease of native coronary artery with other forms of angina pectoris: Secondary | ICD-10-CM | POA: Diagnosis not present

## 2020-01-05 DIAGNOSIS — I10 Essential (primary) hypertension: Secondary | ICD-10-CM

## 2020-01-05 MED ORDER — CARVEDILOL 6.25 MG PO TABS: 6.2500 mg | ORAL_TABLET | Freq: Two times a day (BID) | ORAL | 3 refills | Status: DC

## 2020-01-05 MED ORDER — ASPIRIN EC 81 MG PO TBEC: 81.0000 mg | DELAYED_RELEASE_TABLET | Freq: Every day | ORAL | 3 refills | Status: AC

## 2020-01-05 NOTE — Progress Notes (Signed)
Virtual Visit via Telephone Note   This visit type was conducted due to national recommendations for restrictions regarding the COVID-19 Pandemic (e.g. social distancing) in an effort to limit this patient's exposure and mitigate transmission in our community.  Due to his co-morbid illnesses, this patient is at least at moderate risk for complications without adequate follow up.  This format is felt to be most appropriate for this patient at this time.  The patient did not have access to video technology/had technical difficulties with video requiring transitioning to audio format only (telephone).  All issues noted in this document were discussed and addressed.  No physical exam could be performed with this format.  Please refer to the patient's chart for his  consent to telehealth for [facility].   Date:  01/05/2020   ID:  Kurt Heir., DOB 09/24/1966, MRN [id number]  Patient Location: Home Provider Location: Home  PCP:  Pleas Koch, NP  Cardiologist:  Ida Rogue, MD  Electrophysiologist:  None   Evaluation Performed:  Follow-Up Visit  Chief Complaint:  Overdue follow up of alcoholic cardiomyopathy  History of Present Illness:    Kurt [name]. is a 54 y.o. male with history of CAD (2 stents likely to LAD and mid to distal region in [id number]), alcoholic cardiomyopathy.  Per previous documentation stress test with no significant ischemia, possible small fixed perfusion defect in apical region, low EF.  Confirmed by echo 10/2015 with EF 35 to 40%.  Last seen by Dr. Arta Bruce 01/13/2018.  Tells me he quit smoking and drinking a couple years ago.  Congratulated him on this.  Tells me he enjoys being active by walking every day with the dogs.  And on the weekends he and his significant other will take them on around 3 mile walks in the woods.  Reports DOE is some better than when last seen by Dr. Rockey Situ likely due to increased exercise levels.. Denies edema, orthopnea,  PND.  Checks his blood pressure intermittently at home. Checks more often at the pharmacy and reports it is 130/70-80 typically.   The patient does not have symptoms concerning for COVID-19 infection (fever, chills, cough, or new shortness of breath).    Past Medical History:  Diagnosis Date  . Allergy    mild  . Anginal pain ([place])   . Anxiety   . Arthritis    maybe in hands   . Avascular necrosis of bone of left hip ([place]) 03/24/2016  . Avascular necrosis of bone of right hip ([place]) 01/14/2016  . COPD (chronic obstructive pulmonary disease) ([place])   . Coronary artery disease   . Coronary disease 09/12/2015  . Depression   . History of MI (myocardial infarction) 09/12/2015  . Hyperlipidemia   . Hypertension   . Myocardial infarction ([place])    20 yrs ago   . 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([place])   . Thyroid disease    reports that he has been low in the past but since he has stoped ETOH it has been normal   Past Surgical History:  Procedure Laterality Date  . BIOPSY  12/05/2019   Procedure: BIOPSY;  Surgeon: Lavena Bullion, DO;  Location: [facility];  Service: Gastroenterology;;  . CARDIAC CATHETERIZATION     many years ago in 1990's  . COLONOSCOPY  03/02/2019  . COLONOSCOPY WITH PROPOFOL N/A 12/05/2019   Procedure: COLONOSCOPY WITH PROPOFOL;  Surgeon: Lavena Bullion, DO;  Location: [facility];  Service: Gastroenterology;  Laterality: N/A;  . CORONARY STENT PLACEMENT    . TONSILLECTOMY     as a child  . TOTAL HIP ARTHROPLASTY Right 01/14/2016   Procedure: TOTAL HIP ARTHROPLASTY;  Surgeon: [name], MD;  Location: [place];  Service: Orthopedics;  Laterality: Right;  . TOTAL HIP ARTHROPLASTY Left 03/24/2016   Procedure: TOTAL HIP ARTHROPLASTY;  Surgeon: [name], MD;  Location: [place];  Service: Orthopedics;  Laterality: Left;     Current Meds  Medication Sig  . aspirin 325 MG tablet Take 325 mg by mouth daily.  Marland Kitchen atorvastatin  (LIPITOR) 20 MG tablet Take 1 tablet by moth daily in the evening NEED APPOINTMENT FOR ANY MORE REFILLS (Patient taking differently: Take 20 mg by mouth daily. )  . buPROPion (WELLBUTRIN XL) 150 MG 24 hr tablet Take 1 tablet by mouth once daily for anxiety and depression. (Patient taking differently: Take 150 mg by mouth daily. )  . carvedilol (COREG) 6.25 MG tablet Take 1 tablet (6.25 mg total) by mouth 2 (two) times daily with a meal.  . escitalopram (LEXAPRO) 10 MG tablet Take 1 tablet by mouth once daily for anxiety and depression. (Patient taking differently: Take 10 mg by mouth daily. )  . Fluticasone-Salmeterol (ADVAIR) 250-50 MCG/DOSE AEPB INHALE 1 PUFF INTO THE LUNGS  TWICE DAILY (Patient taking differently: Inhale 1 puff into the lungs 2 (two) times daily as needed (shortness of breath). )  . lisinopril (PRINIVIL,ZESTRIL) 20 MG tablet Take 1 tablet by mouth once daily for blood pressure. (Patient taking differently: Take 20 mg by mouth daily. )  . Multiple Vitamin (MULTIVITAMIN WITH MINERALS) TABS tablet Take 1 tablet by mouth daily.  . Omega-3 Fatty Acids (FISH OIL) 1000 MG CAPS Take 1,000 mg by mouth daily.   . VENTOLIN HFA 108 (90 Base) MCG/ACT inhaler INHALE 2 PUFFS BY MOUTH EVERY 4 HOURS AS NEEDED FOR WHEEZING OR COUGH (Patient taking differently: Inhale 2 puffs into the lungs every 4 (four) hours as needed for wheezing or shortness of breath. )  . [DISCONTINUED] carvedilol (COREG) 6.25 MG tablet Take 1 tablet (6.25 mg total) by mouth 2 (two) times daily with a meal. (Patient taking differently: Take 6.25 mg by mouth daily. )     Allergies:   Peanut-containing drug products and Erythromycin   Social History   Tobacco Use  . Smoking status: Former Smoker    Packs/day: 1.50    Years: 20.00    Pack years: 30.00    Types: Cigarettes    Quit date: 11/25/2015    Years since quitting: 4.1  . Smokeless tobacco: Never Used  Substance Use Topics  . Alcohol use: No    Alcohol/week: 0.0  standard drinks    Comment: 4 months-recovering alcoholic per pt  . Drug use: No     Family Hx: The patient's family history includes Cancer in his maternal grandfather, maternal grandmother, mother, paternal grandfather, and paternal grandmother; Colon cancer (age of onset: 38) in his brother; Dementia in his father. There is no history of Colon polyps, Esophageal cancer, Rectal cancer, or Stomach cancer.  ROS:   Please see the history of present illness.    Review of Systems  Constitution: Negative for chills, fever and malaise/fatigue.  Cardiovascular: Positive for dyspnea on exertion. Negative for chest pain, leg swelling, near-syncope, orthopnea, palpitations and syncope.  Respiratory: Negative for cough, shortness of breath and wheezing.   Gastrointestinal: Negative for nausea and vomiting.  Neurological: Negative for  dizziness, light-headedness and weakness.   All other systems reviewed and are negative.   Prior CV studies:   The following studies were reviewed today: Echocardiogram 10/2015 Left ventricle: The cavity size was normal. There was mild   concentric hypertrophy. Systolic function was moderately reduced.   The estimated ejection fraction was in the range of 35% to 40%.   Diffuse hypokinesis. Severe hypokinesis of the apical myocardium.   Doppler parameters are consistent with abnormal left ventricular   relaxation (grade 1 diastolic dysfunction). - Left atrium: The atrium was normal in size. - Right ventricle: Systolic function was normal. - Pulmonary arteries: Systolic pressure was within the normal   range.  Labs/Other Tests and Data Reviewed:    EKG:  No ECG reviewed.  Recent Labs: 02/02/2019: ALT 24; BUN 19; Creatinine, Ser 0.97; Potassium 4.6; Sodium 136; TSH 1.72   Recent Lipid Panel Lab Results  Component Value Date/Time   CHOL 246 (H) 02/02/2019 08:43 AM   TRIG 182.0 (H) 02/02/2019 08:43 AM   HDL 51.90 02/02/2019 08:43 AM   CHOLHDL 5 02/02/2019  08:43 AM   LDLCALC 158 (H) 02/02/2019 08:43 AM    Wt Readings from Last 3 Encounters:  01/05/20 178 lb (80.7 kg)  12/05/19 170 lb (77.1 kg)  03/02/19 185 lb (83.9 kg)     Objective:    Vital Signs:  BP 135/80   Pulse 60   Ht 6\' 1"  (1.854 m)   Wt 178 lb (80.7 kg)   BMI 23.48 kg/m    VITAL SIGNS:  reviewed  ASSESSMENT & PLAN:    1. Alcoholic cardiomyopathy - Quit drinking approximately 2 years ago, Advertising account executive.  Last echo 10/2015 with EF 35 to 40%.  Plan to update echocardiogram.  Continue carvedilol 6.25 mg twice daily, lisinopril 20 mg daily.  2. HLD -follows with his PCP.  Would recommend LDL goal less than 70.  Anticipates repeat labs in February for his annual wellness visit.  If he is not at goal with atorvastatin 20 mg daily -consider transition to Crestor and/or addition of Zetia.  3. CAD -remote history with no anginal symptoms.  GDMT includes aspirin, beta-blocker, statin, PRN nitroglycerin.  He has been taking full-strength aspirin and is not certain why this was initially started.  He will change to 81 mg aspirin.  COVID-19 Education: The signs and symptoms of COVID-19 were discussed with the patient and how to seek care for testing (follow up with PCP or arrange E-visit).  The importance of social distancing was discussed today.  Time:   Today, I have spent 15 minutes with the patient with telehealth technology discussing the above problems.     Medication Adjustments/Labs and Tests Ordered: Current medicines are reviewed at length with the patient today.  Concerns regarding medicines are outlined above.   Tests Ordered: Orders Placed This Encounter  Procedures  . ECHOCARDIOGRAM COMPLETE    Medication Changes: Meds ordered this encounter  Medications  . carvedilol (COREG) 6.25 MG tablet    Sig: Take 1 tablet (6.25 mg total) by mouth 2 (two) times daily with a meal.    Dispense:  180 tablet    Refill:  3    Order Specific Question:   Supervising  Provider    Answer:   Richardo Priest [id number]    Follow Up: Echocardiogram.  If echocardiogram is with acute findings we will schedule sooner office visit.  Otherwise follow-up In Person in 1 year(s) with Dr. Rockey Situ.  Signed, Loel Dubonnet,  NP  01/05/2020 5:05 PM    [facility]

## 2020-01-05 NOTE — Patient Instructions (Addendum)
Medication Instructions:  Your physician has recommended you make the following change in your medication:   Take Carvedilol (Coreg) 6.25mg  twice daily  *If you need a refill on your cardiac medications before your next appointment, please call your pharmacy*  Lab Work: None ordered today.  Testing/Procedures: Your physician has requested that you have an echocardiogram. Echocardiography is a painless test that uses sound waves to create images of your heart. It provides your doctor with information about the size and shape of your heart and how well your heart's chambers and valves are working. This procedure takes approximately one hour. There are no restrictions for this procedure.  Follow-Up: At [facility], you and your health needs are our priority.  As part of our continuing mission to provide you with exceptional heart care, we have created designated Provider Care Teams.  These Care Teams include your primary Cardiologist (physician) and Advanced Practice Providers (APPs -  Physician Assistants and Nurse Practitioners) who all work together to provide you with the care you need, when you need it.  Your next appointment:   1 year(s)  The format for your next appointment:   In Person  Provider:    You may see Ida Rogue, MD or one of the following Advanced Practice Providers on your designated Care Team:    Murray Hodgkins, NP  Christell Faith, PA-C  Marrianne Mood, PA-C  Other Instructions   DASH Eating Plan DASH stands for "Dietary Approaches to Stop Hypertension." The DASH eating plan is a healthy eating plan that has been shown to reduce high blood pressure (hypertension). It may also reduce your risk for type 2 diabetes, heart disease, and stroke. The DASH eating plan may also help with weight loss. What are tips for following this plan?  General guidelines  Avoid eating more than 2,300 mg (milligrams) of salt (sodium) a day. If you have hypertension, you may  need to reduce your sodium intake to 1,500 mg a day.  Limit alcohol intake to no more than 1 drink a day for nonpregnant women and 2 drinks a day for men. One drink equals 12 oz of beer, 5 oz of wine, or 1 oz of hard liquor.  Work with your health care provider to maintain a healthy body weight or to lose weight. Ask what an ideal weight is for you.  Get at least 30 minutes of exercise that causes your heart to beat faster (aerobic exercise) most days of the week. Activities may include walking, swimming, or biking.  Work with your health care provider or diet and nutrition specialist (dietitian) to adjust your eating plan to your individual calorie needs. Reading food labels   Check food labels for the amount of sodium per serving. Choose foods with less than 5 percent of the Daily Value of sodium. Generally, foods with less than 300 mg of sodium per serving fit into this eating plan.  To find whole grains, look for the word "whole" as the first word in the ingredient list. Shopping  Buy products labeled as "low-sodium" or "no salt added."  Buy fresh foods. Avoid canned foods and premade or frozen meals. Cooking  Avoid adding salt when cooking. Use salt-free seasonings or herbs instead of table salt or sea salt. Check with your health care provider or pharmacist before using salt substitutes.  Do not fry foods. Cook foods using healthy methods such as baking, boiling, grilling, and broiling instead.  Cook with heart-healthy oils, such as olive, canola, soybean, or sunflower  oil. Meal planning  Eat a balanced diet that includes: ? 5 or more servings of fruits and vegetables each day. At each meal, try to fill half of your plate with fruits and vegetables. ? Up to 6-8 servings of whole grains each day. ? Less than 6 oz of lean meat, poultry, or fish each day. A 3-oz serving of meat is about the same size as a deck of cards. One egg equals 1 oz. ? 2 servings of low-fat dairy each  day. ? A serving of nuts, seeds, or beans 5 times each week. ? Heart-healthy fats. Healthy fats called Omega-3 fatty acids are found in foods such as flaxseeds and coldwater fish, like sardines, salmon, and mackerel.  Limit how much you eat of the following: ? Canned or prepackaged foods. ? Food that is high in trans fat, such as fried foods. ? Food that is high in saturated fat, such as fatty meat. ? Sweets, desserts, sugary drinks, and other foods with added sugar. ? Full-fat dairy products.  Do not salt foods before eating.  Try to eat at least 2 vegetarian meals each week.  Eat more home-cooked food and less restaurant, buffet, and fast food.  When eating at a restaurant, ask that your food be prepared with less salt or no salt, if possible. What foods are recommended? The items listed may not be a complete list. Talk with your dietitian about what dietary choices are best for you. Grains Whole-grain or whole-wheat bread. Whole-grain or whole-wheat pasta. Brown rice. [place]. Bulgur. Whole-grain and low-sodium cereals. Pita bread. Low-fat, low-sodium crackers. Whole-wheat flour tortillas. Vegetables Fresh or frozen vegetables (raw, steamed, roasted, or grilled). Low-sodium or reduced-sodium tomato and vegetable juice. Low-sodium or reduced-sodium tomato sauce and tomato paste. Low-sodium or reduced-sodium canned vegetables. Fruits All fresh, dried, or frozen fruit. Canned fruit in natural juice (without added sugar). Meat and other protein foods Skinless chicken or [place]. Ground chicken or [place]. Pork with fat trimmed off. Fish and seafood. Egg whites. Dried beans, peas, or lentils. Unsalted nuts, nut butters, and seeds. Unsalted canned beans. Lean cuts of beef with fat trimmed off. Low-sodium, lean deli meat. Dairy Low-fat (1%) or fat-free (skim) milk. Fat-free, low-fat, or reduced-fat cheeses. Nonfat, low-sodium ricotta or cottage cheese. Low-fat or nonfat yogurt.  Low-fat, low-sodium cheese. Fats and oils Soft margarine without trans fats. Vegetable oil. Low-fat, reduced-fat, or light mayonnaise and salad dressings (reduced-sodium). Canola, safflower, olive, soybean, and sunflower oils. Avocado. Seasoning and other foods Herbs. Spices. Seasoning mixes without salt. Unsalted popcorn and pretzels. Fat-free sweets. What foods are not recommended? The items listed may not be a complete list. Talk with your dietitian about what dietary choices are best for you. Grains Baked goods made with fat, such as croissants, muffins, or some breads. Dry pasta or rice meal packs. Vegetables Creamed or fried vegetables. Vegetables in a cheese sauce. Regular canned vegetables (not low-sodium or reduced-sodium). Regular canned tomato sauce and paste (not low-sodium or reduced-sodium). Regular tomato and vegetable juice (not low-sodium or reduced-sodium). Angie Fava. Olives. Fruits Canned fruit in a light or heavy syrup. Fried fruit. Fruit in cream or butter sauce. Meat and other protein foods Fatty cuts of meat. Ribs. Fried meat. Berniece Salines. Sausage. Bologna and other processed lunch meats. Salami. Fatback. Hotdogs. Bratwurst. Salted nuts and seeds. Canned beans with added salt. Canned or smoked fish. Whole eggs or egg yolks. Chicken or [place] with skin. Dairy Whole or 2% milk, cream, and half-and-half. Whole or full-fat cream cheese.  Whole-fat or sweetened yogurt. Full-fat cheese. Nondairy creamers. Whipped toppings. Processed cheese and cheese spreads. Fats and oils Butter. Stick margarine. Lard. Shortening. Ghee. Bacon fat. Tropical oils, such as coconut, palm kernel, or palm oil. Seasoning and other foods Salted popcorn and pretzels. Onion salt, garlic salt, seasoned salt, table salt, and sea salt. Worcestershire sauce. Tartar sauce. Barbecue sauce. Teriyaki sauce. Soy sauce, including reduced-sodium. Steak sauce. Canned and packaged gravies. Fish sauce. Oyster sauce. Cocktail  sauce. Horseradish that you find on the shelf. Ketchup. Mustard. Meat flavorings and tenderizers. Bouillon cubes. Hot sauce and Tabasco sauce. Premade or packaged marinades. Premade or packaged taco seasonings. Relishes. Regular salad dressings. Where to find more information:  National Heart, Lung, and [place]: https://wilson-eaton.com/  American Heart Association: www.heart.org Summary  The DASH eating plan is a healthy eating plan that has been shown to reduce high blood pressure (hypertension). It may also reduce your risk for type 2 diabetes, heart disease, and stroke.  With the DASH eating plan, you should limit salt (sodium) intake to 2,300 mg a day. If you have hypertension, you may need to reduce your sodium intake to 1,500 mg a day.  When on the DASH eating plan, aim to eat more fresh fruits and vegetables, whole grains, lean proteins, low-fat dairy, and heart-healthy fats.  Work with your health care provider or diet and nutrition specialist (dietitian) to adjust your eating plan to your individual calorie needs. This information is not intended to replace advice given to you by your health care provider. Make sure you discuss any questions you have with your health care provider. Document Revised: 11/26/2017 Document Reviewed: 12/07/2016 Elsevier Patient Education  2020 [employer].

## 2020-02-07 ENCOUNTER — Telehealth: Payer: Self-pay | Admitting: Primary Care

## 2020-02-07 DIAGNOSIS — E785 Hyperlipidemia, unspecified: Secondary | ICD-10-CM

## 2020-02-07 MED ORDER — ATORVASTATIN CALCIUM 20 MG PO TABS: ORAL_TABLET | ORAL | 0 refills | Status: DC

## 2020-02-07 NOTE — Telephone Encounter (Signed)
Patient has moved. He needs a new rx for Atorvastatin sent to [facility].

## 2020-02-07 NOTE — Telephone Encounter (Signed)
Refill as requested. Need appointment for any more refills

## 2020-03-12 ENCOUNTER — Encounter: Payer: Self-pay | Admitting: Family Medicine

## 2020-03-12 ENCOUNTER — Other Ambulatory Visit: Payer: Self-pay | Admitting: Primary Care

## 2020-03-12 ENCOUNTER — Other Ambulatory Visit: Payer: Self-pay

## 2020-03-12 ENCOUNTER — Ambulatory Visit (INDEPENDENT_AMBULATORY_CARE_PROVIDER_SITE_OTHER): Payer: 59 | Admitting: Family Medicine

## 2020-03-12 VITALS — BP 130/75 | HR 70 | Ht 73.0 in | Wt 185.0 lb

## 2020-03-12 DIAGNOSIS — F419 Anxiety disorder, unspecified: Secondary | ICD-10-CM

## 2020-03-12 DIAGNOSIS — R194 Change in bowel habit: Secondary | ICD-10-CM

## 2020-03-12 DIAGNOSIS — E039 Hypothyroidism, unspecified: Secondary | ICD-10-CM

## 2020-03-12 DIAGNOSIS — R197 Diarrhea, unspecified: Secondary | ICD-10-CM

## 2020-03-12 DIAGNOSIS — F329 Major depressive disorder, single episode, unspecified: Secondary | ICD-10-CM

## 2020-03-12 DIAGNOSIS — R5383 Other fatigue: Secondary | ICD-10-CM | POA: Diagnosis not present

## 2020-03-12 DIAGNOSIS — E349 Endocrine disorder, unspecified: Secondary | ICD-10-CM | POA: Diagnosis not present

## 2020-03-12 DIAGNOSIS — E785 Hyperlipidemia, unspecified: Secondary | ICD-10-CM

## 2020-03-12 DIAGNOSIS — F32A Depression, unspecified: Secondary | ICD-10-CM

## 2020-03-12 HISTORY — DX: Change in bowel habit: R19.4

## 2020-03-12 NOTE — Progress Notes (Signed)
Virtual visit attempted through [employer]. Due to national recommendations of social distancing due to COVID-19, a virtual visit is felt to be most appropriate for this patient at this time. Reviewed limitations of a virtual visit.   Interactive audio and video telecommunications were attempted between myself and Won Sipin., however failed due to patient having technical difficulties OR patient not having access to video capability.  We continued and completed visit with audio only.  Time: 8:50am - 9:10am   Patient location: at store  Provider location: [place] [facility], office If any vitals were documented, they were collected by patient at home unless specified below.    BP 130/75   Pulse 70   Ht 6\' 1"  (1.854 m)   Wt 185 lb (83.9 kg)   BMI 24.41 kg/m    CC: fatigue, GI trouble Subjective:    Patient ID: Kurt Heir., male    DOB: Jan 07, 1966, 54 y.o.   MRN: [id number]  HPI: Kurt Gladbach. is a 54 y.o. male presenting on 03/12/2020 for Fatigue (C/o feeling tired all the time.  Started about 3 wks ago, worsening.  Checked for COVID 2x, neg. ) and GI Problem (C/o episodes of constipation.  Then when able to have BM, it is really loose.  Started about 3 wks ago.)   3 wk h/o fatigue, sluggish feeling, daytime somnolence (has to take naps during the day), loose stool diarrhea alternating with sticky stool, normal brown color. Increasing gas noted. Abdominal cramping with bowel changes. Weight gain noted. Appetite ok. No blood in stool. No black stools. No diet changes recently. Kind of feels like when he had hypothyroidism, s/p treatment at clinic in [place] about 3 yrs ago.  Tested negative for covid x2.  Not around any sick contacts recently.   No fevers/chills, cough, dyspnea, abd pain, nausea, ST, loss of taste or smell. Heat or cold intolerance. No urinary symptoms. No significant HA, lightheadedness, presyncope.   H/o hypothyroidism not currently on  medication.  H/o low T s/p treatment with weekly testosterone shots.   Depression stable on wellbutrin XL 150mg  and lexapro 10mg  daily.  Sex drive ok.   Colonoscopy 11/2019 - TA, rpt 3 yrs ([facility]) No recent med changes.      Relevant past medical, surgical, family and social history reviewed and updated as indicated. Interim medical history since our last visit reviewed. Allergies and medications reviewed and updated. Outpatient Medications Prior to Visit  Medication Sig Dispense Refill  . aspirin EC 81 MG tablet Take 1 tablet (81 mg total) by mouth daily. 90 tablet 3  . atorvastatin (LIPITOR) 20 MG tablet Take 1 tablet by moth daily in the evening NEED APPOINTMENT FOR ANY MORE REFILLS 30 tablet 0  . buPROPion (WELLBUTRIN XL) 150 MG 24 hr tablet Take 1 tablet by mouth once daily for anxiety and depression. (Patient taking differently: Take 150 mg by mouth daily. ) 90 tablet 3  . carvedilol (COREG) 6.25 MG tablet Take 1 tablet (6.25 mg total) by mouth 2 (two) times daily with a meal. 180 tablet 3  . escitalopram (LEXAPRO) 10 MG tablet Take 1 tablet by mouth once daily for anxiety and depression. (Patient taking differently: Take 10 mg by mouth daily. ) 90 tablet 3  . Fluticasone-Salmeterol (ADVAIR) 250-50 MCG/DOSE AEPB INHALE 1 PUFF INTO THE LUNGS  TWICE DAILY (Patient taking differently: Inhale 1 puff into the lungs 2 (two) times daily as needed (shortness of breath). ) 60 each  1  . lisinopril (PRINIVIL,ZESTRIL) 20 MG tablet Take 1 tablet by mouth once daily for blood pressure. (Patient taking differently: Take 20 mg by mouth daily. ) 90 tablet 3  . Multiple Vitamin (MULTIVITAMIN WITH MINERALS) TABS tablet Take 1 tablet by mouth daily.    . Omega-3 Fatty Acids (FISH OIL) 1000 MG CAPS Take 1,000 mg by mouth daily.     . VENTOLIN HFA 108 (90 Base) MCG/ACT inhaler INHALE 2 PUFFS BY MOUTH EVERY 4 HOURS AS NEEDED FOR WHEEZING OR COUGH (Patient taking differently: Inhale 2 puffs into the lungs  every 4 (four) hours as needed for wheezing or shortness of breath. ) 18 each 0  . nitroGLYCERIN (NITROSTAT) 0.4 MG SL tablet Place 1 tablet (0.4 mg total) under the tongue every 5 (five) minutes as needed for chest pain. 100 tablet 0   No facility-administered medications prior to visit.     Per HPI unless specifically indicated in ROS section below Review of Systems Objective:    BP 130/75   Pulse 70   Ht 6\' 1"  (1.854 m)   Wt 185 lb (83.9 kg)   BMI 24.41 kg/m   Wt Readings from Last 3 Encounters:  03/12/20 185 lb (83.9 kg)  01/05/20 178 lb (80.7 kg)  12/05/19 170 lb (77.1 kg)     Physical exam: Gen: alert, NAD, not ill appearing Pulm: speaks in complete sentences without increased work of breathing Psych: normal mood, normal thought content      Assessment & Plan:   Problem List Items Addressed This Visit    Testosterone deficiency   Relevant Orders   Testos,Total,Free and SHBG (Male)   Luteinizing hormone   Follicle Stimulating Hormone   Fatigue - Primary    For 3 wks, has received 2 negative COVID tests. Unclear cause. Advised to come in tomorrow morning for labs to further evaluate for reversible causes.  In low T, will check this as well.       Relevant Orders   Vitamin B12   VITAMIN D 25 Hydroxy (Vit-D Deficiency, Fractures)   Sedimentation rate   Comprehensive metabolic panel   TSH   T4, free   CBC with Differential/Platelet   Bowel habit changes    Endorses alternating loose stools and tarry sticky stools (but not black). UTD colonoscopy (last done 11/2019, reviewed). Worse smelling, associated with cramping. Check C diff. Check labwork when he returns tomorrow.  In interim, rec bland diet and plenty of fluid.       Relevant Orders   Comprehensive metabolic panel   C. difficile GDH and Toxin A/B   Acquired hypothyroidism   Relevant Orders   TSH   T4, free    Other Visit Diagnoses    Diarrhea of presumed infectious origin       Relevant Orders    C. difficile GDH and Toxin A/B       No orders of the defined types were placed in this encounter.  Orders Placed This Encounter  Procedures  . Vitamin B12    Standing Status:   Future    Standing Expiration Date:   03/12/2021  . VITAMIN D 25 Hydroxy (Vit-D Deficiency, Fractures)    Standing Status:   Future    Standing Expiration Date:   03/12/2021  . Testos,Total,Free and SHBG (Male)    Standing Status:   Future    Standing Expiration Date:   03/12/2021  . Luteinizing hormone    Standing Status:   Future  Standing Expiration Date:   03/12/2021  . Follicle Stimulating Hormone    Standing Status:   Future    Standing Expiration Date:   03/12/2021  . Sedimentation rate    Standing Status:   Future    Standing Expiration Date:   03/12/2021  . Comprehensive metabolic panel    Standing Status:   Future    Standing Expiration Date:   03/12/2021  . TSH    Standing Status:   Future    Standing Expiration Date:   03/12/2021  . T4, free    Standing Status:   Future    Standing Expiration Date:   03/12/2021  . CBC with Differential/Platelet    Standing Status:   Future    Standing Expiration Date:   03/12/2021  . C. difficile GDH and Toxin A/B    Standing Status:   Future    Standing Expiration Date:   03/12/2021    I discussed the assessment and treatment plan with the patient. The patient was provided an opportunity to ask questions and all were answered. The patient agreed with the plan and demonstrated an understanding of the instructions. The patient was advised to call back or seek an in-person evaluation if the symptoms worsen or if the condition fails to improve as anticipated.  Follow up plan: No follow-ups on file.  Ria Bush, MD

## 2020-03-12 NOTE — Assessment & Plan Note (Addendum)
Endorses alternating loose stools and tarry sticky stools (but not black). UTD colonoscopy (last done 11/2019, reviewed). Worse smelling, associated with cramping. Check C diff. Check labwork when he returns tomorrow.  In interim, rec bland diet and plenty of fluid.

## 2020-03-12 NOTE — Assessment & Plan Note (Addendum)
For 3 wks, has received 2 negative COVID tests. Unclear cause. Advised to come in tomorrow morning for labs to further evaluate for reversible causes.  In low T, will check this as well.

## 2020-03-13 ENCOUNTER — Other Ambulatory Visit: Payer: Self-pay

## 2020-03-13 ENCOUNTER — Other Ambulatory Visit (INDEPENDENT_AMBULATORY_CARE_PROVIDER_SITE_OTHER): Payer: 59

## 2020-03-13 DIAGNOSIS — E349 Endocrine disorder, unspecified: Secondary | ICD-10-CM

## 2020-03-13 DIAGNOSIS — R194 Change in bowel habit: Secondary | ICD-10-CM

## 2020-03-13 DIAGNOSIS — R5383 Other fatigue: Secondary | ICD-10-CM | POA: Diagnosis not present

## 2020-03-13 DIAGNOSIS — E039 Hypothyroidism, unspecified: Secondary | ICD-10-CM | POA: Diagnosis not present

## 2020-03-13 LAB — CBC WITH DIFFERENTIAL/PLATELET
Basophils Absolute: 0 10*3/uL (ref 0.0–0.1)
Basophils Relative: 0.8 % (ref 0.0–3.0)
Eosinophils Absolute: 0.1 10*3/uL (ref 0.0–0.7)
Eosinophils Relative: 2.1 % (ref 0.0–5.0)
HCT: 40.4 % (ref 39.0–52.0)
Hemoglobin: 13.7 g/dL (ref 13.0–17.0)
Lymphocytes Relative: 35.8 % (ref 12.0–46.0)
Lymphs Abs: 2.2 10*3/uL (ref 0.7–4.0)
MCHC: 33.9 g/dL (ref 30.0–36.0)
MCV: 95.4 fl (ref 78.0–100.0)
Monocytes Absolute: 0.8 10*3/uL (ref 0.1–1.0)
Monocytes Relative: 12.5 % — ABNORMAL HIGH (ref 3.0–12.0)
Neutro Abs: 3 10*3/uL (ref 1.4–7.7)
Neutrophils Relative %: 48.8 % (ref 43.0–77.0)
Platelets: 246 10*3/uL (ref 150.0–400.0)
RBC: 4.24 Mil/uL (ref 4.22–5.81)
RDW: 13.7 % (ref 11.5–15.5)
WBC: 6.1 10*3/uL (ref 4.0–10.5)

## 2020-03-13 LAB — COMPREHENSIVE METABOLIC PANEL
ALT: 17 U/L (ref 0–53)
AST: 18 U/L (ref 0–37)
Albumin: 4 g/dL (ref 3.5–5.2)
Alkaline Phosphatase: 79 U/L (ref 39–117)
BUN: 19 mg/dL (ref 6–23)
CO2: 30 mEq/L (ref 19–32)
Calcium: 9.3 mg/dL (ref 8.4–10.5)
Chloride: 102 mEq/L (ref 96–112)
Creatinine, Ser: 0.97 mg/dL (ref 0.40–1.50)
GFR: 80.23 mL/min (ref >60.00)
Glucose, Bld: 96 mg/dL (ref 70–99)
Potassium: 4.7 mEq/L (ref 3.5–5.1)
Sodium: 137 mEq/L (ref 135–145)
Total Bilirubin: 0.4 mg/dL (ref 0.2–1.2)
Total Protein: 6.6 g/dL (ref 6.0–8.3)

## 2020-03-13 LAB — VITAMIN D 25 HYDROXY (VIT D DEFICIENCY, FRACTURES): VITD: 36 ng/mL (ref 30.00–100.00)

## 2020-03-13 LAB — TSH: TSH: 2 u[IU]/mL (ref 0.35–4.50)

## 2020-03-13 LAB — FOLLICLE STIMULATING HORMONE: FSH: 6.1 m[IU]/mL (ref 1.4–18.1)

## 2020-03-13 LAB — SEDIMENTATION RATE: Sed Rate: 27 mm/hr — ABNORMAL HIGH (ref 0–20)

## 2020-03-13 LAB — LUTEINIZING HORMONE: LH: 3.82 m[IU]/mL (ref 1.50–9.30)

## 2020-03-13 LAB — T4, FREE: Free T4: 0.93 ng/dL (ref 0.60–1.60)

## 2020-03-13 LAB — VITAMIN B12: Vitamin B-12: 460 pg/mL (ref 211–911)

## 2020-03-19 LAB — TESTOS,TOTAL,FREE AND SHBG (FEMALE)
Free Testosterone: 82 pg/mL (ref 35.0–155.0)
Sex Hormone Binding: 51 nmol/L — ABNORMAL HIGH (ref 10–50)
Testosterone, Total, LC-MS-MS: 522 ng/dL (ref 250–1100)

## 2020-03-27 ENCOUNTER — Other Ambulatory Visit (INDEPENDENT_AMBULATORY_CARE_PROVIDER_SITE_OTHER): Payer: 59

## 2020-03-27 DIAGNOSIS — R194 Change in bowel habit: Secondary | ICD-10-CM | POA: Diagnosis not present

## 2020-03-27 DIAGNOSIS — R197 Diarrhea, unspecified: Secondary | ICD-10-CM

## 2020-03-28 LAB — C. DIFFICILE GDH AND TOXIN A/B
GDH ANTIGEN: NOT DETECTED
MICRO NUMBER:: 10313125
SPECIMEN QUALITY:: ADEQUATE
TOXIN A AND B: NOT DETECTED

## 2020-04-19 ENCOUNTER — Ambulatory Visit (INDEPENDENT_AMBULATORY_CARE_PROVIDER_SITE_OTHER): Payer: 59 | Admitting: Primary Care

## 2020-04-19 ENCOUNTER — Other Ambulatory Visit: Payer: Self-pay

## 2020-04-19 ENCOUNTER — Encounter: Payer: Self-pay | Admitting: Primary Care

## 2020-04-19 VITALS — BP 112/70 | HR 67 | Temp 96.8°F | Ht 73.0 in | Wt 192.0 lb

## 2020-04-19 DIAGNOSIS — R5383 Other fatigue: Secondary | ICD-10-CM

## 2020-04-19 DIAGNOSIS — Z86010 Personal history of colonic polyps: Secondary | ICD-10-CM

## 2020-04-19 DIAGNOSIS — F329 Major depressive disorder, single episode, unspecified: Secondary | ICD-10-CM

## 2020-04-19 DIAGNOSIS — E785 Hyperlipidemia, unspecified: Secondary | ICD-10-CM

## 2020-04-19 DIAGNOSIS — R194 Change in bowel habit: Secondary | ICD-10-CM

## 2020-04-19 DIAGNOSIS — I2510 Atherosclerotic heart disease of native coronary artery without angina pectoris: Secondary | ICD-10-CM

## 2020-04-19 DIAGNOSIS — E039 Hypothyroidism, unspecified: Secondary | ICD-10-CM

## 2020-04-19 DIAGNOSIS — Z8042 Family history of malignant neoplasm of prostate: Secondary | ICD-10-CM

## 2020-04-19 DIAGNOSIS — I10 Essential (primary) hypertension: Secondary | ICD-10-CM

## 2020-04-19 DIAGNOSIS — J449 Chronic obstructive pulmonary disease, unspecified: Secondary | ICD-10-CM

## 2020-04-19 DIAGNOSIS — F419 Anxiety disorder, unspecified: Secondary | ICD-10-CM

## 2020-04-19 DIAGNOSIS — F32A Depression, unspecified: Secondary | ICD-10-CM

## 2020-04-19 DIAGNOSIS — E349 Endocrine disorder, unspecified: Secondary | ICD-10-CM

## 2020-04-19 LAB — LIPID PANEL
Cholesterol: 166 mg/dL (ref 0–200)
HDL: 42.2 mg/dL (ref >39.00)
LDL Cholesterol: 87 mg/dL (ref 0–99)
NonHDL: 123.82
Total CHOL/HDL Ratio: 4
Triglycerides: 182 mg/dL — ABNORMAL HIGH (ref 0.0–149.0)
VLDL: 36.4 mg/dL (ref 0.0–40.0)

## 2020-04-19 LAB — PSA: PSA: 0.3 ng/mL (ref 0.10–4.00)

## 2020-04-19 MED ORDER — ATORVASTATIN CALCIUM 20 MG PO TABS: ORAL_TABLET | ORAL | 3 refills | Status: DC

## 2020-04-19 NOTE — Assessment & Plan Note (Signed)
Recent labs unremarkable.

## 2020-04-19 NOTE — Patient Instructions (Addendum)
Increase fiber and water in your diet. Take a look below.  Ensure you are consuming 64 ounces of water daily.  Stop by the lab prior to leaving today. I will notify you of your results once received.   You will be contacted regarding your referral to pulmonology.  Please let us know if you have not been contacted within two weeks.   It was a pleasure to see you today!   High-Fiber Diet Fiber, also called dietary fiber, is a type of carbohydrate that is found in fruits, vegetables, whole grains, and beans. A high-fiber diet can have many health benefits. Your health care provider may recommend a high-fiber diet to help:  Prevent constipation. Fiber can make your bowel movements more regular.  Lower your cholesterol.  Relieve the following conditions: ? Swelling of veins in the anus (hemorrhoids). ? Swelling and irritation (inflammation) of specific areas of the digestive tract (uncomplicated diverticulosis). ? A problem of the large intestine (colon) that sometimes causes pain and diarrhea (irritable bowel syndrome, IBS).  Prevent overeating as part of a weight-loss plan.  Prevent heart disease, type 2 diabetes, and certain cancers. What is my plan? The recommended daily fiber intake in grams (g) includes:  38 g for men age 62 or younger.  30 g for men over age 4.  32 g for women age 40 or younger.  21 g for women over age 10. You can get the recommended daily intake of dietary fiber by:  Eating a variety of fruits, vegetables, grains, and beans.  Taking a fiber supplement, if it is not possible to get enough fiber through your diet. What do I need to know about a high-fiber diet?  It is better to get fiber through food sources rather than from fiber supplements. There is not a lot of research about how effective supplements are.  Always check the fiber content on the nutrition facts label of any prepackaged food. Look for foods that contain 5 g of fiber or more per  serving.  Talk with a diet and nutrition specialist (dietitian) if you have questions about specific foods that are recommended or not recommended for your medical condition, especially if those foods are not listed below.  Gradually increase how much fiber you consume. If you increase your intake of dietary fiber too quickly, you may have bloating, cramping, or gas.  Drink plenty of water. Water helps you to digest fiber. What are tips for following this plan?  Eat a wide variety of high-fiber foods.  Make sure that half of the grains that you eat each day are whole grains.  Eat breads and cereals that are made with whole-grain flour instead of refined flour or white flour.  Eat brown rice, bulgur wheat, or millet instead of white rice.  Start the day with a breakfast that is high in fiber, such as a cereal that contains 5 g of fiber or more per serving.  Use beans in place of meat in soups, salads, and pasta dishes.  Eat high-fiber snacks, such as berries, raw vegetables, nuts, and popcorn.  Choose whole fruits and vegetables instead of processed forms like juice or sauce. What foods can I eat?  Fruits Berries. Pears. Apples. Oranges. Avocado. Prunes and raisins. Dried figs. Vegetables Sweet potatoes. Spinach. Kale. Artichokes. Cabbage. Broccoli. Cauliflower. Green peas. Carrots. Squash. Grains Whole-grain breads. Multigrain cereal. Oats and oatmeal. Brown rice. Barley. Bulgur wheat. [place]. Quinoa. Bran muffins. Popcorn. Rye wafer crackers. Meats and other proteins [place], kidney,  and pinto beans. Soybeans. Split peas. Lentils. Nuts and seeds. Dairy Fiber-fortified yogurt. Beverages Fiber-fortified soy milk. Fiber-fortified orange juice. Other foods Fiber bars. The items listed above may not be a complete list of recommended foods and beverages. Contact a dietitian for more options. What foods are not recommended? Fruits Fruit juice. Cooked, strained  fruit. Vegetables Fried potatoes. Canned vegetables. Well-cooked vegetables. Grains White bread. Pasta made with refined flour. White rice. Meats and other proteins Fatty cuts of meat. Fried chicken or fried fish. Dairy Milk. Yogurt. Cream cheese. Sour cream. Fats and oils Butters. Beverages Soft drinks. Other foods Cakes and pastries. The items listed above may not be a complete list of foods and beverages to avoid. Contact a dietitian for more information. Summary  Fiber is a type of carbohydrate. It is found in fruits, vegetables, whole grains, and beans.  There are many health benefits of eating a high-fiber diet, such as preventing constipation, lowering blood cholesterol, helping with weight loss, and reducing your risk of heart disease, diabetes, and certain cancers.  Gradually increase your intake of fiber. Increasing too fast can result in cramping, bloating, and gas. Drink plenty of water while you increase your fiber.  The best sources of fiber include whole fruits and vegetables, whole grains, nuts, seeds, and beans. This information is not intended to replace advice given to you by your health care provider. Make sure you discuss any questions you have with your health care provider. Document Revised: 10/18/2017 Document Reviewed: 10/18/2017 Elsevier Patient Education  2020 [employer].

## 2020-04-19 NOTE — Assessment & Plan Note (Signed)
Repeat lipids pending.  Compliant to atorvastatin. 

## 2020-04-19 NOTE — Assessment & Plan Note (Signed)
Doing well on Lexapro and Wellbutrin, continue same.

## 2020-04-19 NOTE — Assessment & Plan Note (Signed)
No use of Advair in one year, asymptomatic. Continue to monitor.

## 2020-04-19 NOTE — Assessment & Plan Note (Signed)
Recent TSH unremarkable. Continue to monitor.

## 2020-04-19 NOTE — Assessment & Plan Note (Signed)
Full work up completed one month ago per Dr. Danise Mina, negative.  Suspect sleep apnea given HPI. Epworth sleepiness scale score of 11 today. Referral placed to pulmonology.

## 2020-04-19 NOTE — Assessment & Plan Note (Signed)
Colonoscopy UTD, recall in 2023.

## 2020-04-19 NOTE — Assessment & Plan Note (Signed)
Well controlled in the office today, continue lisinopril and carvedilol.

## 2020-04-19 NOTE — Progress Notes (Signed)
Subjective:    Patient ID: Kurt Heir., male    DOB: 16-Dec-1966, 54 y.o.   MRN: [id number]  HPI  This visit occurred during the SARS-CoV-2 public health emergency.  Safety protocols were in place, including screening questions prior to the visit, additional usage of staff PPE, and extensive cleaning of exam room while observing appropriate contact time as indicated for disinfecting solutions.   Mr. Kurt [name] is a 54 year old male with a history of arteriosclerosis of coronary artery, unstable angina, alcohol abuse, avascular necrosis of hips, myocardial infarction, anxiety and depression, hyperlipidemia who presents today for follow up and to discuss a few issues.  Overall doing well on current medications. He has not used his Advair in over one year. No recent use of albuterol inhaler.   He has noticed intermittent fatigue over the last two months. He will be so tired during the day that he'll nod off at work and sometimes have to pull over on the side of the road to nap while driving. He gets 8 hours of sleep nightly, wakes up several times during the night with "crazy dreams". He has been told that he snores, he lives alone.   Also with bowel habit changes, doesn't feel as though he's emptying his stools during bowel movements. Also with some anal leakage daily. This began about three months ago. He denies obvious bloody stools, constipation, diarrhea. Stools are overall softer in consistency and will have to use an excess amount of toilet paper to clean himself. He endorses a poor diet and drinks little water.  He was evaluated by Dr. Danise Mina a few weeks ago for same symptoms, full lab work up completed (TSH, testosterone, CBC, C-diff, etc) which was unremarkable.    BP Readings from Last 3 Encounters:  04/19/20 112/70  03/12/20 130/75  01/05/20 135/80     Review of Systems  Constitutional: Positive for fatigue.  Eyes: Negative for visual disturbance.  Respiratory:  Negative for shortness of breath.   Cardiovascular: Negative for chest pain.  Gastrointestinal: Negative for blood in stool, constipation, diarrhea and rectal pain.       Bowel habit changes  Neurological: Negative for dizziness and headaches.       Past Medical History:  Diagnosis Date  . Allergy    mild  . Anginal pain ([place])   . Anxiety   . Arthritis    maybe in hands   . Avascular necrosis of bone of left hip ([place]) 03/24/2016  . Avascular necrosis of bone of right hip ([place]) 01/14/2016  . COPD (chronic obstructive pulmonary disease) ([place])   . Coronary artery disease   . Coronary disease 09/12/2015  . Depression   . History of MI (myocardial infarction) 09/12/2015  . Hyperlipidemia   . Hypertension   . Myocardial infarction ([place])    20 yrs ago   . 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([place])   . Thyroid disease    reports that he has been low in the past but since he has stoped ETOH it has been normal     Social History   Socioeconomic History  . Marital status: Legally Separated    Spouse name: Not on file  . Number of children: Not on file  . Years of education: Not on file  . Highest education level: Not on file  Occupational History  . Not on file  Tobacco Use  . Smoking status: Former Smoker    Packs/day:  1.50    Years: 20.00    Pack years: 30.00    Types: Cigarettes    Quit date: 11/25/2015    Years since quitting: 4.4  . Smokeless tobacco: Never Used  Substance and Sexual Activity  . Alcohol use: No    Alcohol/week: 0.0 standard drinks    Comment: 4 months-recovering alcoholic per pt  . Drug use: No  . Sexual activity: Not on file  Other Topics Concern  . Not on file  Social History Narrative   Separated.   Works as a Optician, dispensing.   Enjoys fishing, Proofreader, golfing.   Social Determinants of Health   Financial Resource Strain:   . Difficulty of Paying Living Expenses:   Food Insecurity:   . Worried  About Charity fundraiser in the Last Year:   . Arboriculturist in the Last Year:   Transportation Needs:   . Film/video editor (Medical):   Marland Kitchen Lack of Transportation (Non-Medical):   Physical Activity:   . Days of Exercise per Week:   . Minutes of Exercise per Session:   Stress:   . Feeling of Stress :   Social Connections:   . Frequency of Communication with Friends and Family:   . Frequency of Social Gatherings with Friends and Family:   . Attends Religious Services:   . Active Member of Clubs or Organizations:   . Attends Archivist Meetings:   Marland Kitchen Marital Status:   Intimate Partner Violence:   . Fear of Current or Ex-Partner:   . Emotionally Abused:   Marland Kitchen Physically Abused:   . Sexually Abused:     Past Surgical History:  Procedure Laterality Date  . BIOPSY  12/05/2019   Procedure: BIOPSY;  Surgeon: Lavena Bullion, DO;  Location: [facility];  Service: Gastroenterology;;  . CARDIAC CATHETERIZATION     many years ago in 1990's  . COLONOSCOPY  03/02/2019  . COLONOSCOPY WITH PROPOFOL N/A 12/05/2019   Procedure: COLONOSCOPY WITH PROPOFOL;  Surgeon: Lavena Bullion, DO;  [facility];  Service: Gastroenterology;  Laterality: N/A;  . CORONARY STENT PLACEMENT    . TONSILLECTOMY     as a child  . TOTAL HIP ARTHROPLASTY Right 01/14/2016   Procedure: TOTAL HIP ARTHROPLASTY;  Surgeon: [name], MD;  Location: [facility];  Service: Orthopedics;  Laterality: Right;  . TOTAL HIP ARTHROPLASTY Left 03/24/2016   Procedure: TOTAL HIP ARTHROPLASTY;  Surgeon: [name], MD;  Location: [facility];  Service: Orthopedics;  Laterality: Left;    Family History  Problem Relation Age of Onset  . Cancer Mother   . Dementia Father   . Colon cancer Brother 72  . Cancer Maternal Grandmother   . Cancer Maternal Grandfather   . Cancer Paternal Grandmother   . Cancer Paternal Grandfather   . Colon polyps Neg Hx   . Esophageal cancer Neg Hx   . Rectal cancer Neg Hx   .  Stomach cancer Neg Hx     Allergies  Allergen Reactions  . Peanut-Containing Drug Products Anaphylaxis  . Erythromycin Other (See Comments)    Unknown childhood allergy    Current Outpatient Medications on File Prior to Visit  Medication Sig Dispense Refill  . aspirin EC 81 MG tablet Take 1 tablet (81 mg total) by mouth daily. 90 tablet 3  . buPROPion (WELLBUTRIN XL) 150 MG 24 hr tablet Take 1 tablet by mouth once daily for anxiety and depression. (Patient taking differently: Take 150  mg by mouth daily. ) 90 tablet 3  . carvedilol (COREG) 6.25 MG tablet Take 1 tablet (6.25 mg total) by mouth 2 (two) times daily with a meal. 180 tablet 3  . escitalopram (LEXAPRO) 10 MG tablet TAKE 1 TABLET BY MOUTH ONCE DAILY FOR ANXIETY AND FOR DEPRESSION 30 tablet 0  . lisinopril (PRINIVIL,ZESTRIL) 20 MG tablet Take 1 tablet by mouth once daily for blood pressure. (Patient taking differently: Take 20 mg by mouth daily. ) 90 tablet 3  . Multiple Vitamin (MULTIVITAMIN WITH MINERALS) TABS tablet Take 1 tablet by mouth daily.    . Omega-3 Fatty Acids (FISH OIL) 1000 MG CAPS Take 1,000 mg by mouth daily.     . VENTOLIN HFA 108 (90 Base) MCG/ACT inhaler INHALE 2 PUFFS BY MOUTH EVERY 4 HOURS AS NEEDED FOR WHEEZING OR COUGH (Patient taking differently: Inhale 2 puffs into the lungs every 4 (four) hours as needed for wheezing or shortness of breath. ) 18 each 0  . nitroGLYCERIN (NITROSTAT) 0.4 MG SL tablet Place 1 tablet (0.4 mg total) under the tongue every 5 (five) minutes as needed for chest pain. 100 tablet 0   No current facility-administered medications on file prior to visit.    BP 112/70   Pulse 67   Temp (!) 96.8 F (36 C) (Temporal)   Ht 6\' 1"  (1.854 m)   Wt 192 lb (87.1 kg)   SpO2 98%   BMI 25.33 kg/m    Objective:   Physical Exam  Constitutional: He appears well-nourished.  Cardiovascular: Normal rate and regular rhythm.  Respiratory: Effort normal and breath sounds normal.    Musculoskeletal:     Cervical back: Neck supple.  Skin: Skin is warm and dry.  Psychiatric: He has a normal mood and affect.           Assessment & Plan:

## 2020-04-19 NOTE — Assessment & Plan Note (Signed)
Present for the last three months. Colonoscopy UTD.  Suspect his diet may be playing a role, also very little water intake.  Handout provided to work on increased fiber intake, discussed supplements. Strongly advised he increase water intake.  Consider GI evaluation if no improvement.

## 2020-04-19 NOTE — Assessment & Plan Note (Signed)
Compliant to statin and aspirin. BP under excellent control.

## 2020-04-22 ENCOUNTER — Encounter: Payer: Self-pay | Admitting: *Deleted

## 2020-06-05 ENCOUNTER — Other Ambulatory Visit: Payer: Self-pay | Admitting: Primary Care

## 2020-06-05 DIAGNOSIS — F419 Anxiety disorder, unspecified: Secondary | ICD-10-CM

## 2020-06-05 DIAGNOSIS — F32A Depression, unspecified: Secondary | ICD-10-CM

## 2020-06-07 NOTE — Telephone Encounter (Signed)
Last prescribed on 03/13/2020 Last OV (follow up ) with Allie Bossier on 04/19/2020  No future OV scheduled

## 2020-06-07 NOTE — Telephone Encounter (Signed)
Refills sent to pharmacy. 

## 2020-07-18 ENCOUNTER — Other Ambulatory Visit: Payer: Self-pay | Admitting: Primary Care

## 2020-07-18 DIAGNOSIS — F32A Depression, unspecified: Secondary | ICD-10-CM

## 2020-07-18 DIAGNOSIS — F419 Anxiety disorder, unspecified: Secondary | ICD-10-CM

## 2020-09-08 ENCOUNTER — Other Ambulatory Visit: Payer: Self-pay | Admitting: Primary Care

## 2020-09-08 DIAGNOSIS — I10 Essential (primary) hypertension: Secondary | ICD-10-CM

## 2020-10-08 ENCOUNTER — Other Ambulatory Visit: Payer: Self-pay

## 2020-10-08 ENCOUNTER — Ambulatory Visit (INDEPENDENT_AMBULATORY_CARE_PROVIDER_SITE_OTHER): Payer: 59

## 2020-10-08 DIAGNOSIS — I426 Alcoholic cardiomyopathy: Secondary | ICD-10-CM | POA: Diagnosis not present

## 2020-10-08 LAB — ECHOCARDIOGRAM COMPLETE
Area-P 1/2: 3.36 cm2
S' Lateral: 3.5 cm

## 2020-10-09 ENCOUNTER — Telehealth: Payer: Self-pay

## 2020-10-09 NOTE — Telephone Encounter (Signed)
-----   Message from Loel Dubonnet, NP sent at 10/09/2020 10:50 AM EDT ----- Echocardiogram shows heart pumping function has now normalized.  Good result!  Mild thickening of heart muscle which is common in individuals with high blood pressure, continue optimal BP control.  No significant valvular abnormalities.  Please schedule his office visit in January with Dr. Saunders Revel or APP for annual follow-up.

## 2020-10-09 NOTE — Telephone Encounter (Signed)
Patient made aware of echo results with verbalized understanding. Patient sees Dr. Rockey Situ. Annual appt scheduled for 01/14/20 @ 4pm. Patient is aware of the appt date, time, and location.

## 2020-10-09 NOTE — Telephone Encounter (Signed)
Called to give the patient echo results. [email or web address]. 

## 2021-01-09 ENCOUNTER — Telehealth: Payer: Self-pay | Admitting: Cardiovascular Disease

## 2021-01-09 NOTE — Telephone Encounter (Signed)
     Patient Consent for Virtual Visit         Kurt [name]. has provided verbal consent on 01/09/2021 for a virtual visit (video or telephone).   CONSENT FOR VIRTUAL VISIT FOR:  Kurt [name].  By participating in this virtual visit I agree to the following:  I hereby voluntarily request, consent and authorize [facility] and its employed or contracted physicians, physician assistants, nurse practitioners or other licensed health care professionals (the Practitioner), to provide me with telemedicine health care services (the "Services") as deemed necessary by the treating Practitioner. I acknowledge and consent to receive the Services by the Practitioner via telemedicine. I understand that the telemedicine visit will involve communicating with the Practitioner through live audiovisual communication technology and the disclosure of certain medical information by electronic transmission. I acknowledge that I have been given the opportunity to request an in-person assessment or other available alternative prior to the telemedicine visit and am voluntarily participating in the telemedicine visit.  I understand that I have the right to withhold or withdraw my consent to the use of telemedicine in the course of my care at any time, without affecting my right to future care or treatment, and that the Practitioner or I may terminate the telemedicine visit at any time. I understand that I have the right to inspect all information obtained and/or recorded in the course of the telemedicine visit and may receive copies of available information for a reasonable fee.  I understand that some of the potential risks of receiving the Services via telemedicine include:  Marland Kitchen Delay or interruption in medical evaluation due to technological equipment failure or disruption; . Information transmitted may not be sufficient (e.g. poor resolution of images) to allow for appropriate medical decision making by the  Practitioner; and/or  . In rare instances, security protocols could fail, causing a breach of personal health information.  Furthermore, I acknowledge that it is my responsibility to provide information about my medical history, conditions and care that is complete and accurate to the best of my ability. I acknowledge that Practitioner's advice, recommendations, and/or decision may be based on factors not within their control, such as incomplete or inaccurate data provided by me or distortions of diagnostic images or specimens that may result from electronic transmissions. I understand that the practice of medicine is not an exact science and that Practitioner makes no warranties or guarantees regarding treatment outcomes. I acknowledge that a copy of this consent can be made available to me via my patient portal ([place]), or I can request a printed copy by calling the office of [facility].    I understand that my insurance will be billed for this visit.   I have read or had this consent read to me. . I understand the contents of this consent, which adequately explains the benefits and risks of the Services being provided via telemedicine.  . I have been provided ample opportunity to ask questions regarding this consent and the Services and have had my questions answered to my satisfaction. . I give my informed consent for the services to be provided through the use of telemedicine in my medical care

## 2021-01-10 ENCOUNTER — Other Ambulatory Visit: Payer: Self-pay | Admitting: Primary Care

## 2021-01-10 DIAGNOSIS — I10 Essential (primary) hypertension: Secondary | ICD-10-CM

## 2021-01-12 NOTE — Progress Notes (Signed)
Virtual Visit via Video Note   This visit type was conducted due to national recommendations for restrictions regarding the COVID-19 Pandemic (e.g. social distancing) in an effort to limit this patient's exposure and mitigate transmission in our community.  Due to his co-morbid illnesses, this patient is at least at moderate risk for complications without adequate follow up.  This format is felt to be most appropriate for this patient at this time.  All issues noted in this document were discussed and addressed.  A limited physical exam was performed with this format.  Please refer to the patient's chart for his consent to telehealth for [facility].   I connected with  Kurt [name]. on 01/13/21 by a video enabled telemedicine application and verified that I am speaking with the correct person using two identifiers. I am contacting the patient above from our cardiology clinic office or alternate office work station to their home, I discussed the limitations of evaluation and management by telemedicine. The patient expressed understanding and agreed to proceed.   Evaluation Performed:  Follow-up visit  Date:  01/13/2021   ID:  Kurt [name]., DOB 1966-10-04, MRN [id number]  Patient Location:  [address] [place] [address]   Provider location:   [facility] Dad, [place] office  PCP:  Kurt Nest, NP  Cardiologist:  Hubbard Robinson [facility]   Chief Complaint  Patient presents with  . 12 month follow up    "doing well."  Medications reviewed by the patient verbally.     History of Present Illness:    Kurt [name]. is a 55 y.o. male who presents via audio/video conferencing for a telehealth visit today.   The patient does not symptoms concerning for COVID-19 infection (fever, chills, cough, or new SHORTNESS OF BREATH).   Patient has a past medical history of 55 year old male with a long history of  Coronary artery disease,  2  stents likely to his LAD mid to distal region in 2006 Alcoholism, Elevated LFTs smoker presenting to the hospital with chest discomfort, shortness of breath, anxiety,  stress test showing no significant ischemia, possible small fixed perfusion defect in the apical region,  low ejection fraction 27%, confirmed with echocardiogram ejection fraction 35-40% in 10/2015 EF up to 50 to 55% in 09/2020 who presents for follow-up of his dilated cardiomyopathy (presumed secondary to ETOH at the time)  Last seen by our clinic 01/05/2020 At that time reports he had stopped drinking and smoking was active  Repeat echo reviewed: 10/21 1. Left ventricular ejection fraction, by estimation, is 50 to 55%. The  left ventricle has low normal function. The left ventricle has no regional  wall motion abnormalities. There is mild left ventricular hypertrophy.  Left ventricular diastolic  parameters were normal.    quit smoking, drinking Overall feels well  Labs reviewed on todays visiyt LDL consistently above goal  Active at work, no regular exercise  Other past medical history reviewed Ejection fraction by echocardiogram 2009 was 55-60% per the notes Drop in EF in 2016   Past Medical History:  Diagnosis Date  . Allergy    mild  . Anginal pain ([facility])   . Anxiety   . Arthritis    maybe in hands   . Avascular necrosis of bone of left hip ([facility]) 03/24/2016  . Avascular necrosis of bone of right hip ([facility]) 01/14/2016  . COPD (chronic obstructive pulmonary disease) ([facility])   . Coronary  artery disease   . Coronary disease 09/12/2015  . Depression   . History of MI (myocardial infarction) 09/12/2015  . Hyperlipidemia   . Hypertension   . Myocardial infarction ([facility])    20 yrs ago   . 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[facility])   . Postoperative anemia due to acute blood loss 03/27/2016  . 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([facility])   . Thyroid disease    reports that he has been low in the past but since he has stoped  ETOH it has been normal   Past Surgical History:  Procedure Laterality Date  . BIOPSY  12/05/2019   Procedure: BIOPSY;  Surgeon: Shellia Cleverly, DO;  [facility];  Service: Gastroenterology;;  . CARDIAC CATHETERIZATION     many years ago in 1990's  . COLONOSCOPY  03/02/2019  . COLONOSCOPY WITH PROPOFOL N/A 12/05/2019   Procedure: COLONOSCOPY WITH PROPOFOL;  Surgeon: Shellia Cleverly, DO;  [facility];  Service: Gastroenterology;  Laterality: N/A;  . CORONARY STENT PLACEMENT    . TONSILLECTOMY     as a child  . TOTAL HIP ARTHROPLASTY Right 01/14/2016   Procedure: TOTAL HIP ARTHROPLASTY;  Surgeon: Teryl Lucy, MD;  [facility];  Service: Orthopedics;  Laterality: Right;  . TOTAL HIP ARTHROPLASTY Left 03/24/2016   Procedure: TOTAL HIP ARTHROPLASTY;  Surgeon: Teryl Lucy, MD;  Location[facility];  Service: Orthopedics;  Laterality: Left;      Allergies:   Peanut-containing drug products and Erythromycin   Social History   Tobacco Use  . Smoking status: Former Smoker    Packs/day: 1.50    Years: 20.00    Pack years: 30.00    Types: Cigarettes    Quit date: 11/25/2015    Years since quitting: 5.1  . Smokeless tobacco: Never Used  Substance Use Topics  . Alcohol use: No    Alcohol/week: 0.0 standard drinks    Comment: 4 months-recovering alcoholic per pt  . Drug use: No     Current Outpatient Medications on File Prior to Visit  Medication Sig Dispense Refill  . aspirin EC 81 MG tablet Take 1 tablet (81 mg total) by mouth daily. 90 tablet 3  . atorvastatin (LIPITOR) 20 MG tablet TAKE 1 TABLET BY MOUTH ONCE DAILY IN THE EVENING for cholesterol. 90 tablet 3  . buPROPion (WELLBUTRIN XL) 150 MG 24 hr tablet TAKE 1 TABLET BY MOUTH ONCE DAILY FOR ANXIETY AND FOR DEPRESSION 90 tablet 1  . carvedilol (COREG) 6.25 MG tablet Take 1 tablet (6.25 mg total) by mouth 2 (two) times daily with a meal. 180 tablet 3  . escitalopram (LEXAPRO) 10 MG tablet TAKE 1  TABLET BY MOUTH ONCE DAILY FOR ANXIETY AND FOR DEPRESSION 90 tablet 2  . lisinopril (ZESTRIL) 20 MG tablet Take 1 tablet by mouth once daily for blood pressure 90 tablet 0  . Multiple Vitamin (MULTIVITAMIN WITH MINERALS) TABS tablet Take 1 tablet by mouth daily.    . nitroGLYCERIN (NITROSTAT) 0.4 MG SL tablet Place 1 tablet (0.4 mg total) under the tongue every 5 (five) minutes as needed for chest pain. 100 tablet 0  . Omega-3 Fatty Acids (FISH OIL) 1000 MG CAPS Take 1,000 mg by mouth daily.     . VENTOLIN HFA 108 (90 Base) MCG/ACT inhaler INHALE 2 PUFFS BY MOUTH EVERY 4 HOURS AS NEEDED FOR WHEEZING OR COUGH (Patient taking differently: Inhale 2 puffs into the lungs every 4 (four) hours as needed for wheezing or shortness of breath.) 18  each 0   No current facility-administered medications on file prior to visit.     Family Hx: The patient's family history includes Cancer in his maternal grandfather, maternal grandmother, mother, paternal grandfather, and paternal grandmother; Colon cancer (age of onset: 33) in his brother; Dementia in his father. There is no history of Colon polyps, Esophageal cancer, Rectal cancer, or Stomach cancer.  ROS:   Please see the history of present illness.    Review of Systems  Constitutional: Negative.   HENT: Negative.   Respiratory: Negative.   Cardiovascular: Negative.   Gastrointestinal: Negative.   Musculoskeletal: Negative.   Neurological: Negative.   Psychiatric/Behavioral: Negative.   All other systems reviewed and are negative.    Labs/Other Tests and Data Reviewed:    Recent Labs: 03/13/2020: ALT 17; BUN 19; Creatinine, Ser 0.97; Hemoglobin 13.7; Platelets 246.0; Potassium 4.7; Sodium 137; TSH 2.00   Recent Lipid Panel Lab Results  Component Value Date/Time   CHOL 166 04/19/2020 08:30 AM   TRIG 182.0 (H) 04/19/2020 08:30 AM   HDL 42.20 04/19/2020 08:30 AM   CHOLHDL 4 04/19/2020 08:30 AM   LDLCALC 87 04/19/2020 08:30 AM    Wt Readings  from Last 3 Encounters:  01/13/21 189 lb (85.7 kg)  04/19/20 192 lb (87.1 kg)  03/12/20 185 lb (83.9 kg)     Exam:    Vital Signs: Vital signs may also be detailed in the HPI BP 128/69   Pulse 71   Ht 6' (1.829 m)   Wt 189 lb (85.7 kg)   BMI 25.63 kg/m   Wt Readings from Last 3 Encounters:  01/13/21 189 lb (85.7 kg)  04/19/20 192 lb (87.1 kg)  03/12/20 185 lb (83.9 kg)   Temp Readings from Last 3 Encounters:  04/19/20 (!) 96.8 F (36 C) (Temporal)  12/05/19 98.1 F (36.7 C) (Oral)  03/02/19 (!) 96.4 F (35.8 C)   BP Readings from Last 3 Encounters:  01/13/21 128/69  04/19/20 112/70  03/12/20 130/75   Pulse Readings from Last 3 Encounters:  01/13/21 71  04/19/20 67  03/12/20 70     Well nourished, well developed male in no acute distress. Constitutional:  oriented to person, place, and time. No distress.  Head: Normocephalic and atraumatic.  Eyes:  no discharge. No scleral icterus.  Neck: Normal range of motion. Neck supple.  Pulmonary/Chest: No audible wheezing, no distress, appears comfortable Musculoskeletal: Normal range of motion.  no  tenderness or deformity.  Neurological:   Coordination normal. Full exam not performed Skin:  No rash Psychiatric:  normal mood and affect. behavior is normal. Thought content normal.    ASSESSMENT & PLAN:    Problem List Items Addressed This Visit    History of MI (myocardial infarction) (Chronic)    Other Visit Diagnoses    Coronary artery disease of native artery of native heart with stable angina pectoris (HCC)    -  Primary   Relevant Medications   ezetimibe (ZETIA) 10 MG tablet   Dilated cardiomyopathy (HCC)       Relevant Medications   ezetimibe (ZETIA) 10 MG tablet   Essential hypertension       Relevant Medications   ezetimibe (ZETIA) 10 MG tablet      dilated cardiomyopathy (HCC) Continue carvedilol lisinopril EF normalized on echo10/2021 Off ETOH Stopped smoking  Arteriosclerosis of coronary  artery - Plan: EKG 12-Lead Stopped smoking Currently with no symptoms of angina. No further workup at this time. Continue current medication regimen.  Add zetia as below  Benign essential HTN - Plan: EKG 12-Lead Blood pressure is well controlled on today's visit. No changes made to the medications.  Mixed hyperlipidemia - Plan: EKG 12-Lead Continue lipitor 20 Prior myalgias, better now Will avoid higher statin Add zetia to achieve LDL <70  Alcoholism (HCC) Stressed importance of alcohol cessation and effect on the heart Stopped 2 years ago or so, EF normalized   COVID-19 Education: The signs and symptoms of COVID-19 were discussed with the patient and how to seek care for testing (follow up with PCP or arrange E-visit).  The importance of social distancing was discussed today.  Patient Risk:   After full review of this patients clinical status, I feel that they are at least moderate risk at this time.  Time:   Today, I have spent 25 minutes with the patient with telehealth technology discussing the cardiac and medical problems/diagnoses detailed above   Additional 10 min spent reviewing the chart prior to patient visit today   Medication Adjustments/Labs and Tests Ordered: Current medicines are reviewed at length with the patient today.  Concerns regarding medicines are outlined above.   Tests Ordered: No tests ordered   Medication Changes: No changes made     Signed, Julien Nordmann, MD  [facility] [address], [place], [place] [address]

## 2021-01-13 ENCOUNTER — Telehealth (INDEPENDENT_AMBULATORY_CARE_PROVIDER_SITE_OTHER): Payer: 59 | Admitting: Cardiovascular Disease

## 2021-01-13 ENCOUNTER — Encounter: Payer: Self-pay | Admitting: Cardiovascular Disease

## 2021-01-13 ENCOUNTER — Other Ambulatory Visit: Payer: Self-pay

## 2021-01-13 VITALS — BP 128/69 | HR 71 | Ht 72.0 in | Wt 189.0 lb

## 2021-01-13 DIAGNOSIS — I420 Dilated cardiomyopathy: Secondary | ICD-10-CM | POA: Diagnosis not present

## 2021-01-13 DIAGNOSIS — E785 Hyperlipidemia, unspecified: Secondary | ICD-10-CM

## 2021-01-13 DIAGNOSIS — I10 Essential (primary) hypertension: Secondary | ICD-10-CM

## 2021-01-13 DIAGNOSIS — I252 Old myocardial infarction: Secondary | ICD-10-CM | POA: Diagnosis not present

## 2021-01-13 DIAGNOSIS — I426 Alcoholic cardiomyopathy: Secondary | ICD-10-CM

## 2021-01-13 DIAGNOSIS — I25118 Atherosclerotic heart disease of native coronary artery with other forms of angina pectoris: Secondary | ICD-10-CM | POA: Diagnosis not present

## 2021-01-13 MED ORDER — LISINOPRIL 20 MG PO TABS: ORAL_TABLET | ORAL | 3 refills | Status: DC

## 2021-01-13 MED ORDER — EZETIMIBE 10 MG PO TABS: 10.0000 mg | ORAL_TABLET | Freq: Every day | ORAL | 3 refills | Status: DC

## 2021-01-13 MED ORDER — ATORVASTATIN CALCIUM 20 MG PO TABS: ORAL_TABLET | ORAL | 3 refills | Status: DC

## 2021-01-13 MED ORDER — NITROGLYCERIN 0.4 MG SL SUBL: 0.4000 mg | SUBLINGUAL_TABLET | SUBLINGUAL | 1 refills | Status: AC | PRN

## 2021-01-13 MED ORDER — CARVEDILOL 6.25 MG PO TABS: 6.2500 mg | ORAL_TABLET | Freq: Two times a day (BID) | ORAL | 3 refills | Status: DC

## 2021-01-13 NOTE — Patient Instructions (Addendum)
Medication Instructions:  Zetia 10 mg by mouth once a day (script has been sent in to pharmacy)  All your cardiac medications were sent in for a 1 year refill for 90 day supply, call pharmacy to have refill when needed.   Lab work: No new labs needed  Testing/Procedures: No new testing needed   Follow-Up: At [facility], you and your health needs are our priority.  As part of our continuing mission to provide you with exceptional heart care, we have created designated Provider Care Teams.  These Care Teams include your primary Cardiologist (physician) and Advanced Practice Providers (APPs -  Physician Assistants and Nurse Practitioners) who all work together to provide you with the care you need, when you need it.  . You will need a follow up appointment in 12 months  . Providers on your designated Care Team:   . Murray Hodgkins, NP . Christell Faith, PA-C . Marrianne Mood, PA-C  Any Other Special Instructions Will Be Listed Below (If Applicable).  COVID-19 Vaccine Information can be found at: ShippingScam.co.uk For questions related to vaccine distribution or appointments, please email [email or web address] or call [phone].

## 2021-04-03 ENCOUNTER — Other Ambulatory Visit: Payer: Self-pay | Admitting: Primary Care

## 2021-04-03 DIAGNOSIS — F419 Anxiety disorder, unspecified: Secondary | ICD-10-CM

## 2021-04-03 DIAGNOSIS — F32A Depression, unspecified: Secondary | ICD-10-CM

## 2021-06-07 ENCOUNTER — Other Ambulatory Visit: Payer: Self-pay | Admitting: Primary Care

## 2021-06-07 DIAGNOSIS — F419 Anxiety disorder, unspecified: Secondary | ICD-10-CM

## 2021-06-07 DIAGNOSIS — F32A Depression, unspecified: Secondary | ICD-10-CM

## 2021-06-08 NOTE — Telephone Encounter (Signed)
I have not seen patient since April 2021, he MUST be seen ASAP for CPE/follow up for further refills. Add anywhere.

## 2021-06-11 NOTE — Telephone Encounter (Signed)
Appointment has been made. Informed must be seen for more refills. No further action needed at this time.

## 2021-06-25 ENCOUNTER — Encounter: Payer: Self-pay | Admitting: Primary Care

## 2021-06-25 ENCOUNTER — Other Ambulatory Visit: Payer: Self-pay

## 2021-06-25 ENCOUNTER — Ambulatory Visit (INDEPENDENT_AMBULATORY_CARE_PROVIDER_SITE_OTHER): Payer: 59 | Admitting: Primary Care

## 2021-06-25 VITALS — BP 140/80 | HR 73 | Temp 97.9°F | Ht 72.0 in | Wt 180.0 lb

## 2021-06-25 DIAGNOSIS — Z0000 Encounter for general adult medical examination without abnormal findings: Secondary | ICD-10-CM | POA: Diagnosis not present

## 2021-06-25 DIAGNOSIS — Z86010 Personal history of colonic polyps: Secondary | ICD-10-CM

## 2021-06-25 DIAGNOSIS — E039 Hypothyroidism, unspecified: Secondary | ICD-10-CM | POA: Diagnosis not present

## 2021-06-25 DIAGNOSIS — Z23 Encounter for immunization: Secondary | ICD-10-CM | POA: Diagnosis not present

## 2021-06-25 DIAGNOSIS — I10 Essential (primary) hypertension: Secondary | ICD-10-CM

## 2021-06-25 DIAGNOSIS — J449 Chronic obstructive pulmonary disease, unspecified: Secondary | ICD-10-CM

## 2021-06-25 DIAGNOSIS — Z8042 Family history of malignant neoplasm of prostate: Secondary | ICD-10-CM

## 2021-06-25 DIAGNOSIS — Z122 Encounter for screening for malignant neoplasm of respiratory organs: Secondary | ICD-10-CM

## 2021-06-25 DIAGNOSIS — E785 Hyperlipidemia, unspecified: Secondary | ICD-10-CM

## 2021-06-25 DIAGNOSIS — I2510 Atherosclerotic heart disease of native coronary artery without angina pectoris: Secondary | ICD-10-CM

## 2021-06-25 DIAGNOSIS — Z125 Encounter for screening for malignant neoplasm of prostate: Secondary | ICD-10-CM

## 2021-06-25 DIAGNOSIS — F32A Depression, unspecified: Secondary | ICD-10-CM

## 2021-06-25 DIAGNOSIS — F419 Anxiety disorder, unspecified: Secondary | ICD-10-CM

## 2021-06-25 DIAGNOSIS — K635 Polyp of colon: Secondary | ICD-10-CM

## 2021-06-25 MED ORDER — FLUTICASONE-SALMETEROL 250-50 MCG/ACT IN AEPB: 1.0000 | INHALATION_SPRAY | Freq: Two times a day (BID) | RESPIRATORY_TRACT | 2 refills | Status: DC

## 2021-06-25 NOTE — Assessment & Plan Note (Signed)
Due for repeat colonoscopy in December 2023.

## 2021-06-25 NOTE — Assessment & Plan Note (Signed)
Has not taken lisinopril 20 mg or carvedilol 6.25 mg today. BP above goal, but prior BP readings under good control.  Continue lisinopril 20 mg and carvedilol 6.25 mg BID. CMP pending.

## 2021-06-25 NOTE — Assessment & Plan Note (Signed)
Continue atorvastatin 20 mg and aspirin 81 mg. Repeat lipid panel pending.

## 2021-06-25 NOTE — Assessment & Plan Note (Addendum)
Recent use of Advair daily due to Summer heat, refill provided. Infrequent use of Ventolin inhaler. Continue current regimen.   Referral placed for lung cancer screening program.

## 2021-06-25 NOTE — Assessment & Plan Note (Signed)
Overall feels stable, is going through some marital stress. He is working to find a good Electronics engineer.   Continue Lexapro 10 mg and Wellbutrin XL 150 mg as he finds these to be beneficial.

## 2021-06-25 NOTE — Patient Instructions (Signed)
Stop by the lab prior to leaving today. I will notify you of your results once received.   You will be contacted regarding your referral to lung cancer screening.  Please let us know if you have not been contacted within two weeks.   Schedule a nurse visit to complete your second shingles vaccine 2-6 months from now.  It was a pleasure to see you today!  Preventive Care 63-57 Years Old, Male Preventive care refers to lifestyle choices and visits with your health care provider that can promote health and wellness. This includes: A yearly physical exam. This is also called an annual wellness visit. Regular dental and eye exams. Immunizations. Screening for certain conditions. Healthy lifestyle choices, such as: Eating a healthy diet. Getting regular exercise. Not using drugs or products that contain nicotine and tobacco. Limiting alcohol use. What can I expect for my preventive care visit? Physical exam Your health care provider will check your: Height and weight. These may be used to calculate your BMI (body mass index). BMI is a measurement that tells if you are at a healthy weight. Heart rate and blood pressure. Body temperature. Skin for abnormal spots. Counseling Your health care provider may ask you questions about your: Past medical problems. Family's medical history. Alcohol, tobacco, and drug use. Emotional well-being. Home life and relationship well-being. Sexual activity. Diet, exercise, and sleep habits. Work and work Statistician. Access to firearms. What immunizations do I need?  Vaccines are usually given at various ages, according to a schedule. Your health care provider will recommend vaccines for you based on your age, medicalhistory, and lifestyle or other factors, such as travel or where you work. What tests do I need? Blood tests Lipid and cholesterol levels. These may be checked every 5 years, or more often if you are over 25 years old. Hepatitis C  test. Hepatitis B test. Screening Lung cancer screening. You may have this screening every year starting at age 29 if you have a 30-pack-year history of smoking and currently smoke or have quit within the past 15 years. Prostate cancer screening. Recommendations will vary depending on your family history and other risks. Genital exam to check for testicular cancer or hernias. Colorectal cancer screening. All adults should have this screening starting at age 59 and continuing until age 88. Your health care provider may recommend screening at age 7 if you are at increased risk. You will have tests every 1-10 years, depending on your results and the type of screening test. Diabetes screening. This is done by checking your blood sugar (glucose) after you have not eaten for a while (fasting). You may have this done every 1-3 years. STD (sexually transmitted disease) testing, if you are at risk. Follow these instructions at home: Eating and drinking  Eat a diet that includes fresh fruits and vegetables, whole grains, lean protein, and low-fat dairy products. Take vitamin and mineral supplements as recommended by your health care provider. Do not drink alcohol if your health care provider tells you not to drink. If you drink alcohol: Limit how much you have to 0-2 drinks a day. Be aware of how much alcohol is in your drink. In the [place] one drink equals one 12 oz bottle of beer (355 mL), one 5 oz glass of wine (148 mL), or one 1 oz glass of hard liquor (44 mL).  Lifestyle Take daily care of your teeth and gums. Brush your teeth every morning and night with fluoride toothpaste. Floss one time each day.  Stay active. Exercise for at least 30 minutes 5 or more days each week. Do not use any products that contain nicotine or tobacco, such as cigarettes, e-cigarettes, and chewing tobacco. If you need help quitting, ask your health care provider. Do not use drugs. If you are sexually active,  practice safe sex. Use a condom or other form of protection to prevent STIs (sexually transmitted infections). If told by your health care provider, take low-dose aspirin daily starting at age 72. Find healthy ways to cope with stress, such as: Meditation, yoga, or listening to music. Journaling. Talking to a trusted person. Spending time with friends and family. Safety Always wear your seat belt while driving or riding in a vehicle. Do not drive: If you have been drinking alcohol. Do not ride with someone who has been drinking. When you are tired or distracted. While texting. Wear a helmet and other protective equipment during sports activities. If you have firearms in your house, make sure you follow all gun safety procedures. What's next? Go to your health care provider once a year for an annual wellness visit. Ask your health care provider how often you should have your eyes and teeth checked. Stay up to date on all vaccines. This information is not intended to replace advice given to you by your health care provider. Make sure you discuss any questions you have with your healthcare provider. Document Revised: 09/12/2019 Document Reviewed: 12/08/2018 Elsevier Patient Education  2022 [employer].

## 2021-06-25 NOTE — Assessment & Plan Note (Signed)
Compliant to atorvastatin 20 mg, continue same. Repeat lipid panel pending.

## 2021-06-25 NOTE — Assessment & Plan Note (Signed)
TSH has been normal for years. Repeat testing pending today. Continue without medication

## 2021-06-25 NOTE — Progress Notes (Signed)
Subjective:    Patient ID: Kurt Heir., male    DOB: 13-Jul-1966, 55 y.o.   MRN: [id number]  HPI  Kurt Ploeger. is a very pleasant 55 y.o. male who presents today for complete physical.  Immunizations: -Tetanus: 2011 -Influenza: Did not complete last season  -Covid-19: 2 vaccines -Shingles: Never completed  -Pneumonia: 2009  Diet: [place].  Exercise: No regular exercise, is active   Eye exam: Completes ever 2-3 years  Dental exam: Completes annually   Colonoscopy: Completed in 2020, due in December 2023 PSA: Due  Lung Cancer Screening: CT chest completed in 2017, nodules.    BP Readings from Last 3 Encounters:  06/25/21 140/80  01/13/21 128/69  04/19/20 112/70      Review of Systems  Constitutional:  Negative for unexpected weight change.  HENT:  Negative for rhinorrhea.   Respiratory:  Negative for cough and shortness of breath.   Cardiovascular:  Negative for chest pain.  Gastrointestinal:  Negative for constipation and diarrhea.  Genitourinary:  Negative for difficulty urinating.  Musculoskeletal:  Positive for arthralgias. Negative for myalgias.  Skin:  Negative for rash.  Allergic/Immunologic: Negative for environmental allergies.  Neurological:  Negative for dizziness, numbness and headaches.        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[facility])    Postoperative anemia due to acute blood loss 03/27/2016   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[facility])    Thyroid  disease    reports that he has been low in the past but since he has stoped ETOH it has been normal    Social History   Socioeconomic History   Marital status: Legally Separated    Spouse name: Not on file   Number of children: Not on file   Years of education: Not on file   Highest education level: Not on file  Occupational History   Not on file  Tobacco Use   Smoking status: Former    Packs/day: 1.50    Years: 20.00    Pack years: 30.00    Types: Cigarettes    Quit date: 11/25/2015    Years since quitting: 5.5   Smokeless tobacco: Never  Substance and Sexual Activity   Alcohol use: No    Alcohol/week: 0.0 standard drinks    Comment: 4 months-recovering alcoholic per pt   Drug use: No   Sexual activity: Not on file  Other Topics Concern   Not on file  Social History Narrative   Separated.   Works as a Optician, dispensing.   Enjoys fishing, Proofreader, golfing.   Social Determinants of Health   Financial Resource Strain: Not on file  Food Insecurity: Not on file  Transportation Needs: Not on file  Physical Activity: Not on file  Stress: Not on file  Social Connections: Not on file  Intimate Partner Violence: Not on file  Past Surgical History:  Procedure Laterality Date   BIOPSY  12/05/2019   Procedure: BIOPSY;  Surgeon: Lavena Bullion, DO;  Location: [facility];  Service: Gastroenterology;;   CARDIAC CATHETERIZATION     many years ago in 1990's   COLONOSCOPY  03/02/2019   COLONOSCOPY WITH PROPOFOL N/A 12/05/2019   Procedure: COLONOSCOPY WITH PROPOFOL;  Surgeon: Lavena Bullion, DO;  Location: [facility];  Service: Gastroenterology;  Laterality: N/A;   CORONARY STENT PLACEMENT     TONSILLECTOMY     as a child   TOTAL HIP ARTHROPLASTY Right 01/14/2016   Procedure: TOTAL HIP ARTHROPLASTY;  Surgeon: [name], MD;  Location: [facility];  Service: Orthopedics;  Laterality: Right;   TOTAL HIP ARTHROPLASTY Left 03/24/2016   Procedure: TOTAL HIP  ARTHROPLASTY;  Surgeon: [name], MD;  Location: [facility];  Service: Orthopedics;  Laterality: Left;    Family History  Problem Relation Age of Onset   Cancer Mother    Dementia Father    Colon cancer Brother 34   Cancer Maternal Grandmother    Cancer Maternal Grandfather    Cancer Paternal Grandmother    Cancer Paternal Grandfather    Colon polyps Neg Hx    Esophageal cancer Neg Hx    Rectal cancer Neg Hx    Stomach cancer Neg Hx     Allergies  Allergen Reactions   Peanut-Containing Drug Products Anaphylaxis   Erythromycin Other (See Comments)    Unknown childhood allergy    Current Outpatient Medications on File Prior to Visit  Medication Sig Dispense Refill   aspirin EC 81 MG tablet Take 1 tablet (81 mg total) by mouth daily. 90 tablet 3   atorvastatin (LIPITOR) 20 MG tablet TAKE 1 TABLET BY MOUTH ONCE DAILY IN THE EVENING for cholesterol. 90 tablet 3   buPROPion (WELLBUTRIN XL) 150 MG 24 hr tablet TAKE 1 TABLET BY MOUTH ONCE DAILY FOR ANXIETY AND FOR DEPRESSION . APPOINTMENT REQUIRED FOR FUTURE REFILLS 30 tablet 0   carvedilol (COREG) 6.25 MG tablet Take 1 tablet (6.25 mg total) by mouth 2 (two) times daily with a meal. 180 tablet 3   escitalopram (LEXAPRO) 10 MG tablet TAKE 1 TABLET BY MOUTH ONCE DAILY FOR ANXIETY AND FOR DEPRESSION 90 tablet 2   ezetimibe (ZETIA) 10 MG tablet Take 1 tablet (10 mg total) by mouth daily. 90 tablet 3   lisinopril (ZESTRIL) 20 MG tablet Take one tab by mouth one time a day for blood pressure 90 tablet 3   Multiple Vitamin (MULTIVITAMIN WITH MINERALS) TABS tablet Take 1 tablet by mouth daily.     nitroGLYCERIN (NITROSTAT) 0.4 MG SL tablet Place 1 tablet (0.4 mg total) under the tongue every 5 (five) minutes as needed for chest pain. 100 tablet 1   Omega-3 Fatty Acids (FISH OIL) 1000 MG CAPS Take 1,000 mg by mouth daily.      VENTOLIN HFA 108 (90 Base) MCG/ACT inhaler INHALE 2 PUFFS BY MOUTH EVERY 4 HOURS AS NEEDED FOR WHEEZING OR COUGH  (Patient taking differently: Inhale 2 puffs into the lungs every 4 (four) hours as needed for wheezing or shortness of breath.) 18 each 0   No current facility-administered medications on file prior to visit.    BP 140/80   Pulse 73   Temp 97.9 F (36.6 C) (Temporal)   Ht 6' (1.829 m)   Wt 180 lb (81.6 kg)   SpO2 97%   BMI 24.41 kg/m  Objective:   Physical Exam HENT:  Right Ear: Tympanic membrane and ear canal normal.     Left Ear: Tympanic membrane and ear canal normal.     Nose: Nose normal.     Right Sinus: No maxillary sinus tenderness or frontal sinus tenderness.     Left Sinus: No maxillary sinus tenderness or frontal sinus tenderness.  Eyes:     Conjunctiva/sclera: Conjunctivae normal.  Neck:     Thyroid: No thyromegaly.     Vascular: No carotid bruit.  Cardiovascular:     Rate and Rhythm: Normal rate and regular rhythm.     Heart sounds: Normal heart sounds.  Pulmonary:     Effort: Pulmonary effort is normal.     Breath sounds: Normal breath sounds. No wheezing or rales.  Abdominal:     General: Bowel sounds are normal.     Palpations: Abdomen is soft.     Tenderness: There is no abdominal tenderness.  Musculoskeletal:        General: Normal range of motion.     Cervical back: Neck supple.  Skin:    General: Skin is warm and dry.  Neurological:     Mental Status: He is alert and oriented to person, place, and time.     Cranial Nerves: No cranial nerve deficit.     Deep Tendon Reflexes: Reflexes are normal and symmetric.  Psychiatric:        Mood and Affect: Mood normal.          Assessment & Plan:      This visit occurred during the SARS-CoV-2 public health emergency.  Safety protocols were in place, including screening questions prior to the visit, additional usage of staff PPE, and extensive cleaning of exam room while observing appropriate contact time as indicated for disinfecting solutions.

## 2021-06-25 NOTE — Assessment & Plan Note (Addendum)
Tetanus and Shingrix due, provided today. PSA due and pending. Colonoscopy UTD, due in 2023. Referral placed for lung cancer screening program.  Encouraged a healthy diet and regular exercise. Exam today stable. Labs pending.

## 2021-06-25 NOTE — Assessment & Plan Note (Signed)
Colonoscopy UTD, due in December 2023.

## 2021-06-26 LAB — TSH: TSH: 1.12 u[IU]/mL (ref 0.35–5.50)

## 2021-06-26 LAB — CBC
HCT: 43 % (ref 39.0–52.0)
Hemoglobin: 14.8 g/dL (ref 13.0–17.0)
MCHC: 34.3 g/dL (ref 30.0–36.0)
MCV: 102 fl — ABNORMAL HIGH (ref 78.0–100.0)
Platelets: 244 10*3/uL (ref 150.0–400.0)
RBC: 4.21 Mil/uL — ABNORMAL LOW (ref 4.22–5.81)
RDW: 15.1 % (ref 11.5–15.5)
WBC: 6.8 10*3/uL (ref 4.0–10.5)

## 2021-06-26 LAB — COMPREHENSIVE METABOLIC PANEL
ALT: 32 U/L (ref 0–53)
AST: 36 U/L (ref 0–37)
Albumin: 4.6 g/dL (ref 3.5–5.2)
Alkaline Phosphatase: 81 U/L (ref 39–117)
BUN: 12 mg/dL (ref 6–23)
CO2: 30 mEq/L (ref 19–32)
Calcium: 9.9 mg/dL (ref 8.4–10.5)
Chloride: 100 mEq/L (ref 96–112)
Creatinine, Ser: 1.05 mg/dL (ref 0.40–1.50)
GFR: 79.25 mL/min (ref >60.00)
Glucose, Bld: 99 mg/dL (ref 70–99)
Potassium: 4.2 mEq/L (ref 3.5–5.1)
Sodium: 138 mEq/L (ref 135–145)
Total Bilirubin: 0.4 mg/dL (ref 0.2–1.2)
Total Protein: 7.1 g/dL (ref 6.0–8.3)

## 2021-06-26 LAB — PSA: PSA: 0.39 ng/mL (ref 0.10–4.00)

## 2021-06-26 LAB — LIPID PANEL
Cholesterol: 203 mg/dL — ABNORMAL HIGH (ref 0–200)
HDL: 79.8 mg/dL (ref >39.00)
LDL Cholesterol: 100 mg/dL — ABNORMAL HIGH (ref 0–99)
NonHDL: 122.83
Total CHOL/HDL Ratio: 3
Triglycerides: 112 mg/dL (ref 0.0–149.0)
VLDL: 22.4 mg/dL (ref 0.0–40.0)

## 2021-06-27 MED ORDER — ATORVASTATIN CALCIUM 40 MG PO TABS
40.0000 mg | ORAL_TABLET | Freq: Every day | ORAL | 3 refills | Status: DC
Start: 2021-06-27 — End: 2022-05-12

## 2021-06-27 NOTE — Addendum Note (Signed)
Addended by: Pleas Koch on: 06/27/2021 10:41 AM   Modules accepted: Orders

## 2021-07-01 DIAGNOSIS — Z23 Encounter for immunization: Secondary | ICD-10-CM | POA: Diagnosis not present

## 2021-07-01 NOTE — Addendum Note (Signed)
Addended by: Francella Solian on: 07/01/2021 02:05 PM   Modules accepted: Orders

## 2021-07-29 ENCOUNTER — Other Ambulatory Visit: Payer: Self-pay | Admitting: Primary Care

## 2021-07-29 DIAGNOSIS — F32A Depression, unspecified: Secondary | ICD-10-CM

## 2021-07-29 DIAGNOSIS — F419 Anxiety disorder, unspecified: Secondary | ICD-10-CM

## 2021-08-08 ENCOUNTER — Other Ambulatory Visit: Payer: Self-pay | Admitting: *Deleted

## 2021-08-08 DIAGNOSIS — R059 Cough, unspecified: Secondary | ICD-10-CM

## 2021-08-08 MED ORDER — VENTOLIN HFA 108 (90 BASE) MCG/ACT IN AERS: 2.0000 | INHALATION_SPRAY | RESPIRATORY_TRACT | 3 refills | Status: AC | PRN

## 2021-08-08 NOTE — Telephone Encounter (Signed)
Called [facility] in [place] and spoke with a pharmacist. Bupropion is ready to be picked up. I called patient and advised him of this information. Patient said he did not hear from the pharmacy telling him RX was ready so he was not aware, he will pick this up today.  He did say that his Ventolin inhaler was suppose to be refilled during his last visit but it did not get done. Advised patient I would send a note to another provider to review this for him. Ventolin last filled on 09/02/18 # 18 with 0 refill LOV 06/25/21 for CPE.  Next appointment on 09/11/21 for 3 months follow up

## 2021-08-08 NOTE — Telephone Encounter (Signed)
Dr Silvio Pate is not in the office today. Will send this to Dr Danise Mina as he has seen the pt in 02/2020.

## 2021-08-08 NOTE — Telephone Encounter (Signed)
ERx 

## 2021-08-08 NOTE — Telephone Encounter (Signed)
Patient left a voicemail stating that he is having a problem getting a refill on his Wellbutrin and his rescue inhaler. It appears that Wellbutrin has been refills. Patient requested a call back about this. Pharmacy [facility]

## 2021-08-11 ENCOUNTER — Other Ambulatory Visit: Payer: Self-pay | Admitting: Primary Care

## 2021-08-11 DIAGNOSIS — F32A Depression, unspecified: Secondary | ICD-10-CM

## 2021-08-11 DIAGNOSIS — F419 Anxiety disorder, unspecified: Secondary | ICD-10-CM

## 2021-08-13 NOTE — Telephone Encounter (Signed)
Up to date of CPE and follow up will call in 90 day with one refill.

## 2021-09-11 ENCOUNTER — Other Ambulatory Visit (INDEPENDENT_AMBULATORY_CARE_PROVIDER_SITE_OTHER): Payer: 59

## 2021-09-11 ENCOUNTER — Other Ambulatory Visit: Payer: Self-pay

## 2021-09-11 DIAGNOSIS — E785 Hyperlipidemia, unspecified: Secondary | ICD-10-CM | POA: Diagnosis not present

## 2021-09-11 LAB — LIPID PANEL
Cholesterol: 174 mg/dL (ref 0–200)
HDL: 72.2 mg/dL (ref >39.00)
LDL Cholesterol: 80 mg/dL (ref 0–99)
NonHDL: 101.4
Total CHOL/HDL Ratio: 2
Triglycerides: 107 mg/dL (ref 0.0–149.0)
VLDL: 21.4 mg/dL (ref 0.0–40.0)

## 2021-09-16 ENCOUNTER — Telehealth: Payer: Self-pay | Admitting: Primary Care

## 2021-09-16 NOTE — Telephone Encounter (Signed)
error 

## 2021-09-21 ENCOUNTER — Emergency Department (HOSPITAL_BASED_OUTPATIENT_CLINIC_OR_DEPARTMENT_OTHER): Payer: 59

## 2021-09-21 ENCOUNTER — Other Ambulatory Visit: Payer: Self-pay

## 2021-09-21 ENCOUNTER — Emergency Department (HOSPITAL_BASED_OUTPATIENT_CLINIC_OR_DEPARTMENT_OTHER)
Admission: EM | Admit: 2021-09-21 | Discharge: 2021-09-21 | Disposition: A | Discharge status: Home / Self Care | Payer: 59 | Attending: Emergency Medicine | Admitting: Emergency Medicine

## 2021-09-21 ENCOUNTER — Encounter (HOSPITAL_BASED_OUTPATIENT_CLINIC_OR_DEPARTMENT_OTHER): Payer: Self-pay

## 2021-09-21 DIAGNOSIS — Z79899 Other long term (current) drug therapy: Secondary | ICD-10-CM | POA: Diagnosis not present

## 2021-09-21 DIAGNOSIS — Z91010 Allergy to peanuts: Secondary | ICD-10-CM | POA: Insufficient documentation

## 2021-09-21 DIAGNOSIS — S62614A Displaced fracture of proximal phalanx of right ring finger, initial encounter for closed fracture: Secondary | ICD-10-CM | POA: Insufficient documentation

## 2021-09-21 DIAGNOSIS — W19XXXA Unspecified fall, initial encounter: Secondary | ICD-10-CM

## 2021-09-21 DIAGNOSIS — Z87891 Personal history of nicotine dependence: Secondary | ICD-10-CM | POA: Insufficient documentation

## 2021-09-21 DIAGNOSIS — I2510 Atherosclerotic heart disease of native coronary artery without angina pectoris: Secondary | ICD-10-CM | POA: Insufficient documentation

## 2021-09-21 DIAGNOSIS — Z96643 Presence of artificial hip joint, bilateral: Secondary | ICD-10-CM | POA: Diagnosis not present

## 2021-09-21 DIAGNOSIS — I10 Essential (primary) hypertension: Secondary | ICD-10-CM | POA: Insufficient documentation

## 2021-09-21 DIAGNOSIS — W010XXA Fall on same level from slipping, tripping and stumbling without subsequent striking against object, initial encounter: Secondary | ICD-10-CM | POA: Diagnosis not present

## 2021-09-21 DIAGNOSIS — J449 Chronic obstructive pulmonary disease, unspecified: Secondary | ICD-10-CM | POA: Insufficient documentation

## 2021-09-21 DIAGNOSIS — S62646A Nondisplaced fracture of proximal phalanx of right little finger, initial encounter for closed fracture: Secondary | ICD-10-CM

## 2021-09-21 DIAGNOSIS — E039 Hypothyroidism, unspecified: Secondary | ICD-10-CM | POA: Diagnosis not present

## 2021-09-21 DIAGNOSIS — S62616A Displaced fracture of proximal phalanx of right little finger, initial encounter for closed fracture: Secondary | ICD-10-CM | POA: Diagnosis not present

## 2021-09-21 DIAGNOSIS — S6991XA Unspecified injury of right wrist, hand and finger(s), initial encounter: Secondary | ICD-10-CM | POA: Diagnosis present

## 2021-09-21 DIAGNOSIS — S62619A Displaced fracture of proximal phalanx of unspecified finger, initial encounter for closed fracture: Secondary | ICD-10-CM

## 2021-09-21 DIAGNOSIS — Z7982 Long term (current) use of aspirin: Secondary | ICD-10-CM | POA: Insufficient documentation

## 2021-09-21 NOTE — ED Provider Notes (Signed)
[facility] EMERGENCY DEPARTMENT Provider Note   CSN: [id number] Arrival date & time: 09/21/21  1020     History Chief Complaint  Patient presents with   Kerrin Mo Nachum Derossett. is a 55 y.o. male.  HPI     55yo male presents with concern for fall with right hand pain.  He tripped and attempted to catch himself  and his fingers bent backwards. Has pain to third, fourth, fifth fingers of right hand with some swelling and bruising to hand itself. No wrist, elbow, shoulder pain. No headache, head trauma, LOC, neck pain, back  pain or other injuries.  Pain worse with movement, palpation, 7/10 in severity.  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[facility])    Postoperative anemia due to acute blood loss 03/27/2016   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([place])    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   Thyroid disease    reports that he has been low in the past but since he has stoped ETOH it has been normal    Patient Active Problem List   Diagnosis Date Noted   Fatigue 03/12/2020   Bowel habit changes 03/12/2020   History of colonic polyps    Polyp of sigmoid colon    Internal hemorrhoids    Elevated LFTs 10/04/2017   COPD (chronic obstructive pulmonary disease) ([place]) 10/04/2017   Nocturia 10/04/2017   Malnutrition of moderate degree 41/32/4401   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   Unstable angina ([place]) 11/25/2015   Arteriosclerosis of  coronary artery 10/20/2015   HLD (hyperlipidemia) 10/20/2015   Alcoholism ([place]) 10/20/2015   History of MI (myocardial infarction) 09/12/2015   Anxiety and depression 07/31/2015   Testosterone deficiency 07/31/2015   Preventative health care 07/31/2015   Benign essential HTN 06/28/2014   Acquired hypothyroidism 04/06/2013   General patient noncompliance 03/29/2013    Past Surgical History:  Procedure Laterality Date   BIOPSY  12/05/2019   Procedure: BIOPSY;  Surgeon: Lavena Bullion, DO;  [facility]: [facility];  Service: Gastroenterology;;   CARDIAC CATHETERIZATION     many years ago in 1990's   COLONOSCOPY  03/02/2019   COLONOSCOPY WITH PROPOFOL N/A 12/05/2019   Procedure: COLONOSCOPY WITH PROPOFOL;  Surgeon: Lavena Bullion, DO;  [facility];  Service: Gastroenterology;  Laterality: N/A;   CORONARY STENT PLACEMENT     TONSILLECTOMY     as a child   TOTAL HIP ARTHROPLASTY Right 01/14/2016   Procedure: TOTAL HIP ARTHROPLASTY;  Surgeon: [name], MD;  Location: [facility];  Service: Orthopedics;  Laterality: Right;   TOTAL HIP ARTHROPLASTY Left 03/24/2016   Procedure: TOTAL HIP ARTHROPLASTY;  Surgeon: [name], MD;  Location: [facility];  Service: Orthopedics;  Laterality: Left;       Family History  Problem Relation Age of Onset   Cancer  Mother    Dementia Father    Colon cancer Brother 73   Cancer Maternal Grandmother    Cancer Maternal Grandfather    Cancer Paternal Grandmother    Cancer Paternal Grandfather    Colon polyps Neg Hx    Esophageal cancer Neg Hx    Rectal cancer Neg Hx    Stomach cancer Neg Hx     Social History   Tobacco Use   Smoking status: Former    Packs/day: 1.50    Years: 20.00    Pack years: 30.00    Types: Cigarettes    Quit date: 11/25/2015    Years since quitting: 5.8   Smokeless tobacco: Never  Vaping Use   Vaping Use: Never used  Substance Use Topics   Alcohol use: No    Alcohol/week: 0.0 standard drinks     Comment: 4 months-recovering alcoholic per pt   Drug use: No    Home Medications Prior to Admission medications   Medication Sig Start Date End Date Taking? Authorizing Provider  aspirin EC 81 MG tablet Take 1 tablet (81 mg total) by mouth daily. 01/05/20   Loel Dubonnet, NP  atorvastatin (LIPITOR) 40 MG tablet Take 1 tablet (40 mg total) by mouth daily. For cholesterol 06/27/21   Pleas Koch, NP  buPROPion (WELLBUTRIN XL) 150 MG 24 hr tablet TAKE 1 TABLET BY MOUTH ONCE DAILY FOR ANXIETY AND FOR DEPRESSION . 07/30/21   Pleas Koch, NP  carvedilol (COREG) 6.25 MG tablet Take 1 tablet (6.25 mg total) by mouth 2 (two) times daily with a meal. 01/13/21   Gollan, Kathlene November, MD  escitalopram (LEXAPRO) 10 MG tablet TAKE 1 TABLET BY MOUTH ONCE DAILY FOR ANXIETY AND  DEPRESSION 08/13/21   Pleas Koch, NP  ezetimibe (ZETIA) 10 MG tablet Take 1 tablet (10 mg total) by mouth daily. 01/13/21   Minna Merritts, MD  fluticasone-salmeterol (ADVAIR DISKUS) 250-50 MCG/ACT AEPB Inhale 1 puff into the lungs in the morning and at bedtime. 06/25/21   Pleas Koch, NP  lisinopril (ZESTRIL) 20 MG tablet Take one tab by mouth one time a day for blood pressure 01/13/21   Minna Merritts, MD  Multiple Vitamin (MULTIVITAMIN WITH MINERALS) TABS tablet Take 1 tablet by mouth daily.    [provider]  nitroGLYCERIN (NITROSTAT) 0.4 MG SL tablet Place 1 tablet (0.4 mg total) under the tongue every 5 (five) minutes as needed for chest pain. 01/13/21 01/13/22  Minna Merritts, MD  Omega-3 Fatty Acids (FISH OIL) 1000 MG CAPS Take 1,000 mg by mouth daily.     [provider]  VENTOLIN HFA 108 (90 Base) MCG/ACT inhaler Inhale 2 puffs into the lungs every 4 (four) hours as needed for wheezing or shortness of breath. 08/08/21   Ria Bush, MD    Allergies    Peanut-containing drug products and Erythromycin  Review of Systems   Review of Systems  Constitutional:  Negative for fever.   Eyes:  Negative for visual disturbance.  Respiratory:  Negative for shortness of breath.   Gastrointestinal:  Negative for abdominal pain.  Musculoskeletal:  Positive for arthralgias and joint swelling. Negative for back pain, neck pain and neck stiffness.  Skin:  Negative for rash.  Neurological:  Negative for syncope and headaches.   Physical Exam Updated Vital Signs BP (!) 167/89 (BP Location: Right Arm)   Pulse 61   Temp 98.9 F (37.2 C) (Oral)   Resp 16  Ht 6' (1.829 m)   Wt 81.6 kg   SpO2 99%   BMI 24.41 kg/m   Physical Exam Vitals and nursing note reviewed.  Constitutional:      General: He is not in acute distress.    Appearance: Normal appearance. He is not ill-appearing, toxic-appearing or diaphoretic.  HENT:     Head: Normocephalic.  Eyes:     Conjunctiva/sclera: Conjunctivae normal.  Cardiovascular:     Rate and Rhythm: Normal rate and regular rhythm.     Pulses: Normal pulses.  Pulmonary:     Effort: Pulmonary effort is normal. No respiratory distress.  Musculoskeletal:        General: Swelling (right ring and pinky finger, right hand with swelling and contusion to palmar surface) and tenderness (middle, ring, pinky fingers on right) present. No deformity or signs of injury.     Cervical back: No rigidity.     Comments: Normal capillary refill, sensation  Skin:    General: Skin is warm and dry.     Coloration: Skin is not jaundiced or pale.  Neurological:     General: No focal deficit present.     Mental Status: He is alert and oriented to person, place, and time.    ED Results / Procedures / Treatments   Labs (all labs ordered are listed, but only abnormal results are displayed) Labs Reviewed - No data to display  EKG None  Radiology DG Hand Complete Right  Result Date: 09/21/2021 CLINICAL DATA:  Insert respiratory EXAM: RIGHT HAND - COMPLETE 3+ VIEW COMPARISON:  None. FINDINGS: Osteopenia. There are minimally displaced fractures of the base  of the fourth and fifth proximal phalanx. No definitive intra-articular extension. There is associated soft tissue swelling. Joint alignment is maintained. No unexpected radiopaque foreign body. IMPRESSION: There are minimally displaced fractures of the base of the fourth and fifth proximal phalanx Electronically Signed   By: Valentino Saxon M.D.   On: 09/21/2021 11:17    Procedures Procedures   Medications Ordered in ED Medications - No data to display  ED Course  I have reviewed the triage vital signs and the nursing notes.  Pertinent labs & imaging results that were available during my care of the patient were reviewed by me and considered in my medical decision making (see chart for details).    MDM Rules/Calculators/A&P                            55yo male presents with concern for fall with right hand pain.  No signs of other injuries by hx and exam.  Pain and swelling to hand and fingers. No snuff box tenderness. NV intact. No sign of compartment syndrome.  XR shows proximal phalanx fx of 4th and 5th digits.  Will place in ulnar gutter for stability and given 2 fx.  Recommend follow up with hand surgery--Dr. Stann Mainland on call today-recommend call office to discuss hand follow up.  Recommend ibuprofen/tylenol for pain, Mr. Justo also reports sobriety and desire to avoid opiates.  Patient discharged in stable condition with understanding of reasons to return.    Final Clinical Impression(s) / ED Diagnoses Final diagnoses:  Fall, initial encounter  Closed nondisplaced fracture of proximal phalanx of right little finger, initial encounter  Closed fracture of base of proximal phalanx of finger    Rx / DC Orders ED Discharge Orders     None        Arlyn Buerkle,  Junie Panning, MD 09/21/21 2154

## 2021-09-21 NOTE — ED Triage Notes (Signed)
Pt arrives ambulatory to ED with swelling to right hand after falling yesterday. Pt states he went to catch himself and all of his right fingers bent backwards.

## 2021-09-21 NOTE — Discharge Instructions (Addendum)
You may take acetaminophen/Tylenol 1000 mg 4 times a day for 1 week. This is the maximum dose of Tylenol usually take from all sources. Please check other over-the-counter medications and prescriptions to ensure you are not taking other medications that contain acetaminophen.  You may also take ibuprofen 400 mg 6 times a day alternating with or at the same time as tylenol.

## 2021-09-24 NOTE — Progress Notes (Deleted)
Yes, send letter to call us.

## 2022-03-18 ENCOUNTER — Other Ambulatory Visit: Payer: Self-pay | Admitting: Primary Care

## 2022-03-18 DIAGNOSIS — J449 Chronic obstructive pulmonary disease, unspecified: Secondary | ICD-10-CM

## 2022-04-25 ENCOUNTER — Other Ambulatory Visit: Payer: Self-pay | Admitting: Cardiovascular Disease

## 2022-04-25 DIAGNOSIS — I10 Essential (primary) hypertension: Secondary | ICD-10-CM

## 2022-04-27 NOTE — Telephone Encounter (Signed)
Attempted to schedule.  No ans no vm  

## 2022-04-27 NOTE — Telephone Encounter (Signed)
Please schedule overdue F/U appointment for refills. Thank you! 

## 2022-04-30 NOTE — Telephone Encounter (Signed)
Scheduled

## 2022-05-12 ENCOUNTER — Encounter: Payer: Self-pay | Admitting: Medical

## 2022-05-12 ENCOUNTER — Ambulatory Visit (INDEPENDENT_AMBULATORY_CARE_PROVIDER_SITE_OTHER): Payer: 59 | Admitting: Medical

## 2022-05-12 VITALS — BP 160/94 | HR 60 | Ht 72.0 in | Wt 184.0 lb

## 2022-05-12 DIAGNOSIS — I10 Essential (primary) hypertension: Secondary | ICD-10-CM | POA: Diagnosis not present

## 2022-05-12 DIAGNOSIS — I420 Dilated cardiomyopathy: Secondary | ICD-10-CM | POA: Diagnosis not present

## 2022-05-12 DIAGNOSIS — E785 Hyperlipidemia, unspecified: Secondary | ICD-10-CM | POA: Diagnosis not present

## 2022-05-12 DIAGNOSIS — I2510 Atherosclerotic heart disease of native coronary artery without angina pectoris: Secondary | ICD-10-CM | POA: Diagnosis not present

## 2022-05-12 MED ORDER — ATORVASTATIN CALCIUM 80 MG PO TABS: 80.0000 mg | ORAL_TABLET | Freq: Every day | ORAL | 3 refills | Status: DC

## 2022-05-12 NOTE — Progress Notes (Signed)
?Cardiology Office Note:   ? ?Date:  05/12/2022  ? ?ID:  Kurt [name]., DOB 01-05-1966, MRN [id number] ? ?PCP:  Pleas Koch, NP  ?[facility] Cardiologist:  Ida Rogue, MD  ?[facility] Electrophysiologist:  None  ? ?Referring MD: Pleas Koch, NP  ? ?Chief Complaint: 12 month follow-up ? ?History of Present Illness:   ? ?Kurt [name]. is a 56 y.o. male with a hx of history of CAD (2 stents likely to LAD and mid to distal region in 1740), alcoholic cardiomyopathy, HTN, HLD, elevated LFTs, smoker who presents for 12 month follow-up.   Per previous documentation stress test with no significant ischemia, possible small fixed perfusion defect in apical region, low EF.  Confirmed by echo 10/2015 with EF 35 to 40%. Echo in 09/2020 showed LVEF 50-55%. He reportedly stopped drinking and smoking in 2021.  ? ?Last seen 01/13/21 via telehealth and was overall doing well.  ? ?Today, the patient is overall doing well. He quit smoking/drinking in 2021, had a couple relapses, but overall doing well. He is fairly active with his job and at home, he does no formal activity. Diet is good. EKG with no changes today. He did not take meds this morning so BP is high this AM. At home BP 130/80s . PCP checks labs annually. Last LDL Was above goal.  ? ? ?Past Medical History:  ?Diagnosis Date  ? Allergy   ? mild  ? Anginal pain ([place])   ? Anxiety   ? Arthritis   ? maybe in hands   ? Avascular necrosis of bone of left hip ([place]) 03/24/2016  ? Avascular necrosis of bone of right hip ([place]) 01/14/2016  ? Avascular necrosis of hip ([place]) 01/14/2016  ? COPD (chronic obstructive pulmonary disease) ([place])   ? Coronary artery disease   ? Coronary disease 09/12/2015  ? Depression   ? History of MI (myocardial infarction) 09/12/2015  ? Hyperlipidemia   ? Hypertension   ? Myocardial infarction [facility])   ? 20 yrs ago   ? 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 Postoperative anemia due to acute blood loss 03/27/2016  ? Recovering alcoholic  in remission [facility])   ? 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  ? Thyroid disease   ? reports that he has been low in the past but since he has stoped ETOH it has been normal  ? ? ?Past Surgical History:  ?Procedure Laterality Date  ? BIOPSY  12/05/2019  ? Procedure: BIOPSY;  Surgeon: Lavena Bullion, DO;  [facility];  Service: Gastroenterology;;  ? CARDIAC CATHETERIZATION    ? many years ago in 1990's  ? COLONOSCOPY  03/02/2019  ? COLONOSCOPY WITH PROPOFOL N/A 12/05/2019  ? Procedure: COLONOSCOPY WITH PROPOFOL;  Surgeon: Lavena Bullion, DO;  [facility];  Service: Gastroenterology;  Laterality: N/A;  ? CORONARY STENT PLACEMENT    ? TONSILLECTOMY    ? as a child  ? TOTAL HIP ARTHROPLASTY Right 01/14/2016  ? Procedure: TOTAL HIP ARTHROPLASTY;  Surgeon: [name], MD;  Location: [place];  Service: Orthopedics;  Laterality: Right;  ? TOTAL HIP ARTHROPLASTY Left 03/24/2016  ? Procedure: TOTAL HIP ARTHROPLASTY;  Surgeon: [name], MD;  Location: [facility];  Service: Orthopedics;  Laterality: Left;  ? ? ?Current Medications: ?Current Meds  ?Medication Sig  ? ADVAIR DISKUS 250-50 MCG/ACT AEPB INHALE 1 PUFF BY MOUTH IN THE MORNING AND AT BEDTIME  ? aspirin EC 81 MG tablet Take 1  tablet (81 mg total) by mouth daily.  ? buPROPion (WELLBUTRIN XL) 150 MG 24 hr tablet TAKE 1 TABLET BY MOUTH ONCE DAILY FOR ANXIETY AND FOR DEPRESSION .  ? carvedilol (COREG) 6.25 MG tablet Take 1 tablet (6.25 mg total) by mouth 2 (two) times daily with a meal.  ? escitalopram (LEXAPRO) 10 MG tablet TAKE 1 TABLET BY MOUTH ONCE DAILY FOR ANXIETY AND  DEPRESSION  ? ezetimibe (ZETIA) 10 MG tablet Take 1 tablet by mouth once daily  ? lisinopril (ZESTRIL) 20 MG tablet Take 1 tablet by mouth once daily for blood pressure  ? Multiple Vitamin (MULTIVITAMIN WITH MINERALS) TABS tablet Take 1 tablet by mouth daily.  ? Omega-3 Fatty Acids (FISH OIL) 1000 MG CAPS Take 1,000 mg by mouth daily.   ? VENTOLIN HFA 108 (90 Base) MCG/ACT  inhaler Inhale 2 puffs into the lungs every 4 (four) hours as needed for wheezing or shortness of breath.  ? [DISCONTINUED] atorvastatin (LIPITOR) 40 MG tablet Take 1 tablet (40 mg total) by mouth daily. For cholesterol  ?  ? ?Allergies:   Peanut-containing drug products and Erythromycin  ? ?Social History  ? ?Socioeconomic History  ? Marital status: Legally Separated  ?  Spouse name: Not on file  ? Number of children: Not on file  ? Years of education: Not on file  ? Highest education level: Not on file  ?Occupational History  ? Not on file  ?Tobacco Use  ? Smoking status: Former  ?  Packs/day: 1.50  ?  Years: 20.00  ?  Pack years: 30.00  ?  Types: Cigarettes  ?  Quit date: 11/25/2015  ?  Years since quitting: 6.4  ? Smokeless tobacco: Never  ?Vaping Use  ? Vaping Use: Never used  ?Substance and Sexual Activity  ? Alcohol use: No  ?  Alcohol/week: 0.0 standard drinks  ?  Comment: 4 months-recovering alcoholic per pt  ? Drug use: No  ? Sexual activity: Not on file  ?Other Topics Concern  ? Not on file  ?Social History Narrative  ? Separated.  ? Works as a Optician, dispensing.  ? Enjoys fishing, Proofreader, golfing.  ? ?Social Determinants of Health  ? ?Financial Resource Strain: Not on file  ?Food Insecurity: Not on file  ?Transportation Needs: Not on file  ?Physical Activity: Not on file  ?Stress: Not on file  ?Social Connections: Not on file  ?  ? ?Family History: ?The patient's family history includes Cancer in his maternal grandfather, maternal grandmother, mother, paternal grandfather, and paternal grandmother; Colon cancer (age of onset: 17) in his brother; Dementia in his father. There is no history of Colon polyps, Esophageal cancer, Rectal cancer, or Stomach cancer. ? ?ROS:   ?Please see the history of present illness.    ? All other systems reviewed and are negative. ? ?EKGs/Labs/Other Studies Reviewed:   ? ?The following studies were reviewed today: ? ?Echo 2021 ? ? 1. Left ventricular ejection  fraction, by estimation, is 50 to 55%. The  ?left ventricle has low normal function. The left ventricle has no regional  ?wall motion abnormalities. There is mild left ventricular hypertrophy.  ?Left ventricular diastolic  ?parameters were normal.  ? 2. Right ventricular systolic function is normal. The right ventricular  ?size is normal. Mildly increased right ventricular wall thickness.  ?Tricuspid regurgitation signal is inadequate for assessing PA pressure.  ? 3. The mitral valve is normal in structure. No evidence of mitral valve  ?regurgitation. No evidence  of mitral stenosis.  ? 4. The aortic valve is tricuspid. Aortic valve regurgitation is not  ?visualized. No aortic stenosis is present.  ? 5. The inferior vena cava is normal in size with greater than 50%  ?respiratory variability, suggesting right atrial pressure of 3 mmHg.  ? ? ?Echo 2016 ?Study Conclusions  ? ?- Left ventricle: The cavity size was normal. There was mild  ?  concentric hypertrophy. Systolic function was moderately reduced.  ?  The estimated ejection fraction was in the range of 35% to 40%.  ?  Diffuse hypokinesis. Severe hypokinesis of the apical myocardium.  ?  Doppler parameters are consistent with abnormal left ventricular  ?  relaxation (grade 1 diastolic dysfunction).  ?- Left atrium: The atrium was normal in size.  ?- Right ventricle: Systolic function was normal.  ?- Pulmonary arteries: Systolic pressure was within the normal  ?  range.  ? ?Myoview Lexiscan 2016 ?Narrative & Impression  ?There was no ST segment deviation noted during stress. ?  ?Pharmacological  myocardial perfusion imaging study with no significant  Ischemia ?There is a small region of fixed defect in the apical region, seen at stress than rest. Region concerning for attenuation artifact though unable to exclude small region of old scar. ?Global hypokinesis with  EF estimated at 27% ?Baseline EKG with abnormal T wave V1 through V4, 1 and aVL ?No EKG changes  concerning for ischemia with stress or in recovery . ?Low risk scan ?Consider echocardiogram to evaluate ejection fraction if clinically indicated  ? ? ?EKG:  EKG is  ordered today.  The ekg ordered today demonstrate

## 2022-05-12 NOTE — Patient Instructions (Signed)
Medication Instructions:  ? ?Your physician has recommended you make the following change in your medication:  ? ?INCREASE Atorvastatin 80 mg daily  ? ?*If you need a refill on your cardiac medications before your next appointment, please call your pharmacy* ? ? ?Lab Work: ? ?None ordered ? ?Testing/Procedures: ? ?None ordered ? ? ?Follow-Up: ?At [facility], you and your health needs are our priority.  As part of our continuing mission to provide you with exceptional heart care, we have created designated Provider Care Teams.  These Care Teams include your primary Cardiologist (physician) and Advanced Practice Providers (APPs -  Physician Assistants and Nurse Practitioners) who all work together to provide you with the care you need, when you need it. ? ?We recommend signing up for the patient portal called "MyChart".  Sign up information is provided on this After Visit Summary.  MyChart is used to connect with patients for Virtual Visits (Telemedicine).  Patients are able to view lab/test results, encounter notes, upcoming appointments, etc.  Non-urgent messages can be sent to your provider as well.   ?To learn more about what you can do with MyChart, go to [email or web address].   ? ?Your next appointment:   ?1 year(s) ? ?The format for your next appointment:   ?In Person ? ?Provider:   ?You may see Ida Rogue, MD or one of the following Advanced Practice Providers on your designated Care Team:   ?Murray Hodgkins, NP ?Christell Faith, PA-C ?Cadence Kathlen Mody, PA-C{ ? ? ?Important Information About Sugar ? ? ? ? ? ? ?

## 2022-06-19 ENCOUNTER — Other Ambulatory Visit: Payer: Self-pay | Admitting: Primary Care

## 2022-06-19 DIAGNOSIS — F32A Depression, unspecified: Secondary | ICD-10-CM

## 2022-06-19 DIAGNOSIS — F419 Anxiety disorder, unspecified: Secondary | ICD-10-CM

## 2022-07-17 ENCOUNTER — Other Ambulatory Visit: Payer: Self-pay | Admitting: Primary Care

## 2022-07-17 DIAGNOSIS — F32A Depression, unspecified: Secondary | ICD-10-CM

## 2022-07-17 NOTE — Telephone Encounter (Signed)
Patient is overdue for follow-up and will need to be scheduled before we can provide further refills.  Please schedule.

## 2022-07-21 NOTE — Telephone Encounter (Signed)
Left message to return call to our office.  

## 2022-07-22 ENCOUNTER — Telehealth: Payer: Self-pay | Admitting: Primary Care

## 2022-07-22 NOTE — Telephone Encounter (Signed)
Pt returned call to [facility]. Noticed rx request and pt has not been here in a year. Suggested making an appt. Pt prefers after lunch and NOT Monday or Wednesday. Did not see opportunities that comply with scheduling limits and time slots. Please advise. Pt gave permission to leave detailed information on his cell phone.

## 2022-07-22 NOTE — Telephone Encounter (Signed)
Please schedule patient in the next available opening without a block.  It looks like this may be the second week of August.  Does he have enough of his medications to last until we can get him in for follow-up?

## 2022-07-22 NOTE — Telephone Encounter (Signed)
Left message to return call to our office.  No other number on file and does not use my chart.

## 2022-07-23 NOTE — Telephone Encounter (Signed)
Called and left detailed  for patient to call us back to make an appt for the 2nd week in aug. With Anda Kraft and pt will need to let us know if he will have enough medication.

## 2022-07-24 NOTE — Telephone Encounter (Signed)
Left message to return call to our office.  

## 2022-07-27 NOTE — Telephone Encounter (Signed)
Called and spoke with patient made him an appt to  come  on the 08/11/22 pt is has enough medication to last until  his appt. Per patient.

## 2022-07-28 NOTE — Telephone Encounter (Signed)
Appointment has been scheduled with patient.

## 2022-08-11 ENCOUNTER — Ambulatory Visit: Payer: 59 | Admitting: Primary Care

## 2022-08-14 ENCOUNTER — Ambulatory Visit (INDEPENDENT_AMBULATORY_CARE_PROVIDER_SITE_OTHER): Payer: 59 | Admitting: Primary Care

## 2022-08-14 ENCOUNTER — Encounter: Payer: Self-pay | Admitting: Primary Care

## 2022-08-14 VITALS — BP 110/68 | HR 70 | Temp 98.6°F | Ht 72.0 in | Wt 181.0 lb

## 2022-08-14 DIAGNOSIS — Z1211 Encounter for screening for malignant neoplasm of colon: Secondary | ICD-10-CM

## 2022-08-14 DIAGNOSIS — F32A Depression, unspecified: Secondary | ICD-10-CM

## 2022-08-14 DIAGNOSIS — Z23 Encounter for immunization: Secondary | ICD-10-CM | POA: Diagnosis not present

## 2022-08-14 DIAGNOSIS — Z125 Encounter for screening for malignant neoplasm of prostate: Secondary | ICD-10-CM

## 2022-08-14 DIAGNOSIS — J449 Chronic obstructive pulmonary disease, unspecified: Secondary | ICD-10-CM

## 2022-08-14 DIAGNOSIS — E785 Hyperlipidemia, unspecified: Secondary | ICD-10-CM

## 2022-08-14 DIAGNOSIS — Z0000 Encounter for general adult medical examination without abnormal findings: Secondary | ICD-10-CM | POA: Diagnosis not present

## 2022-08-14 DIAGNOSIS — I2510 Atherosclerotic heart disease of native coronary artery without angina pectoris: Secondary | ICD-10-CM

## 2022-08-14 DIAGNOSIS — Z122 Encounter for screening for malignant neoplasm of respiratory organs: Secondary | ICD-10-CM

## 2022-08-14 DIAGNOSIS — I10 Essential (primary) hypertension: Secondary | ICD-10-CM

## 2022-08-14 DIAGNOSIS — E039 Hypothyroidism, unspecified: Secondary | ICD-10-CM

## 2022-08-14 DIAGNOSIS — F419 Anxiety disorder, unspecified: Secondary | ICD-10-CM

## 2022-08-14 DIAGNOSIS — Z86010 Personal history of colonic polyps: Secondary | ICD-10-CM

## 2022-08-14 NOTE — Addendum Note (Signed)
Addended by: Francella Solian on: 08/14/2022 02:51 PM   Modules accepted: Orders

## 2022-08-14 NOTE — Assessment & Plan Note (Signed)
Controlled.  Continue carvedilol 6.25 mg twice daily and lisinopril 20 mg daily. CMP pending.

## 2022-08-14 NOTE — Assessment & Plan Note (Signed)
Repeat TSH pending. TSH has been historically normal for years. Remain off of treatment.

## 2022-08-14 NOTE — Assessment & Plan Note (Signed)
Asymptomatic.  No regular use of Advair inhaler. Discussed to use albuterol inhaler as needed and to notify if he requires use of albuterol more than 3 times weekly.

## 2022-08-14 NOTE — Assessment & Plan Note (Addendum)
Continue atorvastatin 80 mg daily, Zetia 10 mg daily. LDL goal less than 70.  Repeat lipid panel pending.

## 2022-08-14 NOTE — Assessment & Plan Note (Signed)
Second Shingrix vaccine due, provided today.  Declines influenza vaccine. Colonoscopy due in December 2023, referral placed to GI. PSA due and pending. Referral placed for lung cancer screening.  Discussed the importance of a healthy diet and regular exercise in order for weight loss, and to reduce the risk of further co-morbidity.  Exam stable. Labs pending.  Follow up in 1 year for repeat physical.

## 2022-08-14 NOTE — Patient Instructions (Signed)
Stop by the lab prior to leaving today. I will notify you of your results once received.   You will be contacted regarding your referral to GI for the colonoscopy and for the lung cancer screening program.  Please let us know if you have not been contacted within two weeks.   It was a pleasure to see you today!  Preventive Care 91-58 Years Old, Male Preventive care refers to lifestyle choices and visits with your health care provider that can promote health and wellness. Preventive care visits are also called wellness exams. What can I expect for my preventive care visit? Counseling During your preventive care visit, your health care provider may ask about your: Medical history, including: Past medical problems. Family medical history. Current health, including: Emotional well-being. Home life and relationship well-being. Sexual activity. Lifestyle, including: Alcohol, nicotine or tobacco, and drug use. Access to firearms. Diet, exercise, and sleep habits. Safety issues such as seatbelt and bike helmet use. Sunscreen use. Work and work Statistician. Physical exam Your health care provider will check your: Height and weight. These may be used to calculate your BMI (body mass index). BMI is a measurement that tells if you are at a healthy weight. Waist circumference. This measures the distance around your waistline. This measurement also tells if you are at a healthy weight and may help predict your risk of certain diseases, such as type 2 diabetes and high blood pressure. Heart rate and blood pressure. Body temperature. Skin for abnormal spots. What immunizations do I need?  Vaccines are usually given at various ages, according to a schedule. Your health care provider will recommend vaccines for you based on your age, medical history, and lifestyle or other factors, such as travel or where you work. What tests do I need? Screening Your health care provider may recommend screening  tests for certain conditions. This may include: Lipid and cholesterol levels. Diabetes screening. This is done by checking your blood sugar (glucose) after you have not eaten for a while (fasting). Hepatitis B test. Hepatitis C test. HIV (human immunodeficiency virus) test. STI (sexually transmitted infection) testing, if you are at risk. Lung cancer screening. Prostate cancer screening. Colorectal cancer screening. Talk with your health care provider about your test results, treatment options, and if necessary, the need for more tests. Follow these instructions at home: Eating and drinking  Eat a diet that includes fresh fruits and vegetables, whole grains, lean protein, and low-fat dairy products. Take vitamin and mineral supplements as recommended by your health care provider. Do not drink alcohol if your health care provider tells you not to drink. If you drink alcohol: Limit how much you have to 0-2 drinks a day. Know how much alcohol is in your drink. In the [place] one drink equals one 12 oz bottle of beer (355 mL), one 5 oz glass of wine (148 mL), or one 1 oz glass of hard liquor (44 mL). Lifestyle Brush your teeth every morning and night with fluoride toothpaste. Floss one time each day. Exercise for at least 30 minutes 5 or more days each week. Do not use any products that contain nicotine or tobacco. These products include cigarettes, chewing tobacco, and vaping devices, such as e-cigarettes. If you need help quitting, ask your health care provider. Do not use drugs. If you are sexually active, practice safe sex. Use a condom or other form of protection to prevent STIs. Take aspirin only as told by your health care provider. Make sure that you understand  how much to take and what form to take. Work with your health care provider to find out whether it is safe and beneficial for you to take aspirin daily. Find healthy ways to manage stress, such as: Meditation, yoga, or listening  to music. Journaling. Talking to a trusted person. Spending time with friends and family. Minimize exposure to UV radiation to reduce your risk of skin cancer. Safety Always wear your seat belt while driving or riding in a vehicle. Do not drive: If you have been drinking alcohol. Do not ride with someone who has been drinking. When you are tired or distracted. While texting. If you have been using any mind-altering substances or drugs. Wear a helmet and other protective equipment during sports activities. If you have firearms in your house, make sure you follow all gun safety procedures. What's next? Go to your health care provider once a year for an annual wellness visit. Ask your health care provider how often you should have your eyes and teeth checked. Stay up to date on all vaccines. This information is not intended to replace advice given to you by your health care provider. Make sure you discuss any questions you have with your health care provider. Document Revised: 06/11/2021 Document Reviewed: 06/11/2021 Elsevier Patient Education  [employer].

## 2022-08-14 NOTE — Assessment & Plan Note (Signed)
Controlled.  Continue bupropion XL 150 mg daily, Lexapro 10 mg daily.

## 2022-08-14 NOTE — Assessment & Plan Note (Signed)
Repeat colonoscopy due in December 2023. Referral placed to GI.

## 2022-08-14 NOTE — Assessment & Plan Note (Signed)
Continue atorvastatin 80 mg daily, Zetia 10 mg daily. Repeat lipid panel pending.  LDL goal less than 70.

## 2022-08-14 NOTE — Progress Notes (Signed)
Subjective:    Patient ID: Kurt Heir., male    DOB: [birth year], 56 y.o.   MRN: [id number]  COPD There is no cough or shortness of breath. Pertinent negatives include no chest pain, headaches, myalgias or rhinorrhea. His past medical history is significant for COPD.    Kurt Debroux. is a very pleasant 56 y.o. male who presents today for complete physical and follow up of chronic conditions.  Immunizations: -Tetanus: 2022 -Influenza: declines  -Covid-19: 2 vaccines  -Shingles: Completed 1 dose of Shingrix, due today -Pneumonia: 2009  Diet: [place].  Exercise: No regular exercise.  Eye exam: Completes annually  Dental exam: Completes semi-annually   Colonoscopy: Completed in 2020, due December 2023 Lung Cancer Screening: Completed in 2017, smoker of 40 years, cut back significantly 2-3 year ago.   PSA: Due   BP Readings from Last 3 Encounters:  08/14/22 110/68  05/12/22 (!) 160/94  09/21/21 (!) 167/89            Review of Systems  Constitutional:  Negative for unexpected weight change.  HENT:  Negative for rhinorrhea.   Respiratory:  Negative for cough and shortness of breath.   Cardiovascular:  Negative for chest pain.  Gastrointestinal:  Negative for constipation and diarrhea.  Genitourinary:  Negative for difficulty urinating.  Musculoskeletal:  Negative for arthralgias and myalgias.  Skin:  Negative for rash.  Allergic/Immunologic: Negative for environmental allergies.  Neurological:  Negative for dizziness and headaches.  Psychiatric/Behavioral:  The patient is not nervous/anxious.          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   Avascular necrosis of bone of right hip ([place]) 01/14/2016   Avascular necrosis of hip ([place]) 01/14/2016   COPD (chronic obstructive pulmonary disease) ([place])     Coronary artery disease    Coronary disease 09/12/2015   Depression    General patient noncompliance 03/29/2013   History of MI (myocardial infarction) 09/12/2015   Hyperlipidemia    Hypertension    Malnutrition of moderate degree 03/28/2016   Myocardial infarction ([place])    20 yrs ago    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[place])    Postoperative anemia due to acute blood loss 03/27/2016   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([place])    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   Thyroid disease    reports that he has been low in the past but since he has stoped ETOH it has been normal    Social History   Socioeconomic History   Marital status: Legally Separated    Spouse name: Not on file   Number of children: Not on file   Years of education: Not on file   Highest education level: Not on file  Occupational History   Not on file  Tobacco Use   Smoking status: Former    Packs/day: 1.50    Years: 20.00    Total pack years: 30.00    Types: Cigarettes    Quit date: 11/25/2015    Years since quitting: 6.7   Smokeless tobacco: Never  Vaping Use   Vaping Use: Never used  Substance and Sexual Activity   Alcohol use: No    Alcohol/week: 0.0 standard drinks of alcohol    Comment: 4 months-recovering alcoholic per pt  Drug use: No   Sexual activity: Not on file  Other Topics Concern   Not on file  Social History Narrative   Separated.   Works as a Optician, dispensing.   Enjoys fishing, Proofreader, golfing.   Social Determinants of Health   Financial Resource Strain: Not on file  Food Insecurity: Not on file  Transportation Needs: Not on file  Physical Activity: Not on file  Stress: Not on file  Social Connections: Not on file  Intimate Partner Violence: Not on file    Past Surgical History:  Procedure Laterality Date   BIOPSY  12/05/2019   Procedure: BIOPSY;  Surgeon: Lavena Bullion, DO;  Location: [facility];  Service: Gastroenterology;;   CARDIAC CATHETERIZATION     many years  ago in 1990's   COLONOSCOPY  03/02/2019   COLONOSCOPY WITH PROPOFOL N/A 12/05/2019   Procedure: COLONOSCOPY WITH PROPOFOL;  Surgeon: Lavena Bullion, DO;  Location: [facility];  Service: Gastroenterology;  Laterality: N/A;   CORONARY STENT PLACEMENT     TONSILLECTOMY     as a child   TOTAL HIP ARTHROPLASTY Right 01/14/2016   Procedure: TOTAL HIP ARTHROPLASTY;  Surgeon: [name], MD;  Location: [facility];  Service: Orthopedics;  Laterality: Right;   TOTAL HIP ARTHROPLASTY Left 03/24/2016   Procedure: TOTAL HIP ARTHROPLASTY;  Surgeon: [name], MD;  Location: [facility];  Service: Orthopedics;  Laterality: Left;    Family History  Problem Relation Age of Onset   Cancer Mother    Dementia Father    Colon cancer Brother 74   Cancer Maternal Grandmother    Cancer Maternal Grandfather    Cancer Paternal Grandmother    Cancer Paternal Grandfather    Colon polyps Neg Hx    Esophageal cancer Neg Hx    Rectal cancer Neg Hx    Stomach cancer Neg Hx     Allergies  Allergen Reactions   Peanut-Containing Drug Products Anaphylaxis   Erythromycin Other (See Comments)    Unknown childhood allergy    Current Outpatient Medications on File Prior to Visit  Medication Sig Dispense Refill   aspirin EC 81 MG tablet Take 1 tablet (81 mg total) by mouth daily. 90 tablet 3   atorvastatin (LIPITOR) 80 MG tablet Take 1 tablet (80 mg total) by mouth daily. For cholesterol 90 tablet 3   buPROPion (WELLBUTRIN XL) 150 MG 24 hr tablet TAKE 1 TABLET BY MOUTH ONCE DAILY FOR ANXIETY AND FOR DEPRESSION . 90 tablet 3   carvedilol (COREG) 6.25 MG tablet Take 1 tablet (6.25 mg total) by mouth 2 (two) times daily with a meal. 180 tablet 3   escitalopram (LEXAPRO) 10 MG tablet TAKE 1 TABLET BY MOUTH ONCE DAILY FOR ANXIETY AND FOR DEPRESSION. OFFICE VISIT REQUIRED FOR FURTHER REFILLS. 30 tablet 0   ezetimibe (ZETIA) 10 MG tablet Take 1 tablet by mouth once daily 90 tablet 0   lisinopril (ZESTRIL) 20 MG tablet  Take 1 tablet by mouth once daily for blood pressure 90 tablet 0   Multiple Vitamin (MULTIVITAMIN WITH MINERALS) TABS tablet Take 1 tablet by mouth daily.     nitroGLYCERIN (NITROSTAT) 0.4 MG SL tablet Place 1 tablet (0.4 mg total) under the tongue every 5 (five) minutes as needed for chest pain. 100 tablet 1   Omega-3 Fatty Acids (FISH OIL) 1000 MG CAPS Take 1,000 mg by mouth daily.      VENTOLIN HFA 108 (90 Base) MCG/ACT inhaler Inhale 2 puffs into the lungs  every 4 (four) hours as needed for wheezing or shortness of breath. 18 g 3   No current facility-administered medications on file prior to visit.    BP 110/68   Pulse 70   Temp 98.6 F (37 C) (Oral)   Ht 6' (1.829 m)   Wt 181 lb (82.1 kg)   SpO2 97%   BMI 24.55 kg/m  Objective:   Physical Exam HENT:     Right Ear: Tympanic membrane and ear canal normal.     Left Ear: Tympanic membrane and ear canal normal.     Nose: Nose normal.     Right Sinus: No maxillary sinus tenderness or frontal sinus tenderness.     Left Sinus: No maxillary sinus tenderness or frontal sinus tenderness.  Eyes:     Conjunctiva/sclera: Conjunctivae normal.  Neck:     Thyroid: No thyromegaly.     Vascular: No carotid bruit.  Cardiovascular:     Rate and Rhythm: Normal rate and regular rhythm.     Heart sounds: Normal heart sounds.  Pulmonary:     Effort: Pulmonary effort is normal.     Breath sounds: Normal breath sounds. No wheezing or rales.  Abdominal:     General: Bowel sounds are normal.     Palpations: Abdomen is soft.     Tenderness: There is no abdominal tenderness.  Musculoskeletal:        General: Normal range of motion.     Cervical back: Neck supple.  Skin:    General: Skin is warm and dry.  Neurological:     Mental Status: He is alert and oriented to person, place, and time.     Cranial Nerves: No cranial nerve deficit.     Deep Tendon Reflexes: Reflexes are normal and symmetric.  Psychiatric:        Mood and Affect: Mood  normal.           Assessment & Plan:   Problem List Items Addressed This Visit       Cardiovascular and Mediastinum   Arteriosclerosis of coronary artery (Chronic)    Continue atorvastatin 80 mg daily, Zetia 10 mg daily. Repeat lipid panel pending.  LDL goal less than 70.      Benign essential HTN    Controlled.  Continue carvedilol 6.25 mg twice daily and lisinopril 20 mg daily. CMP pending.        Relevant Orders   Comprehensive metabolic panel     Respiratory   COPD (chronic obstructive pulmonary disease) (HCC)    Asymptomatic.  No regular use of Advair inhaler. Discussed to use albuterol inhaler as needed and to notify if he requires use of albuterol more than 3 times weekly.        Endocrine   Acquired hypothyroidism    Repeat TSH pending. TSH has been historically normal for years. Remain off of treatment.      Relevant Orders   TSH     Other   Anxiety and depression    Controlled.  Continue bupropion XL 150 mg daily, Lexapro 10 mg daily.      Preventative health care    Second Shingrix vaccine due, provided today.  Declines influenza vaccine. Colonoscopy due in December 2023, referral placed to GI. PSA due and pending. Referral placed for lung cancer screening.  Discussed the importance of a healthy diet and regular exercise in order for weight loss, and to reduce the risk of further co-morbidity.  Exam stable. Labs pending.  Follow  up in 1 year for repeat physical.       HLD (hyperlipidemia)    Continue atorvastatin 80 mg daily, Zetia 10 mg daily. LDL goal less than 70.  Repeat lipid panel pending.      Relevant Orders   Lipid panel   History of colonic polyps    Repeat colonoscopy due in December 2023. Referral placed to GI.      Other Visit Diagnoses     Screening for colon cancer    -  Primary   Relevant Orders   Ambulatory referral to Gastroenterology   Screening for lung cancer       Relevant Orders    Ambulatory Referral Lung Cancer Screening [facility]   Screening for prostate cancer       Relevant Orders   PSA          Pleas Koch, NP

## 2022-08-15 LAB — COMPREHENSIVE METABOLIC PANEL
AG Ratio: 1.8 (calc) (ref 1.0–2.5)
ALT: 19 U/L (ref 9–46)
AST: 22 U/L (ref 10–35)
Albumin: 4.1 g/dL (ref 3.6–5.1)
Alkaline phosphatase (APISO): 79 U/L (ref 35–144)
BUN: 16 mg/dL (ref 7–25)
CO2: 27 mmol/L (ref 20–32)
Calcium: 9.2 mg/dL (ref 8.6–10.3)
Chloride: 105 mmol/L (ref 98–110)
Creat: 1.19 mg/dL (ref 0.70–1.30)
Globulin: 2.3 g/dL (calc) (ref 1.9–3.7)
Glucose, Bld: 105 mg/dL — ABNORMAL HIGH (ref 65–99)
Potassium: 4 mmol/L (ref 3.5–5.3)
Sodium: 140 mmol/L (ref 135–146)
Total Bilirubin: 0.5 mg/dL (ref 0.2–1.2)
Total Protein: 6.4 g/dL (ref 6.1–8.1)

## 2022-08-15 LAB — TSH: TSH: 1.27 mIU/L (ref 0.40–4.50)

## 2022-08-15 LAB — PSA: PSA: 0.27 ng/mL (ref <=4.00)

## 2022-08-15 LAB — LIPID PANEL
Cholesterol: 162 mg/dL (ref <200)
HDL: 61 mg/dL (ref >=40)
LDL Cholesterol (Calc): 74 mg/dL (calc)
Non-HDL Cholesterol (Calc): 101 mg/dL (calc) (ref <130)
Total CHOL/HDL Ratio: 2.7 (calc) (ref <5.0)
Triglycerides: 168 mg/dL — ABNORMAL HIGH (ref <150)

## 2022-08-24 ENCOUNTER — Other Ambulatory Visit: Payer: Self-pay | Admitting: Primary Care

## 2022-08-24 DIAGNOSIS — F419 Anxiety disorder, unspecified: Secondary | ICD-10-CM

## 2022-12-24 ENCOUNTER — Other Ambulatory Visit: Payer: Self-pay | Admitting: Cardiovascular Disease

## 2022-12-24 DIAGNOSIS — I10 Essential (primary) hypertension: Secondary | ICD-10-CM

## 2022-12-31 ENCOUNTER — Emergency Department (HOSPITAL_BASED_OUTPATIENT_CLINIC_OR_DEPARTMENT_OTHER)
Admission: EM | Admit: 2022-12-31 | Discharge: 2022-12-31 | Disposition: A | Discharge status: Home / Self Care | Payer: Managed Care, Other (non HMO) | Attending: Emergency Medicine | Admitting: Emergency Medicine

## 2022-12-31 ENCOUNTER — Other Ambulatory Visit (HOSPITAL_BASED_OUTPATIENT_CLINIC_OR_DEPARTMENT_OTHER): Payer: Self-pay

## 2022-12-31 ENCOUNTER — Other Ambulatory Visit: Payer: Self-pay

## 2022-12-31 ENCOUNTER — Emergency Department (HOSPITAL_BASED_OUTPATIENT_CLINIC_OR_DEPARTMENT_OTHER): Payer: Managed Care, Other (non HMO)

## 2022-12-31 ENCOUNTER — Encounter (HOSPITAL_BASED_OUTPATIENT_CLINIC_OR_DEPARTMENT_OTHER): Payer: Self-pay | Admitting: Urology

## 2022-12-31 DIAGNOSIS — S5001XA Contusion of right elbow, initial encounter: Secondary | ICD-10-CM | POA: Diagnosis not present

## 2022-12-31 DIAGNOSIS — Z7982 Long term (current) use of aspirin: Secondary | ICD-10-CM | POA: Insufficient documentation

## 2022-12-31 DIAGNOSIS — Y9301 Activity, walking, marching and hiking: Secondary | ICD-10-CM | POA: Insufficient documentation

## 2022-12-31 DIAGNOSIS — S20211A Contusion of right front wall of thorax, initial encounter: Secondary | ICD-10-CM | POA: Diagnosis not present

## 2022-12-31 DIAGNOSIS — W19XXXA Unspecified fall, initial encounter: Secondary | ICD-10-CM

## 2022-12-31 DIAGNOSIS — Z91010 Allergy to peanuts: Secondary | ICD-10-CM | POA: Insufficient documentation

## 2022-12-31 DIAGNOSIS — W108XXA Fall (on) (from) other stairs and steps, initial encounter: Secondary | ICD-10-CM | POA: Insufficient documentation

## 2022-12-31 DIAGNOSIS — S59901A Unspecified injury of right elbow, initial encounter: Secondary | ICD-10-CM | POA: Diagnosis present

## 2022-12-31 MED ORDER — LIDOCAINE 5 % EX PTCH
1.0000 | MEDICATED_PATCH | CUTANEOUS | 0 refills | Status: DC
  Filled 2022-12-31 (×2): qty 30, 30d supply, fill #0

## 2022-12-31 MED ORDER — LIDOCAINE 5 % EX PTCH
1.0000 | MEDICATED_PATCH | CUTANEOUS | Status: DC
  Administered 2022-12-31: 1 via TRANSDERMAL
  Filled 2022-12-31: qty 1

## 2022-12-31 MED ORDER — OXYCODONE HCL 5 MG PO TABS
5.0000 mg | ORAL_TABLET | Freq: Four times a day (QID) | ORAL | 0 refills | Status: DC | PRN
  Filled 2022-12-31: qty 10, 3d supply, fill #0

## 2022-12-31 MED ORDER — CYCLOBENZAPRINE HCL 10 MG PO TABS
10.0000 mg | ORAL_TABLET | Freq: Two times a day (BID) | ORAL | 0 refills | Status: DC | PRN
  Filled 2022-12-31: qty 20, 10d supply, fill #0

## 2022-12-31 MED ORDER — ACETAMINOPHEN 325 MG PO TABS
650.0000 mg | ORAL_TABLET | Freq: Once | ORAL | Status: AC
  Administered 2022-12-31: 650 mg via ORAL
  Filled 2022-12-31: qty 2

## 2022-12-31 NOTE — ED Provider Notes (Signed)
[facility] EMERGENCY DEPARTMENT Provider Note   CSN: [id number] Arrival date & time: 12/31/22  1243     History  Chief Complaint  Patient presents with   Kurt [name]. is a 57 y.o. male.  Patient here with right elbow pain and right rib pain after fall.  Patient was walking with the dog downstairs when he tripped up and landed on the right side of his ribs on the steps.  Did not hit his head or lose consciousness.  He is on blood thinners.  He has pain to his right elbow and the right side of his chest.  Denies any loss of consciousness or neck pain or other extremity pain.  He is been ambulatory since the fall.  The history is provided by the patient.       Home Medications Prior to Admission medications   Medication Sig Start Date End Date Taking? Authorizing Provider  cyclobenzaprine (FLEXERIL) 10 MG tablet Take 1 tablet (10 mg total) by mouth 2 (two) times daily as needed for muscle spasms. 12/31/22  Yes Aubriel Khanna, DO  lidocaine (LIDODERM) 5 % Place 1 patch onto the skin daily. Remove & Discard patch within 12 hours or as directed by MD 12/31/22  Yes Yesena Reaves, DO  oxyCODONE (ROXICODONE) 5 MG immediate release tablet Take 1 tablet (5 mg total) by mouth every 6 (six) hours as needed for up to 10 doses for breakthrough pain. 12/31/22  Yes Rosetta Rupnow, DO  aspirin EC 81 MG tablet Take 1 tablet (81 mg total) by mouth daily. 01/05/20   Loel Dubonnet, NP  atorvastatin (LIPITOR) 80 MG tablet Take 1 tablet (80 mg total) by mouth daily. For cholesterol 05/12/22 05/07/23  Furth, Cadence H, PA-C  buPROPion (WELLBUTRIN XL) 150 MG 24 hr tablet TAKE 1 TABLET BY MOUTH ONCE DAILY FOR ANXIETY AND FOR DEPRESSION . 07/30/21   Pleas Koch, NP  carvedilol (COREG) 6.25 MG tablet Take 1 tablet (6.25 mg total) by mouth 2 (two) times daily with a meal. 01/13/21   Gollan, Kathlene November, MD  escitalopram (LEXAPRO) 10 MG tablet TAKE 1 TABLET BY MOUTH ONCE DAILY FOR ANXIETY  AND FOR DEPRESSION . 08/24/22   Pleas Koch, NP  ezetimibe (ZETIA) 10 MG tablet Take 1 tablet by mouth once daily 04/30/22   Minna Merritts, MD  lisinopril (ZESTRIL) 20 MG tablet TAKE 1 TABLET BY MOUTH ONCE DAILY FOR BLOOD PRESSURE. KEEP  FUTURE  APPOINTMENT  FOR  MORE  REFILLS 12/25/22   Minna Merritts, MD  Multiple Vitamin (MULTIVITAMIN WITH MINERALS) TABS tablet Take 1 tablet by mouth daily.    [provider]  nitroGLYCERIN (NITROSTAT) 0.4 MG SL tablet Place 1 tablet (0.4 mg total) under the tongue every 5 (five) minutes as needed for chest pain. 01/13/21 08/14/22  Minna Merritts, MD  Omega-3 Fatty Acids (FISH OIL) 1000 MG CAPS Take 1,000 mg by mouth daily.     [provider]  VENTOLIN HFA 108 (90 Base) MCG/ACT inhaler Inhale 2 puffs into the lungs every 4 (four) hours as needed for wheezing or shortness of breath. 08/08/21   Ria Bush, MD      Allergies    Peanut-containing drug products and Erythromycin    Review of Systems   Review of Systems  Physical Exam Updated Vital Signs BP (!) 158/95 (BP Location: Left Arm)   Pulse 68   Temp 98.5 F (36.9 C) (Oral)  Resp 18   Ht 6' (1.829 m)   Wt 83.9 kg   SpO2 100%   BMI 25.09 kg/m  Physical Exam Vitals and nursing note reviewed.  Constitutional:      General: He is not in acute distress.    Appearance: He is well-developed.  HENT:     Head: Normocephalic and atraumatic.  Eyes:     Extraocular Movements: Extraocular movements intact.     Conjunctiva/sclera: Conjunctivae normal.     Pupils: Pupils are equal, round, and reactive to light.  Cardiovascular:     Rate and Rhythm: Normal rate and regular rhythm.     Heart sounds: No murmur heard. Pulmonary:     Effort: Pulmonary effort is normal. No respiratory distress.     Breath sounds: Normal breath sounds.  Abdominal:     Palpations: Abdomen is soft.     Tenderness: There is no abdominal tenderness.  Musculoskeletal:        General:  Tenderness present. No swelling.     Cervical back: Normal range of motion and neck supple.     Comments: Tenderness to the right posterior ribs, tenderness to the right elbow but good range of motion without any obvious deformity or swelling, no midline spinal tenderness  Skin:    General: Skin is warm and dry.     Capillary Refill: Capillary refill takes less than 2 seconds.  Neurological:     General: No focal deficit present.     Mental Status: He is alert and oriented to person, place, and time.     Cranial Nerves: No cranial nerve deficit.     Sensory: No sensory deficit.     Motor: No weakness.     Coordination: Coordination normal.     Comments: 5+ out of 5 strength throughout, normal sensation, no drift, normal finger-nose-finger, no speech  Psychiatric:        Mood and Affect: Mood normal.     ED Results / Procedures / Treatments   Labs (all labs ordered are listed, but only abnormal results are displayed) Labs Reviewed - No data to display  EKG None  Radiology DG Thoracic Spine 2 View  Result Date: 12/31/2022 CLINICAL DATA:  Fall, back pain. EXAM: THORACIC SPINE 2 VIEWS COMPARISON:  None Available. FINDINGS: There is no evidence of thoracic spine fracture. Alignment is normal. 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 No other significant bone abnormalities are identified. IMPRESSION: No acute osseous injury of the thoracic spine. Electronically Signed   By: Keane Police D.O.   On: 12/31/2022 13:56   DG Ribs Unilateral W/Chest Right  Result Date: 12/31/2022 CLINICAL DATA:  Fall, right posterior rib pain.  Marked with BB. EXAM: RIGHT RIBS AND CHEST - 3+ VIEW COMPARISON:  None Available. FINDINGS: No fracture or other bone lesions are seen involving the ribs. There is no evidence of pneumothorax or pleural effusion. Both lungs are clear. Heart size and mediastinal contours are within normal limits.  IMPRESSION: Negative. Electronically Signed   By: Keane Police D.O.   On: 12/31/2022 13:55   DG Elbow Complete Right  Result Date: 12/31/2022 CLINICAL DATA:  Fall. Fell on back and right this morning. Right elbow pain. EXAM: RIGHT ELBOW - COMPLETE 3+ VIEW COMPARISON:  None Available. FINDINGS: There is no evidence of fracture, dislocation, or joint effusion. There is no evidence of arthropathy or other focal bone abnormality. Soft tissues are unremarkable. IMPRESSION: Negative. Electronically  Signed   By: Keane Police D.O.   On: 12/31/2022 13:54    Procedures Procedures    Medications Ordered in ED Medications  lidocaine (LIDODERM) 5 % 1 patch (1 patch Transdermal Patch Applied 12/31/22 1628)  acetaminophen (TYLENOL) tablet 650 mg (650 mg Oral Given 12/31/22 1628)    ED Course/ Medical Decision Making/ A&P                           Medical Decision Making Amount and/or Complexity of Data Reviewed Radiology: ordered.  Risk OTC drugs. Prescription drug management.   Kurt [name]. is here after mechanical fall in which he tripped and fell and landed on the right side of his ribs on his steps.  Pain to the right posterior ribs, pain to the right elbow.  No obvious deformity on exam.  Did not hit his head or lose consciousness.  He has no midline spinal pain.  He is neurologically intact.  He is not on blood thinners.  He is very well-appearing.  Differential diagnosis rib fractures versus rib contusion versus elbow contusion versus elbow fracture.  X-rays of the ribs, elbow, upper back were obtained and per my review and interpretation showed no acute findings.  Radiology report also with no acute findings.  Will prescribe lidocaine patch, Flexeril, Roxicodone and recommend Tylenol and ibuprofen for what I suspect rib contusions.  He is very well-appearing.  I have no concern for head or neck injury.  No concern for other acute injuries as he has no abdominal pain or abdominal tenderness.   Discharged in good condition.  Understands return precautions.  This chart was dictated using voice recognition software.  Despite best efforts to proofread,  errors can occur which can change the documentation meaning.         Final Clinical Impression(s) / ED Diagnoses Final diagnoses:  Fall, initial encounter  Contusion of right elbow, initial encounter  Contusion of rib on right side, initial encounter    Rx / DC Orders ED Discharge Orders          Ordered    oxyCODONE (ROXICODONE) 5 MG immediate release tablet  Every 6 hours PRN        12/31/22 1631    lidocaine (LIDODERM) 5 %  Every 24 hours        12/31/22 1631    cyclobenzaprine (FLEXERIL) 10 MG tablet  2 times daily PRN        12/31/22 1631              Lennice Sites, DO 12/31/22 1634

## 2022-12-31 NOTE — Discharge Instructions (Signed)
Overall suspect you have rib bruise, elbow bruise.  Recommend 800 mg ibuprofen every 8 hours.  1000 mg of Tylenol every 6 hours.  Lidocaine patches as prescribed.  Oxycodone is a narcotic pain medicine for breakthrough pain.  Do not mix with alcohol or drugs or other dangerous activities including driving as this medication is sedating.  Flexeril is a muscle relaxant and also should not be used while doing dangerous activities including driving as this medication is sedating.

## 2022-12-31 NOTE — ED Triage Notes (Signed)
Pt states fell onto back onto stairs this am  State thoracic back pain and right sided rib pain  Reports right elbow pain as well   Denies hitting head or LOC

## 2023-01-21 ENCOUNTER — Encounter: Payer: Self-pay | Admitting: Gastroenterology

## 2023-06-21 ENCOUNTER — Other Ambulatory Visit: Payer: Self-pay | Admitting: Cardiovascular Disease

## 2023-06-21 DIAGNOSIS — I10 Essential (primary) hypertension: Secondary | ICD-10-CM

## 2023-06-21 NOTE — Telephone Encounter (Signed)
Please schedule overdue F/U appointment for 90 day refills. Thank you! 

## 2023-06-22 ENCOUNTER — Telehealth: Payer: Self-pay | Admitting: Internal Medicine

## 2023-06-22 NOTE — Telephone Encounter (Signed)
Left voice mail to schedule appt

## 2023-06-24 NOTE — Telephone Encounter (Signed)
Left voicemail to schedule 12 mo follow up appt. 

## 2023-06-25 NOTE — Telephone Encounter (Signed)
Pt scheduled on 7/31

## 2023-07-28 ENCOUNTER — Ambulatory Visit: Payer: Managed Care, Other (non HMO) | Attending: Medical | Admitting: Medical

## 2023-07-28 ENCOUNTER — Encounter: Payer: Self-pay | Admitting: Medical

## 2023-07-28 VITALS — BP 148/80 | HR 59 | Ht 72.0 in | Wt 171.8 lb

## 2023-07-28 DIAGNOSIS — I420 Dilated cardiomyopathy: Secondary | ICD-10-CM | POA: Diagnosis not present

## 2023-07-28 DIAGNOSIS — I10 Essential (primary) hypertension: Secondary | ICD-10-CM | POA: Diagnosis not present

## 2023-07-28 DIAGNOSIS — E782 Mixed hyperlipidemia: Secondary | ICD-10-CM | POA: Diagnosis not present

## 2023-07-28 DIAGNOSIS — I2510 Atherosclerotic heart disease of native coronary artery without angina pectoris: Secondary | ICD-10-CM

## 2023-07-28 NOTE — Patient Instructions (Signed)
Medication Instructions:  Your physician recommends that you continue on your current medications as directed. Please refer to the Current Medication list given to you today.  *If you need a refill on your cardiac medications before your next appointment, please call your pharmacy*   Lab Work: None ordered today   Testing/Procedures: None ordered today   Follow-Up: At [facility], you and your health needs are our priority.  As part of our continuing mission to provide you with exceptional heart care, we have created designated Provider Care Teams.  These Care Teams include your primary Cardiologist (physician) and Advanced Practice Providers (APPs -  Physician Assistants and Nurse Practitioners) who all work together to provide you with the care you need, when you need it.  We recommend signing up for the patient portal called "MyChart".  Sign up information is provided on this After Visit Summary.  MyChart is used to connect with patients for Virtual Visits (Telemedicine).  Patients are able to view lab/test results, encounter notes, upcoming appointments, etc.  Non-urgent messages can be sent to your provider as well.   To learn more about what you can do with MyChart, go to [email or web address].    Your next appointment:   1 year(s)  Provider:   You may see Timothy Gollan, MD or one of the following Advanced Practice Providers on your designated Care Team:   Christopher Berge, NP Ryan Dunn, PA-C Cadence Furth, PA-C Sheri Hammock, NP     

## 2023-07-28 NOTE — Progress Notes (Signed)
Cardiology Office Note:    Date:  07/28/2023   ID:  Kurt Median., DOB 1966/04/01, MRN [id number]  PCP:  Doreene Nest, NP  [facility] Cardiologist:  Julien Nordmann, MD  [facility] Electrophysiologist:  None   Referring MD: Doreene Nest, NP   Chief Complaint: 1 year follow-up  History of Present Illness:    Kurt Newberg. is a 57 y.o. male with a hx of CAD (2 stents likely to LAD and mid to distal region in 2006), alcoholic cardiomyopathy, HTN, HLD, elevated LFTs, smoker who presents for 1 year follow-up.   Per previous documentation stress test with no significant ischemia, possible small fixed perfusion defect in apical region, low EF.  Confirmed by echo 10/2015 with EF 35 to 40%. Echo in 09/2020 showed LVEF 50-55%. He stopped drinking and smoking in 2021.   The patient was last seen 04/2022 and was doing well from a cardiac perspective.   Today, the patient is overall doing well. He denies chest pain, SOB, LLE, orthopnea, pnd, lightheadedness, or dizziness. He stays fairly active and goes to the gym 3 times a week.  PCP normally updates annual labs.  Blood pressure is mildly elevated, however he has not had his medications this morning.   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   Avascular necrosis of bone of right hip (HCC) 01/14/2016   Avascular necrosis of hip (HCC) 01/14/2016   COPD (chronic obstructive pulmonary disease) (HCC)    Coronary artery disease    Coronary disease 09/12/2015   Depression    General patient noncompliance 03/29/2013   History of MI (myocardial infarction) 09/12/2015   Hyperlipidemia    Hypertension    Malnutrition of moderate degree 03/28/2016   Myocardial infarction (HCC)    20 yrs ago    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HCC)    Postoperative anemia due to acute blood loss 03/27/2016    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(HCC)    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   Thyroid disease    reports that he has been low in the past but since he has stoped ETOH it has been normal    Past Surgical History:  Procedure Laterality Date   BIOPSY  12/05/2019   Procedure: BIOPSY;  Surgeon: Shellia Cleverly, DO;  Location: [facility];  Service: Gastroenterology;;   CARDIAC CATHETERIZATION     many years ago in 1990's   COLONOSCOPY  03/02/2019   COLONOSCOPY WITH PROPOFOL N/A 12/05/2019   Procedure: COLONOSCOPY WITH PROPOFOL;  Surgeon: Shellia Cleverly, DO;  Location: [facility];  Service: Gastroenterology;  Laterality: N/A;   CORONARY STENT PLACEMENT     TONSILLECTOMY     as a child   TOTAL HIP ARTHROPLASTY Right 01/14/2016   Procedure: TOTAL HIP ARTHROPLASTY;  Surgeon: Teryl Lucy, MD;  Location: [facility];  Service: Orthopedics;  Laterality: Right;   TOTAL HIP ARTHROPLASTY Left 03/24/2016   Procedure: TOTAL HIP ARTHROPLASTY;  Surgeon: Teryl Lucy, MD;  Location: [facility] OR;  Service: Orthopedics;  Laterality: Left;    Current Medications: Current Meds  Medication Sig   aspirin EC 81 MG tablet Take 1 tablet (81 mg total) by mouth daily.   buPROPion (WELLBUTRIN XL) 150 MG 24 hr tablet TAKE 1 TABLET BY MOUTH ONCE  DAILY FOR ANXIETY AND FOR DEPRESSION .   carvedilol (COREG) 6.25 MG tablet Take 1 tablet (6.25 mg total) by mouth 2 (two) times daily with a meal.   escitalopram (LEXAPRO) 10 MG tablet TAKE 1 TABLET BY MOUTH ONCE DAILY FOR ANXIETY AND FOR DEPRESSION .   ezetimibe (ZETIA) 10 MG tablet Take 1 tablet by mouth once daily   lisinopril (ZESTRIL) 20 MG tablet Take 1 tablet by mouth once daily for blood pressure   Multiple Vitamin (MULTIVITAMIN WITH MINERALS) TABS tablet Take 1 tablet by mouth daily.   Omega-3 Fatty Acids (FISH OIL) 1000 MG CAPS Take 1,000 mg by mouth daily.    VENTOLIN HFA 108 (90 Base) MCG/ACT inhaler Inhale 2 puffs into the lungs every 4 (four) hours as needed  for wheezing or shortness of breath.     Allergies:   Peanut-containing drug products and Erythromycin   Social History   Socioeconomic History   Marital status: Legally Separated    Spouse name: Not on file   Number of children: Not on file   Years of education: Not on file   Highest education level: Not on file  Occupational History   Not on file  Tobacco Use   Smoking status: Former    Current packs/day: 0.00    Average packs/day: 1.5 packs/day for 20.0 years (30.0 ttl pk-yrs)    Types: Cigarettes    Start date: 11/25/1995    Quit date: 11/25/2015    Years since quitting: 7.6   Smokeless tobacco: Never  Vaping Use   Vaping status: Never Used  Substance and Sexual Activity   Alcohol use: No    Alcohol/week: 0.0 standard drinks of alcohol    Comment: 4 months-recovering alcoholic per pt   Drug use: No   Sexual activity: Not on file  Other Topics Concern   Not on file  Social History Narrative   Separated.   Works as a Advertising account planner.   Enjoys fishing, Publishing copy, golfing.   Social Determinants of Health   Financial Resource Strain: Not on file  Food Insecurity: Not on file  Transportation Needs: Not on file  Physical Activity: Not on file  Stress: Not on file  Social Connections: Not on file     Family History: The patient's family history includes Cancer in his maternal grandfather, maternal grandmother, mother, paternal grandfather, and paternal grandmother; Colon cancer (age of onset: 42) in his brother; Dementia in his father. There is no history of Colon polyps, Esophageal cancer, Rectal cancer, or Stomach cancer.  ROS:   Please see the history of present illness.     All other systems reviewed and are negative.  EKGs/Labs/Other Studies Reviewed:    The following studies were reviewed today:  Echo 2021   1. Left ventricular ejection fraction, by estimation, is 50 to 55%. The  left ventricle has low normal function. The left ventricle has  no regional  wall motion abnormalities. There is mild left ventricular hypertrophy.  Left ventricular diastolic  parameters were normal.   2. Right ventricular systolic function is normal. The right ventricular  size is normal. Mildly increased right ventricular wall thickness.  Tricuspid regurgitation signal is inadequate for assessing PA pressure.   3. The mitral valve is normal in structure. No evidence of mitral valve  regurgitation. No evidence of mitral stenosis.   4. The aortic valve is tricuspid. Aortic valve regurgitation is not  visualized. No aortic stenosis is present.   5. The inferior vena cava  is normal in size with greater than 50%  respiratory variability, suggesting right atrial pressure of 3 mmHg.      Echo 2016 Study Conclusions   - Left ventricle: The cavity size was normal. There was mild    concentric hypertrophy. Systolic function was moderately reduced.    The estimated ejection fraction was in the range of 35% to 40%.    Diffuse hypokinesis. Severe hypokinesis of the apical myocardium.    Doppler parameters are consistent with abnormal left ventricular    relaxation (grade 1 diastolic dysfunction).  - Left atrium: The atrium was normal in size.  - Right ventricle: Systolic function was normal.  - Pulmonary arteries: Systolic pressure was within the normal    range.    Myoview Lexiscan 2016 Narrative & Impression  There was no ST segment deviation noted during stress.   Pharmacological  myocardial perfusion imaging study with no significant  Ischemia There is a small region of fixed defect in the apical region, seen at stress than rest. Region concerning for attenuation artifact though unable to exclude small region of old scar. Global hypokinesis with  EF estimated at 27% Baseline EKG with abnormal T wave V1 through V4, 1 and aVL No EKG changes concerning for ischemia with stress or in recovery . Low risk scan Consider echocardiogram to evaluate  ejection fraction if clinically indicated     EKG:  EKG is ordered today.  The ekg ordered today demonstrates NSR 59bpm, TWI aVL, IVCD, RAD  Recent Labs: 08/14/2022: ALT 19; BUN 16; Creat 1.19; Potassium 4.0; Sodium 140; TSH 1.27  Recent Lipid Panel    Component Value Date/Time   CHOL 162 08/14/2022 1438   TRIG 168 (H) 08/14/2022 1438   HDL 61 08/14/2022 1438   CHOLHDL 2.7 08/14/2022 1438   VLDL 21.4 09/11/2021 0745   LDLCALC 74 08/14/2022 1438     Physical Exam:    VS:  BP (!) 148/80 (BP Location: Left Arm, Patient Position: Sitting, Cuff Size: Normal)   Pulse (!) 59   Ht 6' (1.829 m)   Wt 171 lb 12.8 oz (77.9 kg)   SpO2 99%   BMI 23.30 kg/m     Wt Readings from Last 3 Encounters:  07/28/23 171 lb 12.8 oz (77.9 kg)  12/31/22 185 lb (83.9 kg)  08/14/22 181 lb (82.1 kg)     GEN:  Well nourished, well developed in no acute distress HEENT: Normal NECK: No JVD; No carotid bruits LYMPHATICS: No lymphadenopathy CARDIAC: RRR, no murmurs, rubs, gallops RESPIRATORY:  Clear to auscultation without rales, wheezing or rhonchi  ABDOMEN: Soft, non-tender, non-distended MUSCULOSKELETAL:  No edema; No deformity  SKIN: Warm and dry NEUROLOGIC:  Alert and oriented x 3 PSYCHIATRIC:  Normal affect   ASSESSMENT:    1. Dilated cardiomyopathy (HCC)   2. Coronary artery disease involving native coronary artery of native heart without angina pectoris   3. Benign essential HTN   4. Hyperlipidemia, mixed    PLAN:    In order of problems listed above:  Alcohol induced CM History of reduced EF down to 35 to 40%, improved to 50 to 55% by echo in 2021.  Patient stopped drinking alcohol in 2021 and has continued to be sober.  Patient is euvolemic on exam today.  Continue Coreg and lisinopril.  CAD with remote stenting Patient denies any anginal symptoms.  EKG today shows sinus rhythm with no ischemic changes.  Patient stays fairly active and goes to the gym 3 times  a week.  No further  ischemic workup indicated at this time.  Continue aspirin, statin and beta-blocker therapy.  Hypertension Blood pressure mildly elevated, however he has not taken his medications this morning.  Continue Coreg 6.25 mg twice daily and lisinopril 20 mg daily.  Hyperlipidemia LDL 72 in 2023.  Continue fish oil, Zetia, and Lipitor 80 mg daily.  Disposition: Follow up in 1 year(s) with MD    Signed, Ellee Wawrzyniak David Stall, PA-C  07/28/2023 8:20 AM    [facility]

## 2023-09-07 ENCOUNTER — Other Ambulatory Visit: Payer: Self-pay | Admitting: Cardiovascular Disease

## 2023-09-22 ENCOUNTER — Other Ambulatory Visit: Payer: Self-pay | Admitting: Cardiovascular Disease

## 2023-09-22 DIAGNOSIS — I10 Essential (primary) hypertension: Secondary | ICD-10-CM

## 2023-11-11 ENCOUNTER — Other Ambulatory Visit: Payer: Self-pay | Admitting: Primary Care

## 2023-11-11 DIAGNOSIS — F32A Depression, unspecified: Secondary | ICD-10-CM

## 2023-11-11 NOTE — Telephone Encounter (Signed)
Patient has been scheduled

## 2023-11-11 NOTE — Telephone Encounter (Signed)
Patient is due for CPE/follow up, this will be required prior to any further refills.  Please schedule, thank you!   

## 2023-11-16 ENCOUNTER — Ambulatory Visit (INDEPENDENT_AMBULATORY_CARE_PROVIDER_SITE_OTHER)
Admission: RE | Admit: 2023-11-16 | Discharge: 2023-11-16 | Disposition: A | Discharge status: Home / Self Care | Payer: 59 | Source: Ambulatory Visit | Attending: Primary Care | Admitting: Primary Care

## 2023-11-16 ENCOUNTER — Ambulatory Visit: Payer: Managed Care, Other (non HMO) | Admitting: Primary Care

## 2023-11-16 ENCOUNTER — Encounter: Payer: Self-pay | Admitting: Primary Care

## 2023-11-16 VITALS — BP 128/76 | HR 65 | Temp 98.2°F | Ht 72.0 in | Wt 188.0 lb

## 2023-11-16 DIAGNOSIS — I10 Essential (primary) hypertension: Secondary | ICD-10-CM

## 2023-11-16 DIAGNOSIS — Z1211 Encounter for screening for malignant neoplasm of colon: Secondary | ICD-10-CM

## 2023-11-16 DIAGNOSIS — M79643 Pain in unspecified hand: Secondary | ICD-10-CM | POA: Diagnosis not present

## 2023-11-16 DIAGNOSIS — Z122 Encounter for screening for malignant neoplasm of respiratory organs: Secondary | ICD-10-CM

## 2023-11-16 DIAGNOSIS — E785 Hyperlipidemia, unspecified: Secondary | ICD-10-CM

## 2023-11-16 DIAGNOSIS — F419 Anxiety disorder, unspecified: Secondary | ICD-10-CM

## 2023-11-16 DIAGNOSIS — I252 Old myocardial infarction: Secondary | ICD-10-CM

## 2023-11-16 DIAGNOSIS — G8929 Other chronic pain: Secondary | ICD-10-CM

## 2023-11-16 DIAGNOSIS — Z125 Encounter for screening for malignant neoplasm of prostate: Secondary | ICD-10-CM | POA: Diagnosis not present

## 2023-11-16 DIAGNOSIS — M25552 Pain in left hip: Secondary | ICD-10-CM

## 2023-11-16 DIAGNOSIS — K635 Polyp of colon: Secondary | ICD-10-CM

## 2023-11-16 DIAGNOSIS — Z0001 Encounter for general adult medical examination with abnormal findings: Secondary | ICD-10-CM | POA: Diagnosis not present

## 2023-11-16 DIAGNOSIS — E039 Hypothyroidism, unspecified: Secondary | ICD-10-CM

## 2023-11-16 DIAGNOSIS — I426 Alcoholic cardiomyopathy: Secondary | ICD-10-CM

## 2023-11-16 DIAGNOSIS — M25551 Pain in right hip: Secondary | ICD-10-CM

## 2023-11-16 DIAGNOSIS — M5441 Lumbago with sciatica, right side: Secondary | ICD-10-CM

## 2023-11-16 DIAGNOSIS — J449 Chronic obstructive pulmonary disease, unspecified: Secondary | ICD-10-CM

## 2023-11-16 DIAGNOSIS — F32A Depression, unspecified: Secondary | ICD-10-CM

## 2023-11-16 LAB — PSA: PSA: 0.35 ng/mL (ref 0.10–4.00)

## 2023-11-16 LAB — TSH: TSH: 1.28 u[IU]/mL (ref 0.35–5.50)

## 2023-11-16 LAB — LIPID PANEL
Cholesterol: 213 mg/dL — ABNORMAL HIGH (ref 0–200)
HDL: 49.2 mg/dL (ref >39.00)
LDL Cholesterol: 111 mg/dL — ABNORMAL HIGH (ref 0–99)
NonHDL: 163.42
Total CHOL/HDL Ratio: 4
Triglycerides: 262 mg/dL — ABNORMAL HIGH (ref 0.0–149.0)
VLDL: 52.4 mg/dL — ABNORMAL HIGH (ref 0.0–40.0)

## 2023-11-16 LAB — COMPREHENSIVE METABOLIC PANEL
ALT: 17 U/L (ref 0–53)
AST: 19 U/L (ref 0–37)
Albumin: 4.4 g/dL (ref 3.5–5.2)
Alkaline Phosphatase: 72 U/L (ref 39–117)
BUN: 16 mg/dL (ref 6–23)
CO2: 29 meq/L (ref 19–32)
Calcium: 9.4 mg/dL (ref 8.4–10.5)
Chloride: 101 meq/L (ref 96–112)
Creatinine, Ser: 1.03 mg/dL (ref 0.40–1.50)
GFR: 79.75 mL/min (ref >60.00)
Glucose, Bld: 80 mg/dL (ref 70–99)
Potassium: 4 meq/L (ref 3.5–5.1)
Sodium: 138 meq/L (ref 135–145)
Total Bilirubin: 0.5 mg/dL (ref 0.2–1.2)
Total Protein: 6.9 g/dL (ref 6.0–8.3)

## 2023-11-16 LAB — C-REACTIVE PROTEIN: CRP: <1 mg/dL (ref 0.5–20.0)

## 2023-11-16 LAB — HEMOGLOBIN A1C: Hgb A1c MFr Bld: 5.8 % (ref 4.6–6.5)

## 2023-11-16 LAB — SEDIMENTATION RATE: Sed Rate: 17 mm/h (ref 0–20)

## 2023-11-16 NOTE — Assessment & Plan Note (Signed)
Asymptomatic. Follows with cardiology.  Continue carvedilol 6.25 mg twice daily, blood pressure control, lipid control, aspirin 81 mg daily.

## 2023-11-16 NOTE — Assessment & Plan Note (Signed)
Overdue for colonoscopy, referral placed to GI.

## 2023-11-16 NOTE — Assessment & Plan Note (Signed)
Repeat lipid panel pending.  Continue atorvastatin 80 mg daily, Zetia 10 mg daily.

## 2023-11-16 NOTE — Assessment & Plan Note (Signed)
Controlled.  Continue bupropion XL 150 mg daily, Lexapro 10 mg daily.

## 2023-11-16 NOTE — Patient Instructions (Signed)
Stop by the lab and xray prior to leaving today. I will notify you of your results once received.   You will receive a phone call to schedule your colonoscopy.  You will receive a phone call regarding the lung cancer screening program.  It was a pleasure to see you today!

## 2023-11-16 NOTE — Addendum Note (Signed)
Addended by: Doreene Nest on: 11/16/2023 08:25 AM   Modules accepted: Level of Service

## 2023-11-16 NOTE — Assessment & Plan Note (Signed)
Controlled.  Continue lisinopril 20 mg daily, carvedilol 6.25 mg twice daily. CMP pending.

## 2023-11-16 NOTE — Assessment & Plan Note (Signed)
Asymptomatic.  Continue albuterol inhaler as needed.  Referral placed for lung cancer screening program.

## 2023-11-16 NOTE — Assessment & Plan Note (Signed)
Immunizations UTD.  Declines influenza vaccine Colonoscopy overdue, referral placed to GI. PSA due and pending.  Discussed the importance of a healthy diet and regular exercise in order for weight loss, and to reduce the risk of further co-morbidity.  Exam stable. Labs pending.  Follow up in 1 year for repeat physical.

## 2023-11-16 NOTE — Assessment & Plan Note (Signed)
Historically normal for years. Remain off treatment.  Repeat TSH pending.

## 2023-11-16 NOTE — Assessment & Plan Note (Signed)
Stable.  Remain sober.  Follow with cardiology, office notes reviewed from July 2024.  Continue lisinopril 20 mg daily, carvedilol 6.25 mg twice daily, aspirin 81 mg daily.

## 2023-11-16 NOTE — Progress Notes (Signed)
Subjective:    Patient ID: Kurt Median., male    DOB: 09-19-1966, 57 y.o.   MRN: [id number]  HPI  Kurt [name]. is a very pleasant 57 y.o. male who presents today for complete physical and follow up of chronic conditions.  He would also like to discuss chronic joint pain.  History of avascular necrosis with bilateral hip replacements.  Chronic bilateral lower back pain to the mid lumbar spine, chronic bilateral knee pain, bilateral hip pain, sometimes bilateral groin pain. Intermittent right lower extremity sciatica pain, bilateral hand pain. Also with nodules to bilateral hands, right 4th digit at PIP and left 5th digit at the PIP joint. He's finding it hard to grip objects with his hands due to pain. Decreased strength with opening jars. He will take Ibuprofen or Tylenol on occasion for pain. 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 He's been exercising at the gym, no recent exercise, no improvement in symptoms.  He denies loss of bowel/bladder control.  Immunizations: -Tetanus: Completed in 2022 -Influenza: Declines -Shingles: Completed Shingrix series -Pneumonia: Completed last in 2009  Diet: Fair diet.  Exercise: No regular exercise.  Eye exam: Completes annually  Dental exam: Completes semi-annually    Colonoscopy: Completed in 2020, due December 2023 and has yet to complete Lung Cancer Screening: Completed last in 2017,referred last year and did not complete in 2023  PSA: Due  BP Readings from Last 3 Encounters:  11/16/23 128/76  07/28/23 (!) 148/80  12/31/22 (!) 158/95         Review of Systems  Constitutional:  Negative for unexpected weight change.  HENT:  Negative for rhinorrhea.   Respiratory:  Negative for cough and shortness of breath.   Cardiovascular:  Negative for chest pain.  Gastrointestinal:  Negative for constipation and diarrhea.  Genitourinary:   Negative for difficulty urinating.  Musculoskeletal:  Positive for arthralgias.  Skin:  Negative for rash.  Allergic/Immunologic: Negative for environmental allergies.  Neurological:  Positive for weakness and numbness. Negative for dizziness and headaches.  Psychiatric/Behavioral:  The patient is not nervous/anxious.          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   Avascular necrosis of bone of right hip (HCC) 01/14/2016   Avascular necrosis of hip (HCC) 01/14/2016   Bowel habit changes 03/12/2020   COPD (chronic obstructive pulmonary disease) (HCC)    Coronary artery disease    Coronary disease 09/12/2015   Depression    General patient noncompliance 03/29/2013   History of MI (myocardial infarction) 09/12/2015   Hyperlipidemia    Hypertension    Internal hemorrhoids    Malnutrition of moderate degree 03/28/2016   Myocardial infarction (HCC)    20 yrs ago    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HCC)    Postoperative anemia due to acute blood loss 03/27/2016   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(HCC)    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   Thyroid disease    reports that he has been low in the past but since he has stoped ETOH it has been normal    Social History   Socioeconomic History   Marital status: Legally Separated    Spouse name: Not  on file   Number of children: Not on file   Years of education: Not on file   Highest education level: Not on file  Occupational History   Not on file  Tobacco Use   Smoking status: Former    Current packs/day: 0.00    Average packs/day: 1.5 packs/day for 20.0 years (30.0 ttl pk-yrs)    Types: Cigarettes    Start date: 11/25/1995    Quit date: 11/25/2015    Years since quitting: 7.9   Smokeless tobacco: Never  Vaping Use   Vaping status: Never Used  Substance and Sexual Activity   Alcohol  use: No    Alcohol/week: 0.0 standard drinks of alcohol    Comment: 4 months-recovering alcoholic per pt   Drug use: No   Sexual activity: Not on file  Other Topics Concern   Not on file  Social History Narrative   Separated.   Works as a Advertising account planner.   Enjoys fishing, Publishing copy, golfing.   Social Determinants of Health   Financial Resource Strain: Not on file  Food Insecurity: Not on file  Transportation Needs: Not on file  Physical Activity: Not on file  Stress: Not on file  Social Connections: Not on file  Intimate Partner Violence: Not on file    Past Surgical History:  Procedure Laterality Date   BIOPSY  12/05/2019   Procedure: BIOPSY;  Surgeon: Shellia Cleverly, DO;  Location: [facility];  Service: Gastroenterology;;   CARDIAC CATHETERIZATION     many years ago in 1990's   COLONOSCOPY  03/02/2019   COLONOSCOPY WITH PROPOFOL N/A 12/05/2019   Procedure: COLONOSCOPY WITH PROPOFOL;  Surgeon: Shellia Cleverly, DO;  Location: [facility];  Service: Gastroenterology;  Laterality: N/A;   CORONARY STENT PLACEMENT     TONSILLECTOMY     as a child   TOTAL HIP ARTHROPLASTY Right 01/14/2016   Procedure: TOTAL HIP ARTHROPLASTY;  Surgeon: Teryl Lucy, MD;  Location: [facility];  Service: Orthopedics;  Laterality: Right;   TOTAL HIP ARTHROPLASTY Left 03/24/2016   Procedure: TOTAL HIP ARTHROPLASTY;  Surgeon: Teryl Lucy, MD;  Location: [facility];  Service: Orthopedics;  Laterality: Left;    Family History  Problem Relation Age of Onset   Cancer Mother    Dementia Father    Colon cancer Brother 27   Cancer Maternal Grandmother    Cancer Maternal Grandfather    Cancer Paternal Grandmother    Cancer Paternal Grandfather    Colon polyps Neg Hx    Esophageal cancer Neg Hx    Rectal cancer Neg Hx    Stomach cancer Neg Hx     Allergies  Allergen Reactions   Peanut-Containing Drug Products Anaphylaxis   Erythromycin Other (See Comments)    Unknown childhood allergy     Current Outpatient Medications on File Prior to Visit  Medication Sig Dispense Refill   aspirin EC 81 MG tablet Take 1 tablet (81 mg total) by mouth daily. 90 tablet 3   buPROPion (WELLBUTRIN XL) 150 MG 24 hr tablet TAKE 1 TABLET BY MOUTH ONCE DAILY FOR ANXIETY AND FOR DEPRESSION . 90 tablet 3   carvedilol (COREG) 6.25 MG tablet Take 1 tablet (6.25 mg total) by mouth 2 (two) times daily with a meal. 180 tablet 3   escitalopram (LEXAPRO) 10 MG tablet TAKE 1 TABLET BY MOUTH ONCE DAILY FOR ANXIETY AND FOR DEPRESSION 30 tablet 0   ezetimibe (ZETIA) 10 MG tablet TAKE 1 TABLET BY MOUTH ONCE DAILY FOR  CHOLESTEROL. KEEP FUTURE APPOINTMENT FOR MORE REFILLS. 90 tablet 2   lisinopril (ZESTRIL) 20 MG tablet Take 1 tablet by mouth once daily for blood pressure 90 tablet 3   Multiple Vitamin (MULTIVITAMIN WITH MINERALS) TABS tablet Take 1 tablet by mouth daily.     Omega-3 Fatty Acids (FISH OIL) 1000 MG CAPS Take 1,000 mg by mouth daily.      VENTOLIN HFA 108 (90 Base) MCG/ACT inhaler Inhale 2 puffs into the lungs every 4 (four) hours as needed for wheezing or shortness of breath. 18 g 3   atorvastatin (LIPITOR) 80 MG tablet Take 1 tablet (80 mg total) by mouth daily. For cholesterol 90 tablet 3   nitroGLYCERIN (NITROSTAT) 0.4 MG SL tablet Place 1 tablet (0.4 mg total) under the tongue every 5 (five) minutes as needed for chest pain. 100 tablet 1   No current facility-administered medications on file prior to visit.    BP 128/76   Pulse 65   Temp 98.2 F (36.8 C) (Oral)   Ht 6' (1.829 m)   Wt 188 lb (85.3 kg)   SpO2 97%   BMI 25.50 kg/m  Objective:   Physical Exam HENT:     Right Ear: Tympanic membrane and ear canal normal.     Left Ear: Tympanic membrane and ear canal normal.  Eyes:     Pupils: Pupils are equal, round, and reactive to light.  Cardiovascular:     Rate and Rhythm: Normal rate and regular rhythm.  Pulmonary:     Effort: Pulmonary effort is normal.     Breath sounds:  Normal breath sounds.  Abdominal:     General: Bowel sounds are normal.     Palpations: Abdomen is soft.     Tenderness: There is no abdominal tenderness.  Musculoskeletal:        General: Normal range of motion.     Cervical back: Neck supple.     Thoracic back: No bony tenderness. Normal range of motion.     Lumbar back: No bony tenderness. Normal range of motion. Negative right straight leg raise test and negative left straight leg raise test.       Back:  Skin:    General: Skin is warm and dry.  Neurological:     Mental Status: He is alert and oriented to person, place, and time.     Cranial Nerves: No cranial nerve deficit.     Deep Tendon Reflexes:     Reflex Scores:      Patellar reflexes are 2+ on the right side and 2+ on the left side. Psychiatric:        Mood and Affect: Mood normal.           Assessment & Plan:  Encounter for annual general medical examination with abnormal findings in adult Assessment & Plan: Immunizations UTD.  Declines influenza vaccine Colonoscopy overdue, referral placed to GI. PSA due and pending.  Discussed the importance of a healthy diet and regular exercise in order for weight loss, and to reduce the risk of further co-morbidity.  Exam stable. Labs pending.  Follow up in 1 year for repeat physical.    Screening for colon cancer -     Ambulatory referral to Gastroenterology  Screening for lung cancer -     Ambulatory Referral for Lung Cancer Scre  Hyperlipidemia, unspecified hyperlipidemia type Assessment & Plan: Repeat lipid panel pending.  Continue atorvastatin 80 mg daily, Zetia 10 mg daily.  Orders: -  Comprehensive metabolic panel -     Lipid panel -     Hemoglobin A1c  Acquired hypothyroidism Assessment & Plan: Historically normal for years. Remain off treatment.  Repeat TSH pending.  Orders: -     TSH  Benign essential HTN Assessment & Plan: Controlled.  Continue lisinopril 20 mg daily,  carvedilol 6.25 mg twice daily. CMP pending.   Chronic midline low back pain with right-sided sciatica -     DG Lumbar Spine 2-3 Views  Chronic pain of both hips -     DG HIP UNILAT W OR W/O PELVIS 2-3 VIEWS RIGHT -     DG HIP UNILAT W OR W/O PELVIS 2-3 VIEWS LEFT  Chronic hand pain, unspecified laterality -     C-reactive protein -     Cyclic citrul peptide antibody, IgG -     Rheumatoid factor -     Sedimentation rate  Screening for prostate cancer -     PSA  Alcoholic cardiomyopathy (HCC) Assessment & Plan: Stable.  Remain sober.  Follow with cardiology, office notes reviewed from July 2024.  Continue lisinopril 20 mg daily, carvedilol 6.25 mg twice daily, aspirin 81 mg daily.   Chronic obstructive pulmonary disease, unspecified COPD type (HCC) Assessment & Plan: Asymptomatic.  Continue albuterol inhaler as needed.  Referral placed for lung cancer screening program.   Polyp of sigmoid colon, unspecified type Assessment & Plan: Overdue for colonoscopy, referral placed to GI.   Anxiety and depression Assessment & Plan: Controlled.  Continue bupropion XL 150 mg daily, Lexapro 10 mg daily.   History of MI (myocardial infarction) Assessment & Plan: Asymptomatic. Follows with cardiology.  Continue carvedilol 6.25 mg twice daily, blood pressure control, lipid control, aspirin 81 mg daily.         Doreene Nest, NP

## 2023-11-17 LAB — RHEUMATOID FACTOR: Rheumatoid fact SerPl-aCnc: <10 [IU]/mL (ref <14)

## 2023-11-17 LAB — CYCLIC CITRUL PEPTIDE ANTIBODY, IGG: Cyclic Citrullin Peptide Ab: 25 U — ABNORMAL HIGH

## 2023-11-18 ENCOUNTER — Other Ambulatory Visit: Payer: Self-pay | Admitting: Primary Care

## 2023-11-18 DIAGNOSIS — G8929 Other chronic pain: Secondary | ICD-10-CM

## 2023-12-01 ENCOUNTER — Other Ambulatory Visit: Payer: Self-pay | Admitting: Primary Care

## 2023-12-01 DIAGNOSIS — G8929 Other chronic pain: Secondary | ICD-10-CM

## 2023-12-06 ENCOUNTER — Other Ambulatory Visit: Payer: Self-pay | Admitting: Primary Care

## 2023-12-06 DIAGNOSIS — F32A Depression, unspecified: Secondary | ICD-10-CM

## 2023-12-07 ENCOUNTER — Encounter: Payer: Self-pay | Admitting: Gastroenterology

## 2023-12-09 ENCOUNTER — Telehealth: Payer: Self-pay | Admitting: Primary Care

## 2023-12-09 ENCOUNTER — Other Ambulatory Visit: Payer: Self-pay | Admitting: Primary Care

## 2023-12-09 DIAGNOSIS — F419 Anxiety disorder, unspecified: Secondary | ICD-10-CM

## 2023-12-09 MED ORDER — ESCITALOPRAM OXALATE 10 MG PO TABS: 10.0000 mg | ORAL_TABLET | Freq: Every day | ORAL | 2 refills | Status: DC

## 2023-12-09 NOTE — Telephone Encounter (Signed)
[place] from [facility] called over and stated that the escitalopram (LEXAPRO) 10 MG tablet didn't have any instructions on how to take the medication. He also stated that they are needing a new prescription for buPROPion (WELLBUTRIN XL) 150 MG 24 hr tablet to be sent over. Thank you!

## 2023-12-09 NOTE — Addendum Note (Signed)
Addended by: Doreene Nest on: 12/09/2023 10:15 PM   Modules accepted: Orders

## 2023-12-09 NOTE — Telephone Encounter (Signed)
That's odd. Rx fixed. Refill(s) sent to pharmacy.

## 2023-12-30 ENCOUNTER — Ambulatory Visit (INDEPENDENT_AMBULATORY_CARE_PROVIDER_SITE_OTHER): Payer: Managed Care, Other (non HMO) | Admitting: Physician Assistant

## 2023-12-30 ENCOUNTER — Encounter: Payer: Self-pay | Admitting: Physician Assistant

## 2023-12-30 DIAGNOSIS — Z96641 Presence of right artificial hip joint: Secondary | ICD-10-CM | POA: Diagnosis not present

## 2023-12-30 DIAGNOSIS — M545 Low back pain, unspecified: Secondary | ICD-10-CM

## 2023-12-30 DIAGNOSIS — Z96642 Presence of left artificial hip joint: Secondary | ICD-10-CM | POA: Diagnosis not present

## 2023-12-30 NOTE — Progress Notes (Signed)
 Office Visit Note   Patient: Kurt [name].           Date of Birth: Feb 04, 1966           MRN: [id number] Visit Date: 12/30/2023              Requested by: Kurt Comer POUR, NP [address] [place],  [place] [address] PCP: Kurt Comer POUR, NP   Assessment & Plan: Visit Diagnoses:  1. Lumbar pain   2. Hx of total hip arthroplasty, left   3. Hx of total hip arthroplasty, right     Plan: Will send him for therapy on his lumbar spine.  They will work on core strengthening, home exercise program, stretching and include modalities.  Follow-Up Instructions: Return in about 6 weeks (around 02/10/2024).   Orders:  Orders Placed This Encounter  Procedures   Ambulatory referral to Physical Therapy   No orders of the defined types were placed in this encounter.     Procedures: No procedures performed   Clinical Data: No additional findings.   Subjective: Chief Complaint  Patient presents with   Lower Back - Pain   Right Hip - Pain    HPI Kurt [name] is a 58 year old male were seen today for multiple joint pain.  He does have a scheduled appointment with rheumatology in April or May.  Family history of rheumatoid arthritis.  He is mainly complaining of bilateral hip pain back pain and knee pain that occurs occasionally.  He also has constant hand pain.  He states all this began after working out.  He worked out for about 6 to 8 months and then had to stop due to the pain that he was having particularly in his back.  At that time he was having radicular symptoms down his right leg and this was in November.  This is resolved he had no treatment for it.  Has a history of bilateral hip replacements performed by another orthopedic surgeon here in town and 2017.  He is taking Tylenol  ibuprofen turmeric and Boswellia for his aches and pains. Radiographs dated 11/16/2023 AP pelvis and lateral views of bilateral hips.  Shows bilateral hip replacements.  No hardware failure.   No evidence of loosening.  No acute fractures or acute findings. Lumbar spine AP and lateral views were personally reviewed and showed mild to moderate facet arthritic changes S1 and S2 and L5-S1.  No acute fractures no anterior spondylolisthesis.  No acute findings.  Review of Systems See HPI otherwise negative  Objective: Vital Signs: There were no vitals taken for this visit.  Physical Exam General: Well-developed well-nourished male no acute distress mood and affect appropriate Psych: Alert and oriented x 3 Respirations unlabored Vascular: Calf supple nontender dorsiflexion pulses 2+ equal symmetric Ortho Exam Lumbar spine: Has full flexion extension lumbar spine.  No tenderness over the lumbar spinal column paraspinous region.  Negative straight leg raise.  5 out of 5 strength throughout resistance. Bilateral hips excellent range of motion of both hips without pain.  Nontender over the trochanteric region bilateral hips. Bilateral hands Bouchard nodules right ring finger and left fifth finger.  Dupuytren's contracture involving the right hand.  Specialty Comments:  No specialty comments available.  Imaging: No results found.   PMFS History: Patient Active Problem List   Diagnosis Date Noted   Fatigue 03/12/2020   History of colonic polyps    Polyp of sigmoid colon    Elevated LFTs 10/04/2017  COPD (chronic obstructive pulmonary disease) (HCC) 10/04/2017   Nocturia 10/04/2017   Alcoholic cardiomyopathy (HCC) 87/90/7983   Unstable angina (HCC) 11/25/2015   Arteriosclerosis of coronary artery 10/20/2015   HLD (hyperlipidemia) 10/20/2015   Alcoholism (HCC) 10/20/2015   History of MI (myocardial infarction) 09/12/2015   Anxiety and depression 07/31/2015   Testosterone  deficiency 07/31/2015   Encounter for annual general medical examination with abnormal findings in adult 07/31/2015   Benign essential HTN 06/28/2014   Acquired hypothyroidism 04/06/2013   Past Medical  History:  Diagnosis Date   Allergy    mild   Anginal pain (HCC)    Anxiety    Arthritis    maybe in hands    Avascular necrosis of bone of left hip (HCC) 03/24/2016   Avascular necrosis of bone of right hip (HCC) 01/14/2016   Avascular necrosis of hip (HCC) 01/14/2016   Bowel habit changes 03/12/2020   COPD (chronic obstructive pulmonary disease) (HCC)    Coronary artery disease    Coronary disease 09/12/2015   Depression    General patient noncompliance 03/29/2013   History of MI (myocardial infarction) 09/12/2015   Hyperlipidemia    Hypertension    Internal hemorrhoids    Malnutrition of moderate degree 03/28/2016   Myocardial infarction (HCC)    20 yrs ago    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HCC)    Postoperative anemia due to acute blood loss 03/27/2016   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(HCC)    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   Thyroid  disease    reports that he has been low in the past but since he has stoped ETOH it has been normal    Family History  Problem Relation Age of Onset   Cancer Mother    Dementia Father    Colon cancer Brother 63   Cancer Maternal Grandmother    Cancer Maternal Grandfather    Cancer Paternal Grandmother    Cancer Paternal Grandfather    Colon polyps Neg Hx    Esophageal cancer Neg Hx    Rectal cancer Neg Hx    Stomach cancer Neg Hx     Past Surgical History:  Procedure Laterality Date   BIOPSY  12/05/2019   Procedure: BIOPSY;  Surgeon: San Sandor GAILS, DO;  Location: [facility];  Service: Gastroenterology;;   CARDIAC CATHETERIZATION     many years ago in 1990's   COLONOSCOPY  03/02/2019   COLONOSCOPY WITH PROPOFOL  N/A 12/05/2019   Procedure: COLONOSCOPY WITH PROPOFOL ;  Surgeon: San Sandor GAILS, DO;  Location: [facility];  Service: Gastroenterology;  Laterality: N/A;   CORONARY STENT PLACEMENT     TONSILLECTOMY     as a child   TOTAL HIP ARTHROPLASTY Right 01/14/2016   Procedure: TOTAL HIP ARTHROPLASTY;  Surgeon: Fonda Olmsted, MD;  Location: [facility];  Service: Orthopedics;  Laterality: Right;   TOTAL HIP ARTHROPLASTY Left 03/24/2016   Procedure: TOTAL HIP ARTHROPLASTY;  Surgeon: Fonda Olmsted, MD;  Location: [facility];  Service: Orthopedics;  Laterality: Left;   Social History   Occupational History   Not on file  Tobacco Use   Smoking status: Former    Current packs/day: 0.00    Average packs/day: 1.5 packs/day for 20.0 years (30.0 ttl pk-yrs)    Types: Cigarettes    Start date: 11/25/1995    Quit date: 11/25/2015    Years since quitting: 8.1   Smokeless tobacco: Never  Vaping Use   Vaping status: Never Used  Substance and Sexual Activity  Alcohol use: No    Alcohol/week: 0.0 standard drinks of alcohol    Comment: 4 months-recovering alcoholic per pt   Drug use: No   Sexual activity: Not on file

## 2024-01-05 NOTE — Therapy (Signed)
 OUTPATIENT PHYSICAL THERAPY EVALUATION   Patient Name: Kurt [name]. MRN: [id number] DOB:1966/11/05, 58 y.o., male Today's Date: 01/06/2024  END OF SESSION:  PT End of Session - 01/06/24 1449     Visit Number 1    Number of Visits 20    Date for PT Re-Evaluation 03/16/24    Authorization Type [employer]    Progress Note Due on Visit 10    PT Start Time 1517    PT Stop Time 1545    PT Time Calculation (min) 28 min    Behavior During Therapy WFL for tasks assessed/performed             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   Avascular necrosis of bone of right hip (HCC) 01/14/2016   Avascular necrosis of hip (HCC) 01/14/2016   Bowel habit changes 03/12/2020   COPD (chronic obstructive pulmonary disease) (HCC)    Coronary artery disease    Coronary disease 09/12/2015   Depression    General patient noncompliance 03/29/2013   History of MI (myocardial infarction) 09/12/2015   Hyperlipidemia    Hypertension    Internal hemorrhoids    Malnutrition of moderate degree 03/28/2016   Myocardial infarction (HCC)    20 yrs ago    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HCC)    Postoperative anemia due to acute blood loss 03/27/2016   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(HCC)    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   Thyroid  disease    reports that he has been low in the past but since he has stoped ETOH it has been normal   Past Surgical History:  Procedure Laterality Date   BIOPSY  12/05/2019   Procedure: BIOPSY;  Surgeon: San Sandor GAILS, DO;  Location: [facility];  Service: Gastroenterology;;   CARDIAC CATHETERIZATION     many years ago in 1990's   COLONOSCOPY  03/02/2019   COLONOSCOPY WITH PROPOFOL  N/A 12/05/2019   Procedure: COLONOSCOPY WITH PROPOFOL ;  Surgeon: San Sandor GAILS, DO;  Location: [facility];  Service:  Gastroenterology;  Laterality: N/A;   CORONARY STENT PLACEMENT     TONSILLECTOMY     as a child   TOTAL HIP ARTHROPLASTY Right 01/14/2016   Procedure: TOTAL HIP ARTHROPLASTY;  Surgeon: Fonda Olmsted, MD;  Location: [facility];  Service: Orthopedics;  Laterality: Right;   TOTAL HIP ARTHROPLASTY Left 03/24/2016   Procedure: TOTAL HIP ARTHROPLASTY;  Surgeon: Fonda Olmsted, MD;  Location: [facility];  Service: Orthopedics;  Laterality: Left;   Patient Active Problem List   Diagnosis Date Noted   Fatigue 03/12/2020   History of colonic polyps    Polyp of sigmoid colon    Elevated LFTs 10/04/2017   COPD (chronic obstructive pulmonary disease) (HCC) 10/04/2017   Nocturia 10/04/2017   Alcoholic cardiomyopathy (HCC) 87/90/7983   Unstable angina (HCC) 11/25/2015   Arteriosclerosis of coronary artery 10/20/2015   HLD (hyperlipidemia) 10/20/2015   Alcoholism (HCC) 10/20/2015   History of MI (myocardial infarction) 09/12/2015   Anxiety and depression 07/31/2015   Testosterone  deficiency 07/31/2015   Encounter for annual general medical examination with abnormal findings in adult 07/31/2015   Benign essential HTN 06/28/2014   Acquired hypothyroidism 04/06/2013    PCP: Gretta Comer BEAL NP  REFERRING PROVIDER: Gretta Bertrum ORN, PA-C  REFERRING  DIAG: M54.50 (ICD-10-CM) - Lumbar pain Z96.642 (ICD-10-CM) - Hx of total hip arthroplasty, left Z96.641 (ICD-10-CM) - Hx of total hip arthroplasty, right  Rationale for Evaluation and Treatment: Rehabilitation  THERAPY DIAG:  Other low back pain  Pain in left hip  Pain in right hip  Muscle weakness (generalized)  Difficulty in walking, not elsewhere classified  ONSET DATE: Late summer 2024  SUBJECTIVE:                                                                                                                                                                                           SUBJECTIVE STATEMENT: Pt indicated biggest problem is lower  back with insidious onset.  Pt indicated he was going to the gym and was doing well but had back pain start and felt Rt leg go numb/tingling randomly.  Pt indicated xray had arthritis.  Pt indicated pressure/pain in back with prolonged walking/standing.  Hips can hurt as well.  Pt indicated variable knee pain as well.  Pt indicated having some trouble sleeping due to symptoms, waking several times at night.   Typically worse during the day.  No bowel/bladder.   Gym activity included treadmill, lifting machine weights with arms/legs, some ab exercises.   PERTINENT HISTORY:  History of bilateral hip replacements.  Arthritis, COPD, Coronary disease, History of MI, HTN, hyperlipidemia, thyroid  disease  PAIN:  NPRS scale: current 2-3/10 , at worst in last few weeks 8/10 Pain location: back with center bottom. , bilateral hips laterally  Pain description: tightness, achy Aggravating factors: prolonged standing/walking, at work at times.  Relieving factors: OTC  PRECAUTIONS: None  WEIGHT BEARING RESTRICTIONS: No  FALLS:  Has patient fallen in last 6 months? 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   PLOF: Independent, exercise, fishing, golf (unable to do)  PATIENT GOALS: Reduce pain, gain mobility   OBJECTIVE:   PATIENT SURVEYS:  01/06/2024 FOTO eval: 49   predicted:  52  SCREENING FOR RED FLAGS: 01/06/2024 Bowel or bladder incontinence: No Cauda equina syndrome: No  COGNITION: 01/06/2024 Overall cognitive status: WFL normal      SENSATION: 01/06/2024 [facility]  MUSCLE LENGTH: 01/06/2024 Passive Rt hamstring 45 deg, Lt 60 deg   POSTURE:  01/06/2024 Reduce lumbar lordosis in standing, equal iliac.   PALPATION: 01/06/2024 Trigger point glute med  LUMBAR ROM:  01/06/2024 Directional Preference Assessment: Centralization: none observed Peripheralization: none observed  AROM 01/06/2024   Flexion   Extension 75% WFL c mild tightness  Repeated x 5  Right lateral flexion To lateral knee joint  Left lateral flexion To lateral knee joint  Right rotation   Left rotation    (Blank rows = not tested)  LOWER EXTREMITY ROM:      Right 01/06/2024 Left 01/06/2024  Hip flexion    Hip extension    Hip abduction    Hip adduction    Hip internal rotation    Hip external rotation    Knee flexion    Knee extension    Ankle dorsiflexion    Ankle plantarflexion    Ankle inversion    Ankle eversion     (Blank rows = not tested)  LOWER EXTREMITY MMT:    MMT Right 01/06/2024 Left 01/06/2024  Hip flexion 5/5 5/5  Hip extension    Hip abduction 4/5 3+/5  Hip adduction    Hip internal rotation    Hip external rotation    Knee flexion 5/5 5/5  Knee extension 5/5 5/5  Ankle dorsiflexion 5/5 5/5  Ankle plantarflexion    Ankle inversion    Ankle eversion     (Blank rows = not tested)  LUMBAR SPECIAL TESTS:  01/06/2024 (-) slump bilateral, (-) crossed slr  FUNCTIONAL TESTS:  01/06/2024 No specific testing today  GAIT: 01/06/2024 Independent ambulation                                                                                                                                                                                                                  TODAY'S TREATMENT:                                                                                                         DATE: 01/06/2024  Therex:    HEP instruction/performance c cues for techniques, handout provided.  Trial set performed of each for comprehension and symptom assessment.  See below for exercise list  PATIENT EDUCATION:  01/06/2024 Education details: HEP, POC Person educated: Patient Education method: Explanation, Demonstration, Verbal cues, and Handouts Education comprehension: verbalized understanding, returned demonstration, and verbal cues required  HOME EXERCISE PROGRAM: Access Code: [email or web address] URL:  [email or web address] Date: 01/06/2024  Prepared by: Ozell Silvan  Exercises - Supine Lower Trunk Rotation  - 2-3 x daily - 7 x weekly - 1 sets - 3-5 reps - 15 hold - Supine Bridge  - 1-2 x daily - 7 x weekly - 1-2 sets - 10 reps - 2 hold - Standing Lumbar Extension with Counter  - 3-5 x daily - 7 x weekly - 1 sets - 5-10 reps - Standing Hip Hiking  - 1-2 x daily - 7 x weekly - 1 sets - 10 reps - 5 hold  ASSESSMENT:  CLINICAL IMPRESSION: Patient is a 58 y.o. who comes to clinic with complaints of back, bilateral hip pain with mobility, strength and movement coordination deficits that impair their ability to perform usual daily and recreational functional activities without increase difficulty/symptoms at this time.  Patient to benefit from skilled PT services to address impairments and limitations to improve to previous level of function without restriction secondary to condition.   OBJECTIVE IMPAIRMENTS: decreased activity tolerance, decreased coordination, decreased endurance, decreased mobility, difficulty walking, decreased ROM, decreased strength, hypomobility, impaired perceived functional ability, increased muscle spasms, impaired flexibility, improper body mechanics, postural dysfunction, and pain.   ACTIVITY LIMITATIONS: lifting, bending, standing, transfers, and locomotion level  PARTICIPATION LIMITATIONS: meal prep, cleaning, laundry, interpersonal relationship, driving, community activity, occupation, and yard work  PERSONAL FACTORS:  History of bilateral hip replacements.  Arthritis, COPD, Coronary disease, History of MI, HTN, hyperlipidemia, thyroid  disease  are also affecting patient's functional outcome.   REHAB POTENTIAL: Good  CLINICAL DECISION MAKING: Stable/uncomplicated  EVALUATION COMPLEXITY: Low   GOALS: Goals reviewed with patient? Yes  SHORT TERM GOALS: (target date for Short term goals are 3 weeks 01/27/2024)  1. Patient will demonstrate  independent use of home exercise program to maintain progress from in clinic treatments.  Goal status: New  LONG TERM GOALS: (target dates for all long term goals are 10 weeks  03/16/2024 )   1. Patient will demonstrate/report pain at worst less than or equal to 2/10 to facilitate minimal limitation in daily activity secondary to pain symptoms.  Goal status: New   2. Patient will demonstrate independent use of home exercise program to facilitate ability to maintain/progress functional gains from skilled physical therapy services.  Goal status: New   3. Patient will demonstrate FOTO outcome > or = 52 % to indicate reduced disability due to condition.  Goal status: New   4. Patient will demonstrate lumbar extension 100 % WFL s symptoms to facilitate upright standing, walking posture at PLOF s limitation.  Goal status: New   5.  Patient will demonstrate bilateral hip MMT 5/5 throughout to facilitate usual mobility at PLOF s restriction.   Goal status: New   6.  Patient will demonstrate/report ability to return to workout activity at PLOF s limitation.  Goal status: New    PLAN:  PT FREQUENCY: 1-2x/week  PT DURATION: 10 weeks  PLANNED INTERVENTIONS: Can include 02853- PT Re-evaluation, 97110-Therapeutic exercises, 97530- Therapeutic activity, [id number]- Neuromuscular re-education, 97535- Self Care, 97140- Manual therapy, [phone]- Gait training, [phone]- Orthotic Fit/training, [phone]- Canalith repositioning, [id number]- Aquatic Therapy, 97014- Electrical stimulation (unattended), [id number]- Electrical stimulation (manual), [id number]- Vasopneumatic device, [id number]- Ultrasound, [id number]- Traction (mechanical), [id number]- Ionotophoresis 4mg /ml Dexamethasone , Patient/Family education, Balance training, Stair training, Taping, Dry Needling, Joint mobilization, Joint manipulation, Spinal manipulation, Spinal mobilization, Scar mobilization, Vestibular training, Visual/preceptual remediation/compensation, DME instructions,  Cryotherapy, and Moist heat.  All performed as medically necessary.  All included unless contraindicated  PLAN FOR NEXT SESSION: Review  HEP knowledge/results.   Lumbar mobility gains, core strengthening.    Ozell Silvan, PT, DPT, OCS, ATC 01/06/24  4:03 PM

## 2024-01-06 ENCOUNTER — Encounter: Payer: Self-pay | Admitting: Rehabilitative and Restorative Service Providers"

## 2024-01-06 ENCOUNTER — Ambulatory Visit (INDEPENDENT_AMBULATORY_CARE_PROVIDER_SITE_OTHER): Payer: Managed Care, Other (non HMO) | Admitting: Rehabilitative and Restorative Service Providers"

## 2024-01-06 ENCOUNTER — Ambulatory Visit (AMBULATORY_SURGERY_CENTER): Payer: Managed Care, Other (non HMO)

## 2024-01-06 VITALS — Ht 72.0 in | Wt 180.0 lb

## 2024-01-06 DIAGNOSIS — M5459 Other low back pain: Secondary | ICD-10-CM

## 2024-01-06 DIAGNOSIS — M6281 Muscle weakness (generalized): Secondary | ICD-10-CM

## 2024-01-06 DIAGNOSIS — M25551 Pain in right hip: Secondary | ICD-10-CM

## 2024-01-06 DIAGNOSIS — Z800 Family history of malignant neoplasm of digestive organs: Secondary | ICD-10-CM

## 2024-01-06 DIAGNOSIS — R262 Difficulty in walking, not elsewhere classified: Secondary | ICD-10-CM

## 2024-01-06 DIAGNOSIS — M25552 Pain in left hip: Secondary | ICD-10-CM | POA: Diagnosis not present

## 2024-01-06 MED ORDER — NA SULFATE-K SULFATE-MG SULF 17.5-3.13-1.6 GM/177ML PO SOLN: 1.0000 | Freq: Once | ORAL | 0 refills | Status: AC

## 2024-01-06 NOTE — Progress Notes (Signed)

## 2024-01-10 ENCOUNTER — Encounter: Payer: Self-pay | Admitting: Physical Therapy

## 2024-01-10 ENCOUNTER — Ambulatory Visit (INDEPENDENT_AMBULATORY_CARE_PROVIDER_SITE_OTHER): Payer: Managed Care, Other (non HMO) | Admitting: Physical Therapy

## 2024-01-10 DIAGNOSIS — M5459 Other low back pain: Secondary | ICD-10-CM

## 2024-01-10 DIAGNOSIS — M25552 Pain in left hip: Secondary | ICD-10-CM

## 2024-01-10 DIAGNOSIS — M6281 Muscle weakness (generalized): Secondary | ICD-10-CM

## 2024-01-10 DIAGNOSIS — M25551 Pain in right hip: Secondary | ICD-10-CM

## 2024-01-10 DIAGNOSIS — R262 Difficulty in walking, not elsewhere classified: Secondary | ICD-10-CM

## 2024-01-10 NOTE — Therapy (Signed)
 OUTPATIENT PHYSICAL THERAPY TREATMENT   Patient Name: Kurt [name]. MRN: [id number] DOB:[birth year], 58 y.o., male Today's Date: 01/10/2024  END OF SESSION:  PT End of Session - 01/10/24 1618     Visit Number 2    Number of Visits 20    Date for PT Re-Evaluation 03/16/24    Authorization Type [employer]    Progress Note Due on Visit 10    PT Start Time 1554    PT Stop Time 1632    PT Time Calculation (min) 38 min    Activity Tolerance Patient tolerated treatment well    Behavior During Therapy WFL for tasks assessed/performed              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   Avascular necrosis of bone of right hip (HCC) 01/14/2016   Avascular necrosis of hip (HCC) 01/14/2016   Bowel habit changes 03/12/2020   COPD (chronic obstructive pulmonary disease) (HCC)    Coronary artery disease    Coronary disease 09/12/2015   Depression    General patient noncompliance 03/29/2013   History of MI (myocardial infarction) 09/12/2015   Hyperlipidemia    Hypertension    Internal hemorrhoids    Malnutrition of moderate degree 03/28/2016   Myocardial infarction (HCC)    20 yrs ago    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HCC)    Postoperative anemia due to acute blood loss 03/27/2016   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(HCC)    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   Thyroid  disease    reports that he has been low in the past but since he has stoped ETOH it has been normal   Past Surgical History:  Procedure Laterality Date   BIOPSY  12/05/2019   Procedure: BIOPSY;  Surgeon: San Sandor GAILS, DO;  Location: [facility];  Service: Gastroenterology;;   CARDIAC CATHETERIZATION     many years ago in 1990's   COLONOSCOPY  03/02/2019   COLONOSCOPY WITH PROPOFOL  N/A 12/05/2019   Procedure: COLONOSCOPY WITH PROPOFOL ;  Surgeon: San Sandor GAILS, DO;  Location: [facility];  Service: Gastroenterology;  Laterality: N/A;   CORONARY STENT PLACEMENT     TONSILLECTOMY     as a child   TOTAL HIP ARTHROPLASTY Right 01/14/2016   Procedure: TOTAL HIP ARTHROPLASTY;  Surgeon: Fonda Olmsted, MD;  Location: [facility];  Service: Orthopedics;  Laterality: Right;   TOTAL HIP ARTHROPLASTY Left 03/24/2016   Procedure: TOTAL HIP ARTHROPLASTY;  Surgeon: Fonda Olmsted, MD;  Location: [facility];  Service: Orthopedics;  Laterality: Left;   Patient Active Problem List   Diagnosis Date Noted   Fatigue 03/12/2020   History of colonic polyps    Polyp of sigmoid colon    Elevated LFTs 10/04/2017   COPD (chronic obstructive pulmonary disease) (HCC) 10/04/2017   Nocturia 10/04/2017   Alcoholic cardiomyopathy (HCC) 87/90/7983   Unstable angina (HCC) 11/25/2015   Arteriosclerosis of coronary artery 10/20/2015   HLD (hyperlipidemia) 10/20/2015   Alcoholism (HCC) 10/20/2015   History of MI (myocardial infarction) 09/12/2015   Anxiety and depression 07/31/2015   Testosterone  deficiency 07/31/2015   Encounter for annual general medical examination with abnormal findings in adult 07/31/2015   Benign essential HTN 06/28/2014   Acquired hypothyroidism 04/06/2013    PCP: Clark, Katherine K.  NP  REFERRING PROVIDER: Gretta Bertrum ORN, PA-C  REFERRING DIAG: M54.50 (ICD-10-CM) - Lumbar pain [phone] (ICD-10-CM) - Hx of total hip arthroplasty, left Z96.641 (ICD-10-CM) - Hx of total hip arthroplasty, right  Rationale for Evaluation and Treatment: Rehabilitation  THERAPY DIAG:  Other low back pain  Pain in left hip  Pain in right hip  Muscle weakness (generalized)  Difficulty in walking, not elsewhere classified  ONSET DATE: Late summer 2024  SUBJECTIVE:                                                                                                                                                                                           SUBJECTIVE  STATEMENT: Relays his back is feeling better (1/10 pain), the exercises have been helpful, his neck is bothering him today however 4/10.   PERTINENT HISTORY:  History of bilateral hip replacements.  Arthritis, COPD, Coronary disease, History of MI, HTN, hyperlipidemia, thyroid  disease  PAIN:  NPRS scale: current 1/10 Pain location: back with center bottom. , bilateral hips laterally  Pain description: tightness, achy Aggravating factors: prolonged standing/walking, at work at times.  Relieving factors: OTC  PRECAUTIONS: None  WEIGHT BEARING RESTRICTIONS: No  FALLS:  Has patient fallen in last 6 months? 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   PLOF: Independent, exercise, fishing, golf (unable to do)  PATIENT GOALS: Reduce pain, gain mobility   OBJECTIVE:   PATIENT SURVEYS:  01/06/2024 FOTO eval: 49   predicted:  52  SCREENING FOR RED FLAGS: 01/06/2024 Bowel or bladder incontinence: No Cauda equina syndrome: No  COGNITION: 01/06/2024 Overall cognitive status: WFL normal      SENSATION: 01/06/2024 [facility]  MUSCLE LENGTH: 01/06/2024 Passive Rt hamstring 45 deg, Lt 60 deg   POSTURE:  01/06/2024 Reduce lumbar lordosis in standing, equal iliac.   PALPATION: 01/06/2024 Trigger point glute med  LUMBAR ROM:  01/06/2024 Directional Preference Assessment: Centralization: none observed Peripheralization: none observed  AROM 01/06/2024  Flexion   Extension 75% WFL c mild tightness  Repeated x 5  Right lateral flexion To lateral knee joint  Left lateral flexion To lateral knee joint  Right rotation   Left rotation    (Blank rows = not tested)  LOWER EXTREMITY ROM:      Right 01/06/2024 Left 01/06/2024  Hip flexion    Hip extension    Hip abduction    Hip adduction    Hip internal rotation    Hip external rotation    Knee flexion    Knee extension    Ankle dorsiflexion  Ankle plantarflexion    Ankle inversion    Ankle eversion     (Blank rows = not tested)  LOWER EXTREMITY MMT:    MMT Right 01/06/2024 Left 01/06/2024  Hip flexion 5/5 5/5  Hip extension    Hip abduction 4/5 3+/5  Hip adduction    Hip internal rotation    Hip external rotation    Knee flexion 5/5 5/5  Knee extension 5/5 5/5  Ankle dorsiflexion 5/5 5/5  Ankle plantarflexion    Ankle inversion    Ankle eversion     (Blank rows = not tested)  LUMBAR SPECIAL TESTS:  01/06/2024 (-) slump bilateral, (-) crossed slr  FUNCTIONAL TESTS:  01/06/2024 No specific testing today  GAIT: 01/06/2024 Independent ambulation                                                                                                                                                                                                                  TODAY'S TREATMENT:                                                                                                          DATE: 01/10/2024  Therex: Nu step L5 UE/LE X 8 min Leg press DL 24# [id number], then SL 49# [id number] Single leg stance 20 sec X 3 bilat Standing hip hike 2X10 bilat Standing hip extensions green 2X10 bilat Standing hip abduction green 2X10 bilat Standing lumbar extensions 5 sec X10 Supine bridges 2 sec 2X10 Supine low trunk rotations 15 sec X 3 bilat   DATE: 01/06/2024  Therex:    HEP instruction/performance c cues for techniques, handout provided.  Trial set performed of each for comprehension and symptom assessment.  See below for exercise list  PATIENT EDUCATION:  01/06/2024 Education details: HEP, POC Person educated: Patient Education method: Explanation, Demonstration, Verbal cues, and Handouts Education comprehension: verbalized understanding, returned demonstration, and verbal cues required  HOME EXERCISE PROGRAM: Access Code: [email or web address] URL: [email or web address] Date: 01/06/2024 Prepared by: Ozell Silvan  Exercises -  Supine Lower Trunk Rotation  - 2-3 x  daily - 7 x weekly - 1 sets - 3-5 reps - 15 hold - Supine Bridge  - 1-2 x daily - 7 x weekly - 1-2 sets - 10 reps - 2 hold - Standing Lumbar Extension with Counter  - 3-5 x daily - 7 x weekly - 1 sets - 5-10 reps - Standing Hip Hiking  - 1-2 x daily - 7 x weekly - 1 sets - 10 reps - 5 hold  ASSESSMENT:  CLINICAL IMPRESSION: He appears to show some early progress from HEP. We reviewed them with him again and shows great understanding of these. Then added some additional strength work today with good tolerance noted and will monitor for any soreness to this.  OBJECTIVE IMPAIRMENTS: decreased activity tolerance, decreased coordination, decreased endurance, decreased mobility, difficulty walking, decreased ROM, decreased strength, hypomobility, impaired perceived functional ability, increased muscle spasms, impaired flexibility, improper body mechanics, postural dysfunction, and pain.   ACTIVITY LIMITATIONS: lifting, bending, standing, transfers, and locomotion level  PARTICIPATION LIMITATIONS: meal prep, cleaning, laundry, interpersonal relationship, driving, community activity, occupation, and yard work  PERSONAL FACTORS:  History of bilateral hip replacements.  Arthritis, COPD, Coronary disease, History of MI, HTN, hyperlipidemia, thyroid  disease  are also affecting patient's functional outcome.   REHAB POTENTIAL: Good  CLINICAL DECISION MAKING: Stable/uncomplicated  EVALUATION COMPLEXITY: Low   GOALS: Goals reviewed with patient? Yes  SHORT TERM GOALS: (target date for Short term goals are 3 weeks 01/27/2024)  1. Patient will demonstrate independent use of home exercise program to maintain progress from in clinic treatments.  Goal status: New  LONG TERM GOALS: (target dates for all long term goals are 10 weeks  03/16/2024 )   1. Patient will demonstrate/report pain at worst less than or equal to 2/10 to facilitate minimal limitation in daily  activity secondary to pain symptoms.  Goal status: New   2. Patient will demonstrate independent use of home exercise program to facilitate ability to maintain/progress functional gains from skilled physical therapy services.  Goal status: New   3. Patient will demonstrate FOTO outcome > or = 52 % to indicate reduced disability due to condition.  Goal status: New   4. Patient will demonstrate lumbar extension 100 % WFL s symptoms to facilitate upright standing, walking posture at PLOF s limitation.  Goal status: New   5.  Patient will demonstrate bilateral hip MMT 5/5 throughout to facilitate usual mobility at PLOF s restriction.   Goal status: New   6.  Patient will demonstrate/report ability to return to workout activity at PLOF s limitation.  Goal status: New    PLAN:  PT FREQUENCY: 1-2x/week  PT DURATION: 10 weeks  PLANNED INTERVENTIONS: Can include 02853- PT Re-evaluation, 97110-Therapeutic exercises, 97530- Therapeutic activity, [id number]- Neuromuscular re-education, 97535- Self Care, 97140- Manual therapy, [phone]- Gait training, [phone]- Orthotic Fit/training, [phone]- Canalith repositioning, [id number]- Aquatic Therapy, 97014- Electrical stimulation (unattended), [id number]- Electrical stimulation (manual), [id number]- Vasopneumatic device, [id number]- Ultrasound, [id number]- Traction (mechanical), [id number]- Ionotophoresis 4mg /ml Dexamethasone , Patient/Family education, Balance training, Stair training, Taping, Dry Needling, Joint mobilization, Joint manipulation, Spinal manipulation, Spinal mobilization, Scar mobilization, Vestibular training, Visual/preceptual remediation/compensation, DME instructions, Cryotherapy, and Moist heat.  All performed as medically necessary.  All included unless contraindicated  PLAN FOR NEXT SESSION: Lumbar mobility gains, core strengthening, hip strength  Redell Moose, PT, DPT 01/10/24 4:19 PM

## 2024-01-18 ENCOUNTER — Encounter: Payer: Self-pay | Admitting: Certified Registered Nurse Anesthetist

## 2024-01-19 ENCOUNTER — Encounter: Payer: Self-pay | Admitting: Gastroenterology

## 2024-01-20 ENCOUNTER — Ambulatory Visit (AMBULATORY_SURGERY_CENTER): Payer: 59 | Admitting: Gastroenterology

## 2024-01-20 ENCOUNTER — Encounter: Payer: Self-pay | Admitting: Gastroenterology

## 2024-01-20 VITALS — BP 148/99 | HR 68 | Temp 97.9°F | Resp 22 | Ht 72.0 in | Wt 180.0 lb

## 2024-01-20 DIAGNOSIS — Z1211 Encounter for screening for malignant neoplasm of colon: Secondary | ICD-10-CM | POA: Diagnosis present

## 2024-01-20 DIAGNOSIS — K648 Other hemorrhoids: Secondary | ICD-10-CM

## 2024-01-20 DIAGNOSIS — K644 Residual hemorrhoidal skin tags: Secondary | ICD-10-CM | POA: Diagnosis not present

## 2024-01-20 DIAGNOSIS — K641 Second degree hemorrhoids: Secondary | ICD-10-CM

## 2024-01-20 DIAGNOSIS — D125 Benign neoplasm of sigmoid colon: Secondary | ICD-10-CM

## 2024-01-20 DIAGNOSIS — D124 Benign neoplasm of descending colon: Secondary | ICD-10-CM

## 2024-01-20 DIAGNOSIS — Q438 Other specified congenital malformations of intestine: Secondary | ICD-10-CM

## 2024-01-20 DIAGNOSIS — Z800 Family history of malignant neoplasm of digestive organs: Secondary | ICD-10-CM

## 2024-01-20 MED ORDER — SODIUM CHLORIDE 0.9 % IV SOLN: 500.0000 mL | Freq: Once | INTRAVENOUS | Status: DC

## 2024-01-20 NOTE — Progress Notes (Signed)
GASTROENTEROLOGY PROCEDURE H&P NOTE   Primary Care Physician: Doreene Nest, NP    Reason for Procedure:  Colon polyp surveillance  Plan:    Colonoscopy  Patient is appropriate for endoscopic procedure(s) in the ambulatory ([facility]) setting.  The nature of the procedure, as well as the risks, benefits, and alternatives were carefully and thoroughly reviewed with the patient. Ample time for discussion and questions allowed. The patient understood, was satisfied, and agreed to proceed.     HPI: Kurt [name]. is a 58 y.o. male who presents for colonoscopy for ongoing colon polyp surveillance and colon cancer screening.  No active GI symptoms.    Last colonoscopy was 11/2019 and notable for 3 subcentimeter polyps (adenoma x 2, hyperplastic polyp x 1) resected from the rectum and sigmoid colon, tattoo in the sigmoid colon with normal appearance and no residual adenomatous tissue, small internal hemorrhoids, with recommendation repeat in 3 years.  Prior to that, colonoscopy in 02/2019 notable for a 20 mm flat polyp in the sigmoid colon, resected with saline inject-lift and hot snare then cold forceps with resection of the edges of the polyp for avulsion technique followed by tissue fulguration with the tip of the hot snare. Resection felt to be complete, and tattoo placed 3 to 4 cm distal to the resection site. Additionally, a 2 mm polyp resected from sigmoid by cold forceps and a 6 mm polyp resected from the sigmoid colon by cold snare. All tubular adenomas. Moderately tortuous hepatic flexure.  Family history notable for brother with colon cancer age 75.  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   Avascular necrosis of bone of right hip (HCC) 01/14/2016   Avascular necrosis of hip (HCC) 01/14/2016   Bowel habit changes 03/12/2020   COPD  (chronic obstructive pulmonary disease) (HCC)    Coronary artery disease    Coronary disease 09/12/2015   Depression    General patient noncompliance 03/29/2013   History of MI (myocardial infarction) 09/12/2015   Hyperlipidemia    Hypertension    Internal hemorrhoids    Malnutrition of moderate degree 03/28/2016   Myocardial infarction (HCC)    20 yrs ago    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HCC)    Postoperative anemia due to acute blood loss 03/27/2016   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(HCC)    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   Thyroid disease    reports that he has been low in the past but since he has stoped ETOH it has been normal    Past Surgical History:  Procedure Laterality Date   BIOPSY  12/05/2019   Procedure: BIOPSY;  Surgeon: Shellia Cleverly, DO;  Location: [facility];  Service: Gastroenterology;;   CARDIAC CATHETERIZATION     many years ago in 1990's   COLONOSCOPY  03/02/2019   COLONOSCOPY WITH PROPOFOL N/A 12/05/2019   Procedure: COLONOSCOPY WITH PROPOFOL;  Surgeon: Shellia Cleverly, DO;  Location: [facility];  Service: Gastroenterology;  Laterality: N/A;   CORONARY STENT PLACEMENT     TONSILLECTOMY     as a child   TOTAL HIP ARTHROPLASTY Right 01/14/2016   Procedure: TOTAL HIP ARTHROPLASTY;  Surgeon: Teryl Lucy, MD;  Location: [facility];  Service: Orthopedics;  Laterality: Right;   TOTAL HIP ARTHROPLASTY Left 03/24/2016  Procedure: TOTAL HIP ARTHROPLASTY;  Surgeon: Teryl Lucy, MD;  Location: [facility];  Service: Orthopedics;  Laterality: Left;    Prior to Admission medications   Medication Sig Start Date End Date Taking? Authorizing Provider  aspirin EC 81 MG tablet Take 1 tablet (81 mg total) by mouth daily. 01/05/20  Yes Alver Sorrow, NP  atorvastatin (LIPITOR) 80 MG tablet Take 1 tablet (80 mg total) by mouth daily. For cholesterol 05/12/22 01/20/24 Yes Furth, Cadence H, PA-C  carvedilol (COREG) 6.25 MG tablet Take 1 tablet (6.25 mg total) by mouth 2  (two) times daily with a meal. 01/13/21  Yes Gollan, Tollie Pizza, MD  escitalopram (LEXAPRO) 10 MG tablet Take 1 tablet (10 mg total) by mouth daily. for anxiety and depression. 12/09/23  Yes Doreene Nest, NP  ezetimibe (ZETIA) 10 MG tablet TAKE 1 TABLET BY MOUTH ONCE DAILY FOR  CHOLESTEROL. KEEP FUTURE APPOINTMENT FOR MORE REFILLS. 09/08/23  Yes Antonieta Iba, MD  lisinopril (ZESTRIL) 20 MG tablet Take 1 tablet by mouth once daily for blood pressure 09/22/23  Yes Gollan, Tollie Pizza, MD  Multiple Vitamin (MULTIVITAMIN WITH MINERALS) TABS tablet Take 1 tablet by mouth daily.   Yes [provider]  Omega-3 Fatty Acids (FISH OIL) 1000 MG CAPS Take 1,000 mg by mouth daily.    Yes [provider]  VENTOLIN HFA 108 (90 Base) MCG/ACT inhaler Inhale 2 puffs into the lungs every 4 (four) hours as needed for wheezing or shortness of breath. 08/08/21  Yes Eustaquio Boyden, MD  buPROPion (WELLBUTRIN XL) 150 MG 24 hr tablet TAKE 1 TABLET BY MOUTH ONCE DAILY FOR ANXIETY AND FOR DEPRESSION 12/09/23   Doreene Nest, NP  nitroGLYCERIN (NITROSTAT) 0.4 MG SL tablet Place 1 tablet (0.4 mg total) under the tongue every 5 (five) minutes as needed for chest pain. 01/13/21 08/14/22  Antonieta Iba, MD    Current Outpatient Medications  Medication Sig Dispense Refill   aspirin EC 81 MG tablet Take 1 tablet (81 mg total) by mouth daily. 90 tablet 3   atorvastatin (LIPITOR) 80 MG tablet Take 1 tablet (80 mg total) by mouth daily. For cholesterol 90 tablet 3   carvedilol (COREG) 6.25 MG tablet Take 1 tablet (6.25 mg total) by mouth 2 (two) times daily with a meal. 180 tablet 3   escitalopram (LEXAPRO) 10 MG tablet Take 1 tablet (10 mg total) by mouth daily. for anxiety and depression. 90 tablet 2   ezetimibe (ZETIA) 10 MG tablet TAKE 1 TABLET BY MOUTH ONCE DAILY FOR  CHOLESTEROL. KEEP FUTURE APPOINTMENT FOR MORE REFILLS. 90 tablet 2   lisinopril (ZESTRIL) 20 MG tablet Take 1 tablet by mouth once  daily for blood pressure 90 tablet 3   Multiple Vitamin (MULTIVITAMIN WITH MINERALS) TABS tablet Take 1 tablet by mouth daily.     Omega-3 Fatty Acids (FISH OIL) 1000 MG CAPS Take 1,000 mg by mouth daily.      VENTOLIN HFA 108 (90 Base) MCG/ACT inhaler Inhale 2 puffs into the lungs every 4 (four) hours as needed for wheezing or shortness of breath. 18 g 3   buPROPion (WELLBUTRIN XL) 150 MG 24 hr tablet TAKE 1 TABLET BY MOUTH ONCE DAILY FOR ANXIETY AND FOR DEPRESSION 90 tablet 2   nitroGLYCERIN (NITROSTAT) 0.4 MG SL tablet Place 1 tablet (0.4 mg total) under the tongue every 5 (five) minutes as needed for chest pain. 100 tablet 1   Current Facility-Administered Medications  Medication Dose Route Frequency  Provider Last Rate Last Admin   0.9 %  sodium chloride infusion  500 mL Intravenous Once Antwine Agosto V, DO        Allergies as of 01/20/2024 - Review Complete 01/20/2024  Allergen Reaction Noted   Peanut-containing drug products Anaphylaxis 12/23/2014   Erythromycin Other (See Comments) 12/23/2014    Family History  Problem Relation Age of Onset   Ovarian cancer Mother    Stomach cancer Mother    Cancer Mother    Dementia Father    Colon cancer Brother 60   Cancer Maternal Grandmother    Cancer Maternal Grandfather    Cancer Paternal Grandmother    Cancer Paternal Grandfather    Colon polyps Neg Hx    Esophageal cancer Neg Hx    Rectal cancer Neg Hx     Social History   Socioeconomic History   Marital status: Legally Separated    Spouse name: Not on file   Number of children: Not on file   Years of education: Not on file   Highest education level: Not on file  Occupational History   Not on file  Tobacco Use   Smoking status: Former    Current packs/day: 0.00    Average packs/day: 1.5 packs/day for 20.0 years (30.0 ttl pk-yrs)    Types: Cigarettes    Start date: 11/25/1995    Quit date: 11/25/2015    Years since quitting: 8.1   Smokeless tobacco: Never  Vaping  Use   Vaping status: Never Used  Substance and Sexual Activity   Alcohol use: No    Alcohol/week: 0.0 standard drinks of alcohol    Comment: 4 months-recovering alcoholic per pt   Drug use: No   Sexual activity: Not on file  Other Topics Concern   Not on file  Social History Narrative   Separated.   Works as a Advertising account planner.   Enjoys fishing, Publishing copy, golfing.   Social Drivers of Corporate investment banker Strain: Not on file  Food Insecurity: Not on file  Transportation Needs: Not on file  Physical Activity: Not on file  Stress: Not on file  Social Connections: Not on file  Intimate Partner Violence: Not on file    Physical Exam: Vital signs in last 24 hours: @BP  (!) 142/88   Pulse 89   Temp 97.9 F (36.6 C)   Ht 6' (1.829 m)   Wt 180 lb (81.6 kg)   SpO2 96%   BMI 24.41 kg/m  GEN: NAD EYE: Sclerae anicteric ENT: MMM CV: Non-tachycardic Pulm: CTA b/l GI: Soft, NT/ND NEURO:  Alert & Oriented x 3   Doristine Locks, DO [facility]   01/20/2024 7:50 AM

## 2024-01-20 NOTE — Patient Instructions (Signed)

## 2024-01-20 NOTE — Progress Notes (Signed)
0820 Robinul 0.2 mg IV given due large amount of secretions upon assessment. Patient experiencing nausea and vomiting.  MD updated and Zofran 4 mg IV given, vss

## 2024-01-20 NOTE — Progress Notes (Signed)
Report given to PACU, vss 

## 2024-01-20 NOTE — Progress Notes (Signed)
Called to room to assist during endoscopic procedure.  Patient ID and intended procedure confirmed with present staff. Received instructions for my participation in the procedure from the performing physician.  

## 2024-01-20 NOTE — Progress Notes (Signed)
Pt's states no medical or surgical changes since previsit or office visit. 

## 2024-01-20 NOTE — Op Note (Signed)
[facility] Patient Name: Kurt [name] Procedure Date: 01/20/2024 7:19 AM MRN: [id number] Endoscopist: Doristine Locks , MD, [id number] Age: 58 Referring MD:  Date of Birth: 1966-02-17 Gender: Male Account #: [id number] Procedure:                Colonoscopy Indications:              Surveillance: Personal history of adenomatous                            polyps on last colonoscopy 3 years ago                           Last colonoscopy was 11/2019 and notable for 3                            subcentimeter polyps (adenoma x 2, hyperplastic                            polyp x 1) resected from the rectum and sigmoid                            colon, tattoo in the sigmoid colon with normal                            appearance and no residual adenomatous tissue,                            small internal hemorrhoids, with recommendation                            repeat in 3 years.                           Prior to that, colonoscopy in 02/2019 notable for a                            20 mm flat polyp in the sigmoid colon, resected                            with saline inject-lift and hot snare then cold                            forceps with resection of the edges of the polyp                            for avulsion technique followed by tissue                            fulguration with the tip of the hot snare.                            Resection felt to be complete, and tattoo placed 3  to 4 cm distal to the resection site. Additionally,                            a 2 mm polyp resected from sigmoid by cold forceps                            and a 6 mm polyp resected from the sigmoid colon by                            cold snare. All tubular adenomas. Moderately                            tortuous hepatic flexure.                           Family history notable for brother with colon                            cancer age 45. Medicines:                 Monitored Anesthesia Care, Zofran 4 mg, Robinul 0.2                            mg Procedure:                Pre-Anesthesia Assessment:                           - Prior to the procedure, a History and Physical                            was performed, and patient medications and                            allergies were reviewed. The patient's tolerance of                            previous anesthesia was also reviewed. The risks                            and benefits of the procedure and the sedation                            options and risks were discussed with the patient.                            All questions were answered, and informed consent                            was obtained. Prior Anticoagulants: The patient has                            taken no anticoagulant or antiplatelet agents. ASA  Grade Assessment: III - A patient with severe                            systemic disease. After reviewing the risks and                            benefits, the patient was deemed in satisfactory                            condition to undergo the procedure.                           After obtaining informed consent, the colonoscope                            was passed under direct vision. Throughout the                            procedure, the patient's blood pressure, pulse, and                            oxygen saturations were monitored continuously. The                            CF [id number] #[id number] was introduced through the anus                            and advanced to the the cecum, identified by                            appendiceal orifice and ileocecal valve. The                            colonoscopy was performed without difficulty. The                            patient did have an episode of vomiting during the                            procedure. This was quickly recognized and contents                            suctioned from the  mouth without any aspiration. He                            remained hemodynamically stable and no oxygen                            desaturation noted. The patient otherwise tolerated                            the procedure well. The quality of the bowel  preparation was good. The ileocecal valve,                            appendiceal orifice, and rectum were photographed. Scope In: 8:00:09 AM Scope Out: 8:35:22 AM Scope Withdrawal Time: 0 hours 28 minutes 55 seconds  Total Procedure Duration: 0 hours 35 minutes 13 seconds  Findings:                 Hemorrhoids were found on perianal exam.                           Two sessile polyps were found in the descending                            colon. The polyps were 2 to 4 mm in size. These                            polyps were removed with a cold snare. Resection                            and retrieval were complete. Estimated blood loss                            was minimal.                           A 5 mm polyp was found in the distal sigmoid colon.                            The polyp was sessile. The polyp was removed with a                            cold snare. Resection and retrieval were complete.                            Estimated blood loss was minimal.                           A tattoo was seen in the sigmoid colon. The tattoo                            site appeared normal. Made several passes through                            the sigmoid colon. No residual polyp noted.                           Non-bleeding internal hemorrhoids were found during                            retroflexion. The hemorrhoids were small.                           The sigmoid colon was moderately tortuous.  Advancing the scope required using manual pressure                            and straightening and shortening the scope to                            obtain bowel loop  reduction. Complications:            No immediate complications. Estimated Blood Loss:     Estimated blood loss was minimal. Impression:               - Hemorrhoids found on perianal exam.                           - Two 2 to 4 mm polyps in the descending colon,                            removed with a cold snare. Resected and retrieved.                           - One 5 mm polyp in the distal sigmoid colon,                            removed with a cold snare. Resected and retrieved.                           - A tattoo was seen in the sigmoid colon. The                            tattoo site appeared normal.                           - Non-bleeding internal hemorrhoids.                           - Tortuous colon. Recommendation:           - Patient has a contact number available for                            emergencies. The signs and symptoms of potential                            delayed complications were discussed with the                            patient. Return to normal activities tomorrow.                            Written discharge instructions were provided to the                            patient.                           - Resume previous  diet.                           - Continue present medications.                           - Await pathology results.                           - Repeat colonoscopy for surveillance based on                            pathology results.                           - Return to GI office PRN. Doristine Locks, MD 01/20/2024 8:43:12 AM

## 2024-01-21 ENCOUNTER — Telehealth: Payer: Self-pay

## 2024-01-21 NOTE — Telephone Encounter (Signed)
  Follow up Call-     01/20/2024    7:31 AM  Call back number  Post procedure Call Back phone  # [phone]  Permission to leave phone message Yes     Patient questions:  Do you have a fever, pain , or abdominal swelling? No. Pain Score  0 *  Have you tolerated food without any problems? Yes.    Have you been able to return to your normal activities? Yes.    Do you have any questions about your discharge instructions: Diet   No. Medications  No. Follow up visit  No.  Do you have questions or concerns about your Care? No.  Actions: * If pain score is 4 or above: No action needed, pain <4.

## 2024-01-24 LAB — SURGICAL PATHOLOGY

## 2024-01-26 ENCOUNTER — Ambulatory Visit (INDEPENDENT_AMBULATORY_CARE_PROVIDER_SITE_OTHER): Payer: Managed Care, Other (non HMO) | Admitting: Rehabilitative and Restorative Service Providers"

## 2024-01-26 ENCOUNTER — Encounter: Payer: Self-pay | Admitting: Rehabilitative and Restorative Service Providers"

## 2024-01-26 DIAGNOSIS — M5459 Other low back pain: Secondary | ICD-10-CM

## 2024-01-26 DIAGNOSIS — M25551 Pain in right hip: Secondary | ICD-10-CM

## 2024-01-26 DIAGNOSIS — M6281 Muscle weakness (generalized): Secondary | ICD-10-CM | POA: Diagnosis not present

## 2024-01-26 DIAGNOSIS — R262 Difficulty in walking, not elsewhere classified: Secondary | ICD-10-CM

## 2024-01-26 DIAGNOSIS — M25552 Pain in left hip: Secondary | ICD-10-CM | POA: Diagnosis not present

## 2024-01-26 NOTE — Therapy (Addendum)
 OUTPATIENT PHYSICAL THERAPY TREATMENT / DISCHARGE   Patient Name: Kurt [name]. MRN: [id number] DOB:1966/12/12, 58 y.o., male Today's Date: 01/26/2024  END OF SESSION:  PT End of Session - 01/26/24 1446     Visit Number 3    Number of Visits 20    Date for PT Re-Evaluation 03/16/24    Authorization Type [employer]    Progress Note Due on Visit 10    PT Start Time 1446    PT Stop Time 1525    PT Time Calculation (min) 39 min    Activity Tolerance Patient tolerated treatment well    Behavior During Therapy WFL for tasks assessed/performed               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   Avascular necrosis of bone of right hip (HCC) 01/14/2016   Avascular necrosis of hip (HCC) 01/14/2016   Bowel habit changes 03/12/2020   COPD (chronic obstructive pulmonary disease) (HCC)    Coronary artery disease    Coronary disease 09/12/2015   Depression    General patient noncompliance 03/29/2013   History of MI (myocardial infarction) 09/12/2015   Hyperlipidemia    Hypertension    Internal hemorrhoids    Malnutrition of moderate degree 03/28/2016   Myocardial infarction (HCC)    20 yrs ago    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HCC)    Postoperative anemia due to acute blood loss 03/27/2016   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(HCC)    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   Thyroid disease    reports that he has been low in the past but since he has stoped ETOH it has been normal   Past Surgical History:  Procedure Laterality Date   BIOPSY  12/05/2019   Procedure: BIOPSY;  Surgeon: Annis Kinder, DO;  Location: [facility];  Service: Gastroenterology;;   CARDIAC CATHETERIZATION     many years ago in 1990's   COLONOSCOPY  03/02/2019   COLONOSCOPY WITH PROPOFOL N/A 12/05/2019   Procedure: COLONOSCOPY WITH PROPOFOL;  Surgeon:  Annis Kinder, DO;  Location: [facility];  Service: Gastroenterology;  Laterality: N/A;   CORONARY STENT PLACEMENT     TONSILLECTOMY     as a child   TOTAL HIP ARTHROPLASTY Right 01/14/2016   Procedure: TOTAL HIP ARTHROPLASTY;  Surgeon: Osa Blase, MD;  Location: [facility];  Service: Orthopedics;  Laterality: Right;   TOTAL HIP ARTHROPLASTY Left 03/24/2016   Procedure: TOTAL HIP ARTHROPLASTY;  Surgeon: Osa Blase, MD;  Location: [facility];  Service: Orthopedics;  Laterality: Left;   Patient Active Problem List   Diagnosis Date Noted   Fatigue 03/12/2020   History of colonic polyps    Polyp of sigmoid colon    Elevated LFTs 10/04/2017   COPD (chronic obstructive pulmonary disease) (HCC) 10/04/2017   Nocturia 10/04/2017   Alcoholic cardiomyopathy (HCC) 40/98/1191   Unstable angina (HCC) 11/25/2015   Arteriosclerosis of coronary artery 10/20/2015   HLD (hyperlipidemia) 10/20/2015   Alcoholism (HCC) 10/20/2015   History of MI (myocardial infarction) 09/12/2015   Anxiety and depression 07/31/2015   Testosterone deficiency 07/31/2015   Encounter for annual general medical examination with abnormal findings in adult 07/31/2015   Benign essential HTN 06/28/2014   Acquired hypothyroidism 04/06/2013    PCP:  Gabriel John NP  REFERRING PROVIDER: Bronson Canny, PA-C  REFERRING DIAG: M54.50 (ICD-10-CM) - Lumbar pain [phone] (ICD-10-CM) - Hx of total hip arthroplasty, left Z96.641 (ICD-10-CM) - Hx of total hip arthroplasty, right  Rationale for Evaluation and Treatment: Rehabilitation  THERAPY DIAG:  Other low back pain  Pain in left hip  Pain in right hip  Muscle weakness (generalized)  Difficulty in walking, not elsewhere classified  ONSET DATE: Late summer 2024  SUBJECTIVE:                                                                                                                                                                                            SUBJECTIVE STATEMENT: Pt indicated back has been doing really well, usually 80-90% back to normal. Pt indicated HEP does help things feel better.   Pt indicated having intermittent lower cervical/upper thoracic junction.  Pt indicated symptoms central and reported sharp at times.  Pt indicated noting more common symptoms in end of the day.  Pt indicated some keeping up at night.   A little stretching has helped at times.     PERTINENT HISTORY:  History of bilateral hip replacements.  Arthritis, COPD, Coronary disease, History of MI, HTN, hyperlipidemia, thyroid disease  PAIN:  NPRS scale:   6/10 current for neck Pain location:  Pain description: sharp times.  Aggravating factors: prolonged standing/walking, at work at times.  Relieving factors: OTC  PRECAUTIONS: None  WEIGHT BEARING RESTRICTIONS: No  FALLS:  Has patient fallen in last 6 months? 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   PLOF: Independent, exercise, fishing, golf (unable to do)  PATIENT GOALS: Reduce pain, gain mobility   OBJECTIVE:   PATIENT SURVEYS:  01/26/2024: FOTO update:  68  01/06/2024 FOTO eval: 49   predicted:  52  SCREENING FOR RED FLAGS: 01/06/2024 Bowel or bladder incontinence: No Cauda equina syndrome: No  COGNITION: 01/06/2024 Overall cognitive status: WFL normal      SENSATION: 01/06/2024 [facility]  MUSCLE LENGTH: 01/26/2024:  Passive Rt hamstring 70 deg, Lt 75 deg  01/06/2024 Passive Rt hamstring 45 deg, Lt 60 deg   POSTURE:  01/06/2024 Reduce lumbar lordosis in standing, equal iliac.   PALPATION: 01/06/2024 Trigger point glute med  LUMBAR ROM:  01/06/2024 Directional Preference Assessment: Centralization: none observed Peripheralization: none observed  AROM 01/06/2024 01/26/2024  Flexion    Extension 75% WFL c mild tightness  Repeated x 5 100% WFL  Right lateral flexion To lateral  knee joint   Left lateral flexion To lateral knee joint  Right rotation    Left rotation     (Blank rows = not tested)  LOWER EXTREMITY ROM:      Right 01/06/2024 Left 01/06/2024  Hip flexion    Hip extension    Hip abduction    Hip adduction    Hip internal rotation    Hip external rotation    Knee flexion    Knee extension    Ankle dorsiflexion    Ankle plantarflexion    Ankle inversion    Ankle eversion     (Blank rows = not tested)  LOWER EXTREMITY MMT:    MMT Right 01/06/2024 Left 01/06/2024 Right 01/26/2024 Left 01/26/2024  Hip flexion 5/5 5/5    Hip extension      Hip abduction 4/5 3+/5 5/5 5/5  Hip adduction      Hip internal rotation      Hip external rotation      Knee flexion 5/5 5/5    Knee extension 5/5 5/5    Ankle dorsiflexion 5/5 5/5    Ankle plantarflexion      Ankle inversion      Ankle eversion       (Blank rows = not tested)  LUMBAR SPECIAL TESTS:  01/06/2024 (-) slump bilateral, (-) crossed slr  FUNCTIONAL TESTS:  01/06/2024 No specific testing today  GAIT: 01/06/2024 Independent ambulation                                                                                                                                                                                                                  TODAY'S TREATMENT:                                                                                                         DATE: 01/26/2024  Therex: Nustep Lvl 6 8 mins UE/LE Tband rows blue band 2 x 15 bilateral Tband gh ext blue band 2 x 15 bilateral  Blue band ER c arms at side and scapular retraction focus 2 x 15  Cervical retraction AROM x 5 Cervical isometric retraction with  ball behind head 5 sec hold x 10    Review of existing HEP and discussion about inclusion of planks, pball press down in workout routine.    TODAY'S TREATMENT:                                                                                                         DATE:  01/10/2024  Therex: Nu step L5 UE/LE X 8 min Leg press DL 60# 4V40, then SL 98# 1X91 Single leg stance 20 sec X 3 bilat Standing hip hike 2X10 bilat Standing hip extensions green 2X10 bilat Standing hip abduction green 2X10 bilat Standing lumbar extensions 5 sec X10 Supine bridges 2 sec 2X10 Supine low trunk rotations 15 sec X 3 bilat   TODAY'S TREATMENT:                                                                                                         DATE: 01/06/2024  Therex:    HEP instruction/performance c cues for techniques, handout provided.  Trial set performed of each for comprehension and symptom assessment.  See below for exercise list  PATIENT EDUCATION:  01/26/2024 Education details: HEP update Person educated: Patient Education method: Explanation, Demonstration, Verbal cues, and Handouts Education comprehension: verbalized understanding, returned demonstration, and verbal cues required  HOME EXERCISE PROGRAM: Access Code: [email or web address] URL: [email or web address] Date: 01/26/2024 Prepared by: Bonna Bustard  Exercises - Supine Lower Trunk Rotation  - 2-3 x daily - 7 x weekly - 1 sets - 3-5 reps - 15 hold - Supine Bridge  - 1-2 x daily - 7 x weekly - 1-2 sets - 10 reps - 2 hold - Standing Lumbar Extension with Counter  - 3-5 x daily - 7 x weekly - 1 sets - 5-10 reps - Standing Hip Hiking  - 1-2 x daily - 7 x weekly - 1 sets - 10 reps - 5 hold - Standing Bilateral Low Shoulder Row with Anchored Resistance  - 1-2 x daily - 7 x weekly - 2 sets - 10-15 reps - Shoulder Extension with Resistance  - 1-2 x daily - 7 x weekly - 2 sets - 10-15 reps - Standing Shoulder External Rotation with Resistance  - 1-2 x daily - 7 x weekly - 2 sets - 10-15 reps - Seated Cervical Retraction  - 3-5 x daily - 7 x weekly - 1 sets - 5 reps - 5 hold - Cervical Retraction at Wall  - 1-2 x daily - 7 x weekly - 1 sets - 10 reps - 5 hold  ASSESSMENT:  CLINICAL IMPRESSION: Symptoms  from cervico/thoracic region were reported and  addressed in some HEP additions with continued focus on lumbar mobility improvements and postural strengthening to help all symptoms area.  Pt may be approaching HEP trial based off symptom response in next week.   OBJECTIVE IMPAIRMENTS: decreased activity tolerance, decreased coordination, decreased endurance, decreased mobility, difficulty walking, decreased ROM, decreased strength, hypomobility, impaired perceived functional ability, increased muscle spasms, impaired flexibility, improper body mechanics, postural dysfunction, and pain.   ACTIVITY LIMITATIONS: lifting, bending, standing, transfers, and locomotion level  PARTICIPATION LIMITATIONS: meal prep, cleaning, laundry, interpersonal relationship, driving, community activity, occupation, and yard work  PERSONAL FACTORS:  History of bilateral hip replacements.  Arthritis, COPD, Coronary disease, History of MI, HTN, hyperlipidemia, thyroid disease  are also affecting patient's functional outcome.   REHAB POTENTIAL: Good  CLINICAL DECISION MAKING: Stable/uncomplicated  EVALUATION COMPLEXITY: Low   GOALS: Goals reviewed with patient? Yes  SHORT TERM GOALS: (target date for Short term goals are 3 weeks 01/27/2024)  1. Patient will demonstrate independent use of home exercise program to maintain progress from in clinic treatments.  Goal status: Met 01/26/2024  LONG TERM GOALS: (target dates for all long term goals are 10 weeks  03/16/2024 )   1. Patient will demonstrate/report pain at worst less than or equal to 2/10 to facilitate minimal limitation in daily activity secondary to pain symptoms.  Goal status: on going 01/26/2024   2. Patient will demonstrate independent use of home exercise program to facilitate ability to maintain/progress functional gains from skilled physical therapy services.  Goal status: on going 01/26/2024   3. Patient will demonstrate FOTO outcome > or = 52 % to  indicate reduced disability due to condition.  Goal status: Met 01/26/2024   4. Patient will demonstrate lumbar extension 100 % WFL s symptoms to facilitate upright standing, walking posture at PLOF s limitation.  Goal status: on going 01/26/2024   5.  Patient will demonstrate bilateral hip MMT 5/5 throughout to facilitate usual mobility at PLOF s restriction.   Goal status: on going 01/26/2024   6.  Patient will demonstrate/report ability to return to workout activity at PLOF s limitation.  Goal status: on going 01/26/2024    PLAN:  PT FREQUENCY: 1-2x/week  PT DURATION: 10 weeks  PLANNED INTERVENTIONS: Can include 16109- PT Re-evaluation, 97110-Therapeutic exercises, 97530- Therapeutic activity, 97112- Neuromuscular re-education, 97535- Self Care, 97140- Manual therapy, [phone]- Gait training, [phone]- Orthotic Fit/training, [phone]- Canalith repositioning, [id number]- Aquatic Therapy, 97014- Electrical stimulation (unattended), [id number]- Electrical stimulation (manual), [id number]- Vasopneumatic device, [id number]- Ultrasound, [id number]- Traction (mechanical), [id number]- Ionotophoresis 4mg /ml Dexamethasone, Patient/Family education, Balance training, Stair training, Taping, Dry Needling, Joint mobilization, Joint manipulation, Spinal manipulation, Spinal mobilization, Scar mobilization, Vestibular training, Visual/preceptual remediation/compensation, DME instructions, Cryotherapy, and Moist heat.  All performed as medically necessary.  All included unless contraindicated  PLAN FOR NEXT SESSION: Recheck HEP additions, possible HEP transition.    Bonna Bustard, PT, DPT, OCS, ATC 01/26/24  3:28 PM    PHYSICAL THERAPY DISCHARGE SUMMARY  Visits from Start of Care: 3  Current functional level related to goals / functional outcomes: See note   Remaining deficits: See note   Education / Equipment: HEP  Patient goals were  mostly met . Patient is being discharged due to not returning since the last  visit.  Bonna Bustard, PT, DPT, OCS, ATC 04/11/24  1:54 PM

## 2024-01-27 ENCOUNTER — Encounter: Payer: Self-pay | Admitting: Gastroenterology

## 2024-02-01 ENCOUNTER — Encounter: Payer: Managed Care, Other (non HMO) | Admitting: Physical Therapy

## 2024-02-10 ENCOUNTER — Encounter: Payer: Managed Care, Other (non HMO) | Admitting: Physical Therapy

## 2024-02-10 ENCOUNTER — Ambulatory Visit: Payer: Managed Care, Other (non HMO) | Admitting: Orthopaedic Surgery

## 2024-02-14 ENCOUNTER — Encounter: Payer: Managed Care, Other (non HMO) | Admitting: Physical Therapy

## 2024-03-27 NOTE — Progress Notes (Deleted)
 Office Visit Note  Patient: Kurt [name].             Date of Birth: 1966-09-25           MRN: [id number]             PCP: Doreene Nest, NP Referring: Doreene Nest, NP Visit Date: 04/10/2024 Occupation: @GUAROCC @  Subjective:  No chief complaint on file.   History of Present Illness: Kurt Kemler. is a 58 y.o. male ***     Activities of Daily Living:  Patient reports morning stiffness for *** {minute/hour:19697}.   Patient {ACTIONS;DENIES/REPORTS:21021675::"Denies"} nocturnal pain.  Difficulty dressing/grooming: {ACTIONS;DENIES/REPORTS:21021675::"Denies"} Difficulty climbing stairs: {ACTIONS;DENIES/REPORTS:21021675::"Denies"} Difficulty getting out of chair: {ACTIONS;DENIES/REPORTS:21021675::"Denies"} Difficulty using hands for taps, buttons, cutlery, and/or writing: {ACTIONS;DENIES/REPORTS:21021675::"Denies"}  No Rheumatology ROS completed.   PMFS History:  Patient Active Problem List   Diagnosis Date Noted   Fatigue 03/12/2020   History of colonic polyps    Polyp of sigmoid colon    Elevated LFTs 10/04/2017   COPD (chronic obstructive pulmonary disease) (HCC) 10/04/2017   Nocturia 10/04/2017   Alcoholic cardiomyopathy (HCC) 52/84/1324   Unstable angina (HCC) 11/25/2015   Arteriosclerosis of coronary artery 10/20/2015   HLD (hyperlipidemia) 10/20/2015   Alcoholism (HCC) 10/20/2015   History of MI (myocardial infarction) 09/12/2015   Anxiety and depression 07/31/2015   Testosterone deficiency 07/31/2015   Encounter for annual general medical examination with abnormal findings in adult 07/31/2015   Benign essential HTN 06/28/2014   Acquired hypothyroidism 04/06/2013    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   Avascular necrosis of bone of right hip (HCC) 01/14/2016   Avascular necrosis of hip  (HCC) 01/14/2016   Bowel habit changes 03/12/2020   COPD (chronic obstructive pulmonary disease) (HCC)    Coronary artery disease    Coronary disease 09/12/2015   Depression    General patient noncompliance 03/29/2013   History of MI (myocardial infarction) 09/12/2015   Hyperlipidemia    Hypertension    Internal hemorrhoids    Malnutrition of moderate degree 03/28/2016   Myocardial infarction (HCC)    20 yrs ago    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HCC)    Postoperative anemia due to acute blood loss 03/27/2016   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(HCC)    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   Thyroid disease    reports that he has been low in the past but since he has stoped ETOH it has been normal    Family History  Problem Relation Age of Onset   Ovarian cancer Mother    Stomach cancer Mother    Cancer Mother    Dementia Father    Colon cancer Brother 51   Cancer Maternal Grandmother    Cancer Maternal Grandfather    Cancer Paternal Grandmother    Cancer Paternal Grandfather    Colon polyps Neg Hx    Esophageal cancer Neg Hx    Rectal cancer Neg Hx    Past Surgical History:  Procedure Laterality Date   BIOPSY  12/05/2019   Procedure: BIOPSY;  Surgeon: Shellia Cleverly, DO;  Location: [facility];  Service: Gastroenterology;;   CARDIAC CATHETERIZATION     many years ago in 1990's   COLONOSCOPY  03/02/2019   COLONOSCOPY WITH  PROPOFOL N/A 12/05/2019   Procedure: COLONOSCOPY WITH PROPOFOL;  Surgeon: Shellia Cleverly, DO;  Location: [facility];  Service: Gastroenterology;  Laterality: N/A;   CORONARY STENT PLACEMENT     TONSILLECTOMY     as a child   TOTAL HIP ARTHROPLASTY Right 01/14/2016   Procedure: TOTAL HIP ARTHROPLASTY;  Surgeon: Teryl Lucy, MD;  Location: [facility];  Service: Orthopedics;  Laterality: Right;   TOTAL HIP ARTHROPLASTY Left 03/24/2016   Procedure: TOTAL HIP ARTHROPLASTY;  Surgeon: Teryl Lucy, MD;  Location: [facility];  Service: Orthopedics;  Laterality:  Left;   Social History   Social History Narrative   Separated.   Works as a Advertising account planner.   Enjoys fishing, Publishing copy, golfing.   Immunization History  Administered Date(s) Administered   Influenza Split 01/17/2010   Influenza-Unspecified 01/17/2010   Moderna Sars-Covid-2 Vaccination 05/08/2020, 06/05/2020   Pneumococcal Polysaccharide-23 10/02/2008   Pneumococcal-Unspecified 10/02/2008   Tdap 01/17/2010, 06/25/2021   Zoster Recombinant(Shingrix) 07/01/2021, 08/14/2022     Objective: Vital Signs: There were no vitals taken for this visit.   Physical Exam   Musculoskeletal Exam: ***  CDAI Exam: CDAI Score: -- Patient Global: --; Provider Global: -- Swollen: --; Tender: -- Joint Exam 04/10/2024   No joint exam has been documented for this visit   There is currently no information documented on the homunculus. Go to the Rheumatology activity and complete the homunculus joint exam.  Investigation: No additional findings.  Imaging: No results found.  Recent Labs: Lab Results  Component Value Date   WBC 6.8 06/25/2021   HGB 14.8 06/25/2021   PLT 244.0 06/25/2021   NA 138 11/16/2023   K 4.0 11/16/2023   CL 101 11/16/2023   CO2 29 11/16/2023   GLUCOSE 80 11/16/2023   BUN 16 11/16/2023   CREATININE 1.03 11/16/2023   BILITOT 0.5 11/16/2023   ALKPHOS 72 11/16/2023   AST 19 11/16/2023   ALT 17 11/16/2023   PROT 6.9 11/16/2023   ALBUMIN 4.4 11/16/2023   CALCIUM 9.4 11/16/2023   GFRAA >60 03/27/2018   November 16, 2023 LDL 111, triglycerides 262, hemoglobin A1c 5.8, CRP<1.0, ESR 17, TSH 1.28, RF<10  Speciality Comments: No specialty comments available.  Procedures:  No procedures performed Allergies: Peanut-containing drug products and Erythromycin   Assessment / Plan:     Visit Diagnoses: No diagnosis found.  Orders: No orders of the defined types were placed in this encounter.  No orders of the defined types were placed in this  encounter.   Face-to-face time spent with patient was *** minutes. Greater than 50% of time was spent in counseling and coordination of care.  Follow-Up Instructions: No follow-ups on file.   Pollyann Savoy, MD  Note - This record has been created using Animal nutritionist.  Chart creation errors have been sought, but may not always  have been located. Such creation errors do not reflect on  the standard of medical care.

## 2024-04-10 ENCOUNTER — Encounter: Payer: 59 | Admitting: Rheumatology

## 2024-04-10 DIAGNOSIS — Z860100 Personal history of colon polyps, unspecified: Secondary | ICD-10-CM

## 2024-04-10 DIAGNOSIS — J449 Chronic obstructive pulmonary disease, unspecified: Secondary | ICD-10-CM

## 2024-04-10 DIAGNOSIS — I2510 Atherosclerotic heart disease of native coronary artery without angina pectoris: Secondary | ICD-10-CM

## 2024-04-10 DIAGNOSIS — F32A Depression, unspecified: Secondary | ICD-10-CM

## 2024-04-10 DIAGNOSIS — M79641 Pain in right hand: Secondary | ICD-10-CM

## 2024-04-10 DIAGNOSIS — R5383 Other fatigue: Secondary | ICD-10-CM

## 2024-04-10 DIAGNOSIS — F1020 Alcohol dependence, uncomplicated: Secondary | ICD-10-CM

## 2024-04-10 DIAGNOSIS — E782 Mixed hyperlipidemia: Secondary | ICD-10-CM

## 2024-04-10 DIAGNOSIS — I252 Old myocardial infarction: Secondary | ICD-10-CM

## 2024-04-10 DIAGNOSIS — E039 Hypothyroidism, unspecified: Secondary | ICD-10-CM

## 2024-04-10 DIAGNOSIS — M2550 Pain in unspecified joint: Secondary | ICD-10-CM

## 2024-04-10 DIAGNOSIS — G8929 Other chronic pain: Secondary | ICD-10-CM

## 2024-04-10 DIAGNOSIS — R7989 Other specified abnormal findings of blood chemistry: Secondary | ICD-10-CM

## 2024-04-10 DIAGNOSIS — E349 Endocrine disorder, unspecified: Secondary | ICD-10-CM

## 2024-04-10 DIAGNOSIS — I426 Alcoholic cardiomyopathy: Secondary | ICD-10-CM

## 2024-04-10 DIAGNOSIS — M545 Low back pain, unspecified: Secondary | ICD-10-CM

## 2024-04-10 DIAGNOSIS — R351 Nocturia: Secondary | ICD-10-CM

## 2024-05-10 ENCOUNTER — Ambulatory Visit: Payer: 59 | Admitting: Rheumatology

## 2024-09-11 ENCOUNTER — Other Ambulatory Visit: Payer: Self-pay | Admitting: Primary Care

## 2024-09-11 DIAGNOSIS — F32A Depression, unspecified: Secondary | ICD-10-CM

## 2024-09-11 NOTE — Telephone Encounter (Signed)
Patient is due for CPE/follow up in late November, this will be required prior to any further refills.  Please schedule, thank you!

## 2024-09-11 NOTE — Telephone Encounter (Signed)
 Lvm  for pt to call office schedule a appt for cpe

## 2024-09-15 ENCOUNTER — Other Ambulatory Visit: Payer: Self-pay | Admitting: Cardiovascular Disease

## 2024-09-15 DIAGNOSIS — I10 Essential (primary) hypertension: Secondary | ICD-10-CM

## 2024-09-20 NOTE — Telephone Encounter (Signed)
 Lvm to schedule CPE in Late November to receive further refills.

## 2024-10-14 ENCOUNTER — Other Ambulatory Visit: Payer: Self-pay | Admitting: Cardiovascular Disease

## 2024-10-14 DIAGNOSIS — I10 Essential (primary) hypertension: Secondary | ICD-10-CM

## 2024-10-17 NOTE — Telephone Encounter (Signed)
 Please contact pt for future appointment. Pt due for follow up.

## 2024-10-17 NOTE — Telephone Encounter (Signed)
 Left voice mail

## 2024-10-23 NOTE — Telephone Encounter (Signed)
 Pt is scheduled on 12/08/24.

## 2024-11-29 ENCOUNTER — Encounter: Payer: Self-pay | Admitting: Primary Care

## 2024-11-29 ENCOUNTER — Ambulatory Visit: Admitting: Primary Care

## 2024-11-29 ENCOUNTER — Ambulatory Visit: Payer: Self-pay | Admitting: Primary Care

## 2024-11-29 VITALS — BP 164/82 | HR 90 | Temp 98.7°F | Ht 71.0 in | Wt 171.4 lb

## 2024-11-29 DIAGNOSIS — F419 Anxiety disorder, unspecified: Secondary | ICD-10-CM

## 2024-11-29 DIAGNOSIS — R7303 Prediabetes: Secondary | ICD-10-CM

## 2024-11-29 DIAGNOSIS — E785 Hyperlipidemia, unspecified: Secondary | ICD-10-CM

## 2024-11-29 DIAGNOSIS — Z125 Encounter for screening for malignant neoplasm of prostate: Secondary | ICD-10-CM

## 2024-11-29 DIAGNOSIS — I10 Essential (primary) hypertension: Secondary | ICD-10-CM

## 2024-11-29 DIAGNOSIS — E039 Hypothyroidism, unspecified: Secondary | ICD-10-CM

## 2024-11-29 DIAGNOSIS — Z0000 Encounter for general adult medical examination without abnormal findings: Secondary | ICD-10-CM

## 2024-11-29 DIAGNOSIS — F1020 Alcohol dependence, uncomplicated: Secondary | ICD-10-CM

## 2024-11-29 DIAGNOSIS — Z860100 Personal history of colon polyps, unspecified: Secondary | ICD-10-CM

## 2024-11-29 DIAGNOSIS — J449 Chronic obstructive pulmonary disease, unspecified: Secondary | ICD-10-CM

## 2024-11-29 LAB — COMPREHENSIVE METABOLIC PANEL WITH GFR
ALT: 41 U/L (ref 0–53)
AST: 49 U/L — ABNORMAL HIGH (ref 0–37)
Albumin: 4.4 g/dL (ref 3.5–5.2)
Alkaline Phosphatase: 78 U/L (ref 39–117)
BUN: 16 mg/dL (ref 6–23)
CO2: 30 meq/L (ref 19–32)
Calcium: 9.4 mg/dL (ref 8.4–10.5)
Chloride: 102 meq/L (ref 96–112)
Creatinine, Ser: 0.88 mg/dL (ref 0.40–1.50)
GFR: 93.72 mL/min (ref >60.00)
Glucose, Bld: 98 mg/dL (ref 70–99)
Potassium: 3.8 meq/L (ref 3.5–5.1)
Sodium: 139 meq/L (ref 135–145)
Total Bilirubin: 0.6 mg/dL (ref 0.2–1.2)
Total Protein: 6.9 g/dL (ref 6.0–8.3)

## 2024-11-29 LAB — LIPID PANEL
Cholesterol: 221 mg/dL — ABNORMAL HIGH (ref 0–200)
HDL: 66.6 mg/dL (ref >39.00)
LDL Cholesterol: 109 mg/dL — ABNORMAL HIGH (ref 0–99)
NonHDL: 154.48
Total CHOL/HDL Ratio: 3
Triglycerides: 225 mg/dL — ABNORMAL HIGH (ref 0.0–149.0)
VLDL: 45 mg/dL — ABNORMAL HIGH (ref 0.0–40.0)

## 2024-11-29 LAB — HEMOGLOBIN A1C: Hgb A1c MFr Bld: 5.7 % (ref 4.6–6.5)

## 2024-11-29 LAB — TSH: TSH: 1.55 u[IU]/mL (ref 0.35–5.50)

## 2024-11-29 LAB — PSA: PSA: 0.34 ng/mL (ref 0.10–4.00)

## 2024-11-29 NOTE — Assessment & Plan Note (Signed)
 Immunizations UTD. Declines influenza vaccine.  Colonoscopy UTD, due 2028 PSA due and pending.  Discussed the importance of a healthy diet and regular exercise in order for weight loss, and to reduce the risk of further co-morbidity.  Exam stable. Labs pending.  Follow up in 1 year for repeat physical.

## 2024-11-29 NOTE — Assessment & Plan Note (Signed)
 Blood count today, also on recheck. He has yet to take his medications today and also resumed all medications as of 1 month ago.  We discussed that his blood pressure should be close to goal by now.  He will meet with cardiology next week.  Continue lisinopril  20 mg daily, carvedilol  6.25 mg twice daily.  CMP pending.

## 2024-11-29 NOTE — Assessment & Plan Note (Signed)
 With relapse over the last year.  Commended him on his 1 month sobriety. Continue with AA.

## 2024-11-29 NOTE — Assessment & Plan Note (Signed)
Controlled.  No concerns today. Continue Lexapro 10 mg daily.

## 2024-11-29 NOTE — Assessment & Plan Note (Signed)
 Historically normal for years.  Repeat TSH pending. Remain off treatment.

## 2024-11-29 NOTE — Patient Instructions (Signed)
 Stop by the lab prior to leaving today. I will notify you of your results once received.   Continue all of your prescribed medications.  It was a pleasure to see you today!

## 2024-11-29 NOTE — Progress Notes (Signed)
 Subjective:    Patient ID: Kurt CHRISTELLA Honora Mickey., male    DOB: 12-24-1966, 58 y.o.   MRN: [id number]  Kurt Bart. is a very pleasant 58 y.o. male who presents today for complete physical and follow up of chronic conditions.  Immunizations: -Tetanus: Completed in 2022 -Influenza: Declines influenza vaccine.  -Shingles: Completed Shingrix  series -Pneumonia: Completed last in 2009  Diet: Fair diet.  Exercise: No regular exercise.  Eye exam: Completes annually  Dental exam: Completes semi-annually    Colonoscopy: Completed in 2025, due 2028  PSA: Due  BP Readings from Last 3 Encounters:  11/29/24 (!) 164/82  01/20/24 (!) 148/99  11/16/23 128/76     He does not check BP at home. He stopped all of his medications about 1 month ago.     Review of Systems  Constitutional:  Negative for unexpected weight change.  HENT:  Negative for rhinorrhea.   Respiratory:  Negative for cough and shortness of breath.   Cardiovascular:  Negative for chest pain.  Gastrointestinal:  Negative for constipation and diarrhea.  Genitourinary:  Negative for difficulty urinating.  Musculoskeletal:  Negative for arthralgias and myalgias.  Skin:  Negative for rash.  Allergic/Immunologic: Negative for environmental allergies.  Neurological:  Negative for dizziness and headaches.  Psychiatric/Behavioral:  The patient is not nervous/anxious.          Past Medical History:  Diagnosis Date   Allergy    mild   Anginal pain    Anxiety    Arthritis    maybe in hands    Avascular necrosis of bone of left hip (HCC) 03/24/2016   Avascular necrosis of bone of right hip (HCC) 01/14/2016   Avascular necrosis of hip (HCC) 01/14/2016   Bowel habit changes 03/12/2020   COPD (chronic obstructive pulmonary disease) (HCC)    Coronary artery disease    Coronary disease 09/12/2015   Depression    General patient noncompliance 03/29/2013   History of MI (myocardial infarction) 09/12/2015    Hyperlipidemia    Hypertension    Internal hemorrhoids    Malnutrition of moderate degree 03/28/2016   Myocardial infarction (HCC)    20 yrs ago    Pituitary abnormality    Postoperative anemia due to acute blood loss 03/27/2016   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(HCC)    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   Thyroid  disease    reports that he has been low in the past but since he has stoped ETOH it has been normal    Social History   Socioeconomic History   Marital status: Legally Separated    Spouse name: Not on file   Number of children: Not on file   Years of education: Not on file   Highest education level: Not on file  Occupational History   Not on file  Tobacco Use   Smoking status: Former    Current packs/day: 0.00    Average packs/day: 1.5 packs/day for 20.0 years (30.0 ttl pk-yrs)    Types: Cigarettes    Start date: 11/25/1995    Quit date: 11/25/2015    Years since quitting: 9.0   Smokeless tobacco: Never  Vaping Use   Vaping status: Never Used  Substance and Sexual Activity   Alcohol use: No    Alcohol/week: 0.0 standard drinks of alcohol    Comment: 4 months-recovering alcoholic per pt   Drug use: No   Sexual activity: Not on file  Other Topics Concern   Not on  file  Social History Narrative   Separated.   Works as a advertising account planner.   Enjoys fishing, publishing copy, golfing.   Social Drivers of Corporate Investment Banker Strain: Not on file  Food Insecurity: Not on file  Transportation Needs: Not on file  Physical Activity: Not on file  Stress: Not on file  Social Connections: Not on file  Intimate Partner Violence: Not on file    Past Surgical History:  Procedure Laterality Date   BIOPSY  12/05/2019   Procedure: BIOPSY;  Surgeon: San Sandor GAILS, DO;  Location: [facility];  Service: Gastroenterology;;   CARDIAC CATHETERIZATION     many years ago in 1990's   COLONOSCOPY  03/02/2019   COLONOSCOPY WITH PROPOFOL  N/A 12/05/2019    Procedure: COLONOSCOPY WITH PROPOFOL ;  Surgeon: San Sandor GAILS, DO;  Location: [facility];  Service: Gastroenterology;  Laterality: N/A;   CORONARY STENT PLACEMENT     TONSILLECTOMY     as a child   TOTAL HIP ARTHROPLASTY Right 01/14/2016   Procedure: TOTAL HIP ARTHROPLASTY;  Surgeon: Fonda Olmsted, MD;  Location: [facility];  Service: Orthopedics;  Laterality: Right;   TOTAL HIP ARTHROPLASTY Left 03/24/2016   Procedure: TOTAL HIP ARTHROPLASTY;  Surgeon: Fonda Olmsted, MD;  Location: [facility];  Service: Orthopedics;  Laterality: Left;    Family History  Problem Relation Age of Onset   Ovarian cancer Mother    Stomach cancer Mother    Cancer Mother    Dementia Father    Colon cancer Brother 60   Cancer Maternal Grandmother    Cancer Maternal Grandfather    Cancer Paternal Grandmother    Cancer Paternal Grandfather    Colon polyps Neg Hx    Esophageal cancer Neg Hx    Rectal cancer Neg Hx     Allergies  Allergen Reactions   Peanut-Containing Drug Products Anaphylaxis   Erythromycin Other (See Comments)    Unknown childhood allergy    Current Outpatient Medications on File Prior to Visit  Medication Sig Dispense Refill   aspirin  EC 81 MG tablet Take 1 tablet (81 mg total) by mouth daily. 90 tablet 3   atorvastatin  (LIPITOR) 80 MG tablet Take 1 tablet (80 mg total) by mouth daily. For cholesterol 90 tablet 3   buPROPion  (WELLBUTRIN  XL) 150 MG 24 hr tablet TAKE 1 TABLET BY MOUTH ONCE DAILY FOR ANXIETY AND FOR DEPRESSION 90 tablet 2   carvedilol  (COREG ) 6.25 MG tablet Take 1 tablet (6.25 mg total) by mouth 2 (two) times daily with a meal. 180 tablet 3   escitalopram  (LEXAPRO ) 10 MG tablet TAKE 1 TABLET BY MOUTH ONCE DAILY FOR ANXIETY AND FOR DEPRESSION 90 tablet 0   ezetimibe  (ZETIA ) 10 MG tablet TAKE 1 TABLET BY MOUTH ONCE DAILY FOR  CHOLESTEROL. KEEP FUTURE APPOINTMENT FOR MORE REFILLS. 90 tablet 2   lisinopril  (ZESTRIL ) 20 MG tablet Take 1 tablet (20 mg total) by mouth daily. 15  tablet 0   Multiple Vitamin (MULTIVITAMIN WITH MINERALS) TABS tablet Take 1 tablet by mouth daily.     nitroGLYCERIN  (NITROSTAT ) 0.4 MG SL tablet Place 1 tablet (0.4 mg total) under the tongue every 5 (five) minutes as needed for chest pain. 100 tablet 1   Omega-3 Fatty Acids (FISH OIL) 1000 MG CAPS Take 1,000 mg by mouth daily.      VENTOLIN  HFA 108 (90 Base) MCG/ACT inhaler Inhale 2 puffs into the lungs every 4 (four) hours as needed for wheezing or shortness of breath.  18 g 3   No current facility-administered medications on file prior to visit.    BP (!) 164/82   Pulse 90   Temp 98.7 F (37.1 C) (Oral)   Ht 5' 11 (1.803 m)   Wt 171 lb 6 oz (77.7 kg)   SpO2 98%   BMI 23.90 kg/m  Objective:   Physical Exam HENT:     Right Ear: Tympanic membrane and ear canal normal.     Left Ear: Tympanic membrane and ear canal normal.  Eyes:     Pupils: Pupils are equal, round, and reactive to light.  Cardiovascular:     Rate and Rhythm: Normal rate and regular rhythm.  Pulmonary:     Effort: Pulmonary effort is normal.     Breath sounds: Normal breath sounds.  Abdominal:     General: Bowel sounds are normal.     Palpations: Abdomen is soft.     Tenderness: There is no abdominal tenderness.  Musculoskeletal:        General: Normal range of motion.     Cervical back: Neck supple.  Skin:    General: Skin is warm and dry.  Neurological:     Mental Status: He is alert and oriented to person, place, and time.     Cranial Nerves: No cranial nerve deficit.     Deep Tendon Reflexes:     Reflex Scores:      Patellar reflexes are 2+ on the right side and 2+ on the left side. Psychiatric:        Mood and Affect: Mood normal.     Physical Exam        Assessment & Plan:  Screening for prostate cancer -     PSA  Prediabetes -     Hemoglobin A1c  Hyperlipidemia, unspecified hyperlipidemia type Assessment & Plan: Repeat lipid panel pending. Do suspect that lipids will be slightly  abnormal as he resumed all medications 1 month ago.  Continue atorvastatin  80 mg daily, Zetia  10 mg daily. Follow-up with cardiology.  Orders: -     Lipid panel -     Comprehensive metabolic panel with GFR  Acquired hypothyroidism Assessment & Plan: Historically normal for years.  Repeat TSH pending. Remain off treatment.  Orders: -     TSH  Benign essential HTN Assessment & Plan: Blood count today, also on recheck. He has yet to take his medications today and also resumed all medications as of 1 month ago.  We discussed that his blood pressure should be close to goal by now.  He will meet with cardiology next week.  Continue lisinopril  20 mg daily, carvedilol  6.25 mg twice daily.  CMP pending.   Chronic obstructive pulmonary disease, unspecified COPD type (HCC) Assessment & Plan: Asymptomatic, no concerns today.  Continue albuterol  inhaler as needed.   History of colonic polyps Assessment & Plan: Colonoscopy up-to-date, due in 2028   Preventative health care Assessment & Plan: Immunizations UTD.  Declines influenza vaccine Colonoscopy UTD, due 2028 PSA due and pending.  Discussed the importance of a healthy diet and regular exercise in order for weight loss, and to reduce the risk of further co-morbidity.  Exam stable. Labs pending.  Follow up in 1 year for repeat physical.    Alcoholism [facility]) Assessment & Plan: With relapse over the last year.  Commended him on his 1 month sobriety. Continue with AA.   Anxiety and depression Assessment & Plan: Controlled.  No concerns today.  Continue Lexapro  10  mg daily.     Assessment and Plan Assessment & Plan      Above   Comer MARLA Gaskins, NP     History of Present Illness

## 2024-11-29 NOTE — Assessment & Plan Note (Signed)
 Asymptomatic, no concerns today.  Continue albuterol  inhaler as needed.

## 2024-11-29 NOTE — Assessment & Plan Note (Signed)
 Colonoscopy up-to-date, due in 2028

## 2024-11-29 NOTE — Assessment & Plan Note (Signed)
 Repeat lipid panel pending. Do suspect that lipids will be slightly abnormal as he resumed all medications 1 month ago.  Continue atorvastatin  80 mg daily, Zetia  10 mg daily. Follow-up with cardiology.

## 2024-12-08 ENCOUNTER — Ambulatory Visit: Attending: Cardiovascular Disease | Admitting: Cardiovascular Disease

## 2024-12-08 ENCOUNTER — Encounter: Payer: Self-pay | Admitting: Cardiovascular Disease

## 2024-12-08 VITALS — BP 148/72 | HR 59 | Ht 72.0 in | Wt 180.2 lb

## 2024-12-08 DIAGNOSIS — E782 Mixed hyperlipidemia: Secondary | ICD-10-CM

## 2024-12-08 DIAGNOSIS — I10 Essential (primary) hypertension: Secondary | ICD-10-CM | POA: Diagnosis not present

## 2024-12-08 DIAGNOSIS — I2510 Atherosclerotic heart disease of native coronary artery without angina pectoris: Secondary | ICD-10-CM

## 2024-12-08 DIAGNOSIS — E785 Hyperlipidemia, unspecified: Secondary | ICD-10-CM | POA: Diagnosis not present

## 2024-12-08 DIAGNOSIS — I426 Alcoholic cardiomyopathy: Secondary | ICD-10-CM | POA: Diagnosis not present

## 2024-12-08 DIAGNOSIS — J449 Chronic obstructive pulmonary disease, unspecified: Secondary | ICD-10-CM

## 2024-12-08 MED ORDER — EZETIMIBE 10 MG PO TABS: 10.0000 mg | ORAL_TABLET | Freq: Every day | ORAL | 3 refills | Status: AC

## 2024-12-08 MED ORDER — LISINOPRIL 20 MG PO TABS: 20.0000 mg | ORAL_TABLET | Freq: Every day | ORAL | 3 refills | Status: AC

## 2024-12-08 MED ORDER — ATORVASTATIN CALCIUM 80 MG PO TABS: 80.0000 mg | ORAL_TABLET | Freq: Every day | ORAL | 3 refills | Status: AC

## 2024-12-08 MED ORDER — METOPROLOL SUCCINATE ER 25 MG PO TB24: 25.0000 mg | ORAL_TABLET | Freq: Every day | ORAL | 3 refills | Status: AC

## 2024-12-08 NOTE — Progress Notes (Signed)
 Cardiology Office Note  Date:  12/08/2024   ID:  Kurt [name]., DOB 1966-08-25, MRN [id number]  PCP:  Gretta Comer POUR, NP   Chief Complaint  Patient presents with   12 month follow up     Patient denies chest pain or shortness of breath.     HPI:  Kurt [name]. is a 58 y.o. male with past medical history of: Past Medical History:  Diagnosis Date   Allergy    mild   Anginal pain    Anxiety    Arthritis    maybe in hands    Avascular necrosis of bone of left hip (HCC) 03/24/2016   Avascular necrosis of bone of right hip (HCC) 01/14/2016   Avascular necrosis of hip (HCC) 01/14/2016   Bowel habit changes 03/12/2020   COPD (chronic obstructive pulmonary disease) (HCC)    Coronary artery disease    Coronary disease 09/12/2015   Depression    General patient noncompliance 03/29/2013   History of MI (myocardial infarction) 09/12/2015   Hyperlipidemia    Hypertension    Internal hemorrhoids    Malnutrition of moderate degree 03/28/2016   Myocardial infarction (HCC)    20 yrs ago    Pituitary abnormality    Postoperative anemia due to acute blood loss 03/27/2016   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(HCC)    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 Echo in 09/2020 showed LVEF 50-55%. He stopped drinking and smoking in 2021.   In follow-up today reports he has been off his Lipitor Zetia  and some of his other medications Repeat lipid panel showed increase in total cholesterol 220 up from 160s  Takes carvedilol  once a day, Typically asked to take all his medications in the evening  On discussion of smoking cessation, reports he is on and off, still  smoking at times  Denies chest pain concerning for angina  EKG personally reviewed by myself on todays visit EKG Interpretation Date/Time:  Friday December 08 2024 08:57:47 EST Ventricular Rate:  59 PR Interval:  132 QRS Duration:  116 QT Interval:  464 QTC Calculation: 459 R Axis:   98  Text Interpretation: Sinus bradycardia Rightward axis When compared with ECG of 28-Jul-2023 08:05, No significant change was found Confirmed by Perla Lye [phone]) on 12/08/2024 9:26:07 AM    PMH:   has a past medical history of Allergy, Anginal pain, Anxiety, Arthritis, Avascular necrosis of bone of left hip (HCC) (03/24/2016), Avascular necrosis of bone of right hip (HCC) (01/14/2016), Avascular necrosis of hip (HCC) (01/14/2016), Bowel habit changes (03/12/2020), COPD (chronic obstructive pulmonary disease) (HCC), Coronary artery disease, Coronary disease (09/12/2015), Depression, General patient noncompliance (03/29/2013), History of MI (myocardial infarction) (09/12/2015), Hyperlipidemia, Hypertension, Internal hemorrhoids, Malnutrition of moderate degree (03/28/2016), Myocardial infarction [facility]), Pituitary abnormality, Postoperative anemia due to acute blood loss (03/27/2016), 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(HCC), S/P total hip arthroplasty (03/24/2016), and Thyroid  disease.   PSH:    Past Surgical History:  Procedure Laterality Date   BIOPSY  12/05/2019   Procedure: BIOPSY;  Surgeon: San Sandor GAILS, DO;  Location: [facility];  Service: Gastroenterology;;   CARDIAC CATHETERIZATION     many years ago in 1990's   COLONOSCOPY  03/02/2019   COLONOSCOPY WITH PROPOFOL  N/A 12/05/2019   Procedure: COLONOSCOPY WITH PROPOFOL ;  Surgeon: San Sandor GAILS, DO;  Location: [facility];  Service: Gastroenterology;  Laterality: N/A;   CORONARY STENT PLACEMENT     TONSILLECTOMY     as a child   TOTAL HIP ARTHROPLASTY Right 01/14/2016   Procedure: TOTAL HIP ARTHROPLASTY;  Surgeon: Fonda Olmsted, MD;   Location: [facility];  Service: Orthopedics;  Laterality: Right;   TOTAL HIP ARTHROPLASTY Left 03/24/2016   Procedure: TOTAL HIP ARTHROPLASTY;  Surgeon: Fonda Olmsted, MD;  Location: [facility];  Service: Orthopedics;  Laterality: Left;    Current Outpatient Medications  Medication Sig Dispense Refill   aspirin  EC 81 MG tablet Take 1 tablet (81 mg total) by mouth daily. 90 tablet 3   atorvastatin  (LIPITOR) 80 MG tablet Take 1 tablet (80 mg total) by mouth daily. For cholesterol 90 tablet 3   buPROPion  (WELLBUTRIN  XL) 150 MG 24 hr tablet TAKE 1 TABLET BY MOUTH ONCE DAILY FOR ANXIETY AND FOR DEPRESSION 90 tablet 2   carvedilol  (COREG ) 6.25 MG tablet Take 1 tablet (6.25 mg total) by mouth 2 (two) times daily with a meal. 180 tablet 3   escitalopram  (LEXAPRO ) 10 MG tablet TAKE 1 TABLET BY MOUTH ONCE DAILY FOR ANXIETY AND FOR DEPRESSION 90 tablet 0   ezetimibe  (ZETIA ) 10 MG tablet TAKE 1 TABLET BY MOUTH ONCE DAILY FOR  CHOLESTEROL. KEEP FUTURE APPOINTMENT FOR MORE REFILLS. 90 tablet 2   lisinopril  (ZESTRIL ) 20 MG tablet Take 1 tablet (20 mg total) by mouth daily. 15 tablet 0   Multiple Vitamin (MULTIVITAMIN WITH MINERALS) TABS tablet Take 1 tablet by mouth daily.     nitroGLYCERIN  (NITROSTAT ) 0.4 MG SL tablet Place 1 tablet (0.4 mg total) under the tongue every 5 (five) minutes as needed for chest pain. 100 tablet 1   Omega-3 Fatty Acids (FISH OIL) 1000 MG CAPS Take 1,000 mg by mouth daily.      VENTOLIN  HFA 108 (90 Base) MCG/ACT inhaler Inhale 2 puffs into the lungs every 4 (four) hours as needed for wheezing or shortness of breath. 18 g 3   No current facility-administered medications for this visit.     Allergies:   Peanut-containing drug products and Erythromycin   Social History:  The patient  reports that he has been smoking cigarettes. He started smoking about 29 years ago. He has a 30 pack-year smoking history. He has never used smokeless tobacco. He reports that he does not drink alcohol and does  not use drugs.   Family History:   family history includes Cancer in his maternal grandfather, maternal grandmother, mother, paternal grandfather, and paternal grandmother; Colon cancer (age of onset: 61) in his brother; Dementia in his father; Ovarian cancer in his mother; Stomach cancer in his mother.    Review of Systems: Review of Systems  Constitutional: Negative.   HENT: Negative.    Respiratory: Negative.    Cardiovascular: Negative.   Gastrointestinal: Negative.   Musculoskeletal: Negative.   Neurological: Negative.   Psychiatric/Behavioral: Negative.    All other systems reviewed and are negative.   PHYSICAL EXAM: VS:  BP (!) 148/72 (BP Location: Left Arm, Patient Position: Sitting, Cuff Size: Normal)   Pulse (!) 59   Ht 6' (1.829 m)   Wt 180 lb 4 oz (81.8 kg)   SpO2 98%  BMI 24.45 kg/m  , BMI Body mass index is 24.45 kg/m. GEN: Well nourished, well developed, in no acute distress HEENT: normal Neck: no JVD, carotid bruits, or masses Cardiac: RRR; no murmurs, rubs, or gallops,no edema  Respiratory:  clear to auscultation bilaterally, normal work of breathing GI: soft, nontender, nondistended, + BS MS: no deformity or atrophy Skin: warm and dry, no rash Neuro:  Strength and sensation are intact Psych: euthymic mood, full affect  Recent Labs: 11/29/2024: ALT 41; BUN 16; Creatinine, Ser 0.88; Potassium 3.8; Sodium 139; TSH 1.55    Lipid Panel Lab Results  Component Value Date   CHOL 221 (H) 11/29/2024   HDL 66.60 11/29/2024   LDLCALC 109 (H) 11/29/2024   TRIG 225.0 (H) 11/29/2024      Wt Readings from Last 3 Encounters:  12/08/24 180 lb 4 oz (81.8 kg)  11/29/24 171 lb 6 oz (77.7 kg)  01/20/24 180 lb (81.6 kg)     ASSESSMENT AND PLAN:  Problem List Items Addressed This Visit       Cardiology Problems   Arteriosclerosis of coronary artery - Primary (Chronic)   Relevant Orders   EKG 12-Lead (Completed)   Benign essential HTN   Relevant Orders    EKG 12-Lead (Completed)   HLD (hyperlipidemia)   Alcoholic cardiomyopathy (HCC)   Relevant Orders   EKG 12-Lead (Completed)     Other   COPD (chronic obstructive pulmonary disease) (HCC)   Relevant Orders   EKG 12-Lead (Completed)   Coronary artery disease with stable angina Currently with no symptoms of angina. No further workup at this time. Continue current medication regimen. Smoking cessation recommended Recommend compliance with his atorvastatin  and Zetia   Smoking We have encouraged him to continue to work on weaning his cigarettes and smoking cessation. He will continue to work on this  Alcohol abuse Reports he quit alcohol several years ago  Essential hypertension Reports medication noncompliance, also taking carvedilol  once a day Recommend we change carvedilol  to metoprolol succinate 25 daily continue lisinopril  20 daily Would recommend monitoring blood pressure at home and call us  with numbers If blood pressure continues to run high, we could increase lisinopril  dosing   Signed, Velinda Lunger, M.D., Ph.D. [facility] [place], [place] [phone]

## 2024-12-08 NOTE — Patient Instructions (Addendum)
 Medication Instructions:  Please stop Coreg   Start metoprolol 25 mg one a day in the evening with other medications  If you need a refill on your cardiac medications before your next appointment, please call your pharmacy.   Lab work: No new labs needed  Testing/Procedures: No new testing needed  Follow-Up: At [facility], you and your health needs are our priority.  As part of our continuing mission to provide you with exceptional heart care, we have created designated Provider Care Teams.  These Care Teams include your primary Cardiologist (physician) and Advanced Practice Providers (APPs -  Physician Assistants and Nurse Practitioners) who all work together to provide you with the care you need, when you need it.  You will need a follow up appointment in 12 months  Providers on your designated Care Team:   Lonni Meager, NP Bernardino Bring, PA-C Cadence [place], [place]  COVID-19 Vaccine Information can be found at: [email or web address] For questions related to vaccine distribution or appointments, please email [email or web address] or call [phone].

## 2024-12-10 ENCOUNTER — Other Ambulatory Visit: Payer: Self-pay | Admitting: Primary Care

## 2024-12-10 DIAGNOSIS — F419 Anxiety disorder, unspecified: Secondary | ICD-10-CM

## 2024-12-24 ENCOUNTER — Other Ambulatory Visit: Payer: Self-pay | Admitting: Primary Care

## 2024-12-24 DIAGNOSIS — F419 Anxiety disorder, unspecified: Secondary | ICD-10-CM

## 2025-02-06 ENCOUNTER — Other Ambulatory Visit
